# Patient Record
Sex: Female | Born: 1960 | Race: Black or African American | Hispanic: No | Smoking: Former smoker
Health system: Southern US, Community
[De-identification: ages and names within clinical notes are randomized; demographics above are authoritative.]

## PROBLEM LIST (undated history)

## (undated) ENCOUNTER — Emergency Department (HOSPITAL_COMMUNITY): Admission: EM | Payer: Self-pay | Source: Home / Self Care

## (undated) DIAGNOSIS — F329 Major depressive disorder, single episode, unspecified: Secondary | ICD-10-CM

## (undated) DIAGNOSIS — F32A Depression, unspecified: Secondary | ICD-10-CM

## (undated) DIAGNOSIS — M1990 Unspecified osteoarthritis, unspecified site: Secondary | ICD-10-CM

## (undated) DIAGNOSIS — E119 Type 2 diabetes mellitus without complications: Secondary | ICD-10-CM

## (undated) DIAGNOSIS — I10 Essential (primary) hypertension: Secondary | ICD-10-CM

## (undated) HISTORY — DX: Major depressive disorder, single episode, unspecified: F32.9

## (undated) HISTORY — DX: Unspecified osteoarthritis, unspecified site: M19.90

## (undated) HISTORY — DX: Depression, unspecified: F32.A

## (undated) HISTORY — DX: Type 2 diabetes mellitus without complications: E11.9

---

## 1999-10-24 ENCOUNTER — Encounter: Admission: RE | Admit: 1999-10-24 | Discharge: 2000-01-22 | Payer: Self-pay | Admitting: Family Medicine

## 2000-07-28 ENCOUNTER — Other Ambulatory Visit: Admission: RE | Admit: 2000-07-28 | Discharge: 2000-07-28 | Payer: Self-pay | Admitting: *Deleted

## 2001-09-09 ENCOUNTER — Other Ambulatory Visit: Admission: RE | Admit: 2001-09-09 | Discharge: 2001-09-09 | Payer: Self-pay | Admitting: *Deleted

## 2002-10-27 ENCOUNTER — Other Ambulatory Visit: Admission: RE | Admit: 2002-10-27 | Discharge: 2002-10-27 | Payer: Self-pay | Admitting: *Deleted

## 2006-10-01 ENCOUNTER — Emergency Department (HOSPITAL_COMMUNITY): Admission: EM | Admit: 2006-10-01 | Discharge: 2006-10-02 | Payer: Self-pay | Admitting: Emergency Medicine

## 2006-10-25 ENCOUNTER — Ambulatory Visit (HOSPITAL_COMMUNITY): Admission: RE | Admit: 2006-10-25 | Discharge: 2006-10-25 | Payer: Self-pay | Admitting: Family Medicine

## 2006-11-04 ENCOUNTER — Encounter: Admission: RE | Admit: 2006-11-04 | Discharge: 2007-02-02 | Payer: Self-pay | Admitting: Family Medicine

## 2007-05-16 ENCOUNTER — Emergency Department (HOSPITAL_COMMUNITY): Admission: EM | Admit: 2007-05-16 | Discharge: 2007-05-16 | Payer: Self-pay | Admitting: Family Medicine

## 2010-07-22 ENCOUNTER — Encounter: Payer: Self-pay | Admitting: Family Medicine

## 2016-02-28 ENCOUNTER — Ambulatory Visit (INDEPENDENT_AMBULATORY_CARE_PROVIDER_SITE_OTHER): Payer: 59 | Admitting: Physician Assistant

## 2016-02-28 ENCOUNTER — Ambulatory Visit (HOSPITAL_COMMUNITY)
Admission: RE | Admit: 2016-02-28 | Discharge: 2016-02-28 | Disposition: A | Discharge status: Home / Self Care | Payer: 59 | Source: Ambulatory Visit | Attending: Physician Assistant | Admitting: Physician Assistant

## 2016-02-28 VITALS — BP 144/80 | HR 100 | Temp 98.0°F | Resp 17 | Ht 64.75 in | Wt 146.0 lb

## 2016-02-28 DIAGNOSIS — R531 Weakness: Secondary | ICD-10-CM | POA: Insufficient documentation

## 2016-02-28 DIAGNOSIS — R1031 Right lower quadrant pain: Secondary | ICD-10-CM | POA: Insufficient documentation

## 2016-02-28 DIAGNOSIS — K381 Appendicular concretions: Secondary | ICD-10-CM | POA: Diagnosis not present

## 2016-02-28 DIAGNOSIS — R600 Localized edema: Secondary | ICD-10-CM | POA: Diagnosis not present

## 2016-02-28 DIAGNOSIS — R81 Glycosuria: Secondary | ICD-10-CM | POA: Diagnosis not present

## 2016-02-28 LAB — POCT CBC
GRANULOCYTE PERCENT: 81.5 % — AB (ref 37–80)
HEMATOCRIT: 40.6 % (ref 37.7–47.9)
Hemoglobin: 14.4 g/dL (ref 12.2–16.2)
Lymph, poc: 1.8 (ref 0.6–3.4)
MCH, POC: 30.5 pg (ref 27–31.2)
MCHC: 35.5 g/dL — AB (ref 31.8–35.4)
MCV: 85.8 fL (ref 80–97)
MID (CBC): 0.8 (ref 0–0.9)
MPV: 9.5 fL (ref 0–99.8)
POC GRANULOCYTE: 11.2 — AB (ref 2–6.9)
POC LYMPH %: 13 % (ref 10–50)
POC MID %: 5.5 % (ref 0–12)
Platelet Count, POC: 252 10*3/uL (ref 142–424)
RBC: 4.47 M/uL (ref 4.04–5.48)
RDW, POC: 12.4 %
WBC: 13.8 10*3/uL — AB (ref 4.6–10.2)

## 2016-02-28 LAB — POCT URINALYSIS DIP (MANUAL ENTRY)
Leukocytes, UA: NEGATIVE
Nitrite, UA: NEGATIVE
Protein Ur, POC: 100 — AB
SPEC GRAV UA: 1.015
Urobilinogen, UA: 1
pH, UA: 5.5

## 2016-02-28 LAB — BASIC METABOLIC PANEL
ANION GAP: 17 — AB (ref 5–15)
BUN: 24 mg/dL — ABNORMAL HIGH (ref 6–20)
CALCIUM: 10 mg/dL (ref 8.9–10.3)
CO2: 24 mmol/L (ref 22–32)
Chloride: 88 mmol/L — ABNORMAL LOW (ref 101–111)
Creatinine, Ser: 1.03 mg/dL — ABNORMAL HIGH (ref 0.44–1.00)
GFR, EST NON AFRICAN AMERICAN: 60 mL/min — AB (ref >60)
Glucose, Bld: 314 mg/dL — ABNORMAL HIGH (ref 65–99)
POTASSIUM: 3.4 mmol/L — AB (ref 3.5–5.1)
SODIUM: 129 mmol/L — AB (ref 135–145)

## 2016-02-28 LAB — POCT GLYCOSYLATED HEMOGLOBIN (HGB A1C)

## 2016-02-28 LAB — GLUCOSE, POCT (MANUAL RESULT ENTRY): POC Glucose: 297 mg/dl — AB (ref 70–99)

## 2016-02-28 LAB — POCT URINE PREGNANCY: PREG TEST UR: NEGATIVE

## 2016-02-28 MED ORDER — IOPAMIDOL (ISOVUE-300) INJECTION 61%
INTRAVENOUS | Status: AC
  Filled 2016-02-28: qty 30

## 2016-02-28 MED ORDER — IOPAMIDOL (ISOVUE-300) INJECTION 61%
INTRAVENOUS | Status: AC
  Administered 2016-02-28: 100 mL
  Filled 2016-02-28: qty 100

## 2016-02-28 NOTE — Patient Instructions (Addendum)
     IF you received an x-ray today, you will receive an invoice from [facility]. Please contact [facility] at [phone] with questions or concerns regarding your invoice.   IF you received labwork today, you will receive an invoice from [facility]. Please contact [facility] at [phone] with questions or concerns regarding your invoice.   Our billing staff will not be able to assist you with questions regarding bills from these companies.  You will be contacted with the lab results as soon as they are available. The fastest way to get your results is to activate your My Chart account. Instructions are located on the last page of this paperwork. If you have not heard from us regarding the results in 2 weeks, please contact this office.         IF you received an x-ray today, you will receive an invoice from [facility]. Please contact [facility] at [phone] with questions or concerns regarding your invoice.   IF you received labwork today, you will receive an invoice from [facility]. Please contact [facility] at [phone] with questions or concerns regarding your invoice.   Our billing staff will not be able to assist you with questions regarding bills from these companies.  You will be contacted with the lab results as soon as they are available. The fastest way to get your results is to activate your My Chart account. Instructions are located on the last page of this paperwork. If you have not heard from us regarding the results in 2 weeks, please contact this office.     We recommend that you schedule a mammogram for breast cancer screening. Typically, you do not need a referral to do this. Please contact a local imaging center to schedule your mammogram.  [facility] - [phone]  *ask for the Radiology Department [facility] - [phone]  or [phone]  [facility] - [phone] [facility] - [phone] [facility] - [phone]  *ask for the Radiology Department [facility] - [phone]  *ask for the Radiology Department [facility] - [phone]  *ask for the Mammography Department [facility] - [phone]  Please head directly to the [facility].  You will speak to registrar and say that you are here outpatient to have blood work and CT scan as scheduled.  Please head there immediately. I will contact you with the results of the blood work.

## 2016-02-28 NOTE — Progress Notes (Signed)
[facility] [address], [place] [address] [phone]  By signing my name below I, Rebecca Wagner, attest that this documentation has been prepared under the direction and in the presence of Rebecca PlattStephanie English PA. Electonically Signed. Rebecca Wagner, Scribe 02/28/2016 at 5:30 PM  Date:  02/28/2016   Name:  Rebecca MeuseValerie S Wagner   DOB:  1961-05-26   MRN:  [id number]  PCP:  No primary care provider on file.   Chief Complaint  Patient presents with   Diarrhea    and nausea.    Emesis    Sunday. Pt thinks she "ate a bad porkchop"   History of Present Illness:  Rebecca Wagner is a 55 y.o. female patient who [facility], diarrhea and emesis that began 3-4 days ago. Pt states she ate bad food, a pork chop from her son's school, 5 days ago; she woke up the following day with nausea and abdominal pain and developed emesis and diarrhea.  Son was not sick after eating same meal.  She states symptoms have improved, last episode of diarrhea and emesis was 2 days.  Neither are bloody.  Denies bilious emesis, or black stool.  She still has some nausea and right abdominal pain but also notes having fatigue and weakness. She states abdominal pain feels as if she is ovulating.  She has been drinking 3-4 glasses of water a day as well as Pedialyte. She's also taken Excedrin migraine which has helped.  She denies dysuria, urinary frequency and hematuria.  No fever.  No abnormal vaginal discharge.    No LMP recorded. Patient is postmenopausal.  ~2014.  No vaginal bleeding. No hx of appendectomy.   There are no active problems to display for this patient.   Past Medical History:  Diagnosis Date   Arthritis    Depression    Diabetes mellitus without complication (HCC)     Past Surgical History:  Procedure Laterality Date   CESAREAN SECTION      Social History  Substance Use Topics   Smoking status: Former Smoker   Smokeless tobacco: Not on file    Alcohol use Not on file    Family History  Problem Relation Age of Onset   Diabetes Mother    Heart disease Father     Not on File  Medication list has been reviewed and updated.  No current outpatient prescriptions on file prior to visit.   No current facility-administered medications on file prior to visit.     Review of Systems  Constitutional: Positive for malaise/fatigue. Negative for chills and fever.  Gastrointestinal: Positive for abdominal pain, diarrhea, nausea and vomiting. Negative for blood in stool, constipation and melena.  Genitourinary: Negative for dysuria, frequency, hematuria and urgency.   ROS unremarkable unless otherwise specified.  Physical Examination: BP (!) 142/80 (BP Location: Right Arm, Patient Position: Sitting, Cuff Size: Normal)    Pulse (!) 107    Temp 98 F (36.7 C) (Oral)    Resp 17    Ht 5' 4.75" (1.645 m)    Wt 146 lb (66.2 kg)    SpO2 99%    BMI 24.48 kg/m  Ideal Body Weight: @FLOWAMB ([id number])@[phone])@  Physical Exam  Constitutional: She is oriented to person, place, and time. She appears well-developed and well-nourished. No distress.  HENT:  Head: Normocephalic and atraumatic.  Right Ear: External ear normal.  Left Ear: External ear normal.  Eyes: Conjunctivae and EOM are normal. Pupils are equal,  round, and reactive to light.  Cardiovascular: Normal rate, regular rhythm and intact distal pulses.   Murmur heard. Pulmonary/Chest: Effort normal. No respiratory distress.  Abdominal: Soft. Normal appearance and bowel sounds are normal. There is tenderness in the right lower quadrant and suprapubic area. There is no guarding and no tenderness at McBurney's point.  Neurological: She is alert and oriented to person, place, and time.  Skin: She is not diaphoretic.  Psychiatric: She has a normal mood and affect. Her behavior is normal.   Results for orders placed or performed in visit on 02/28/16  POCT CBC  Result Value Ref Range   WBC  13.8 (A) 4.6 - 10.2 K/uL   Lymph, poc 1.8 0.6 - 3.4   POC LYMPH PERCENT 13.0 10 - 50 %L   MID (cbc) 0.8 0 - 0.9   POC MID % 5.5 0 - 12 %M   POC Granulocyte 11.2 (A) 2 - 6.9   Granulocyte percent 81.5 (A) 37 - 80 %G   RBC 4.47 4.04 - 5.48 M/uL   Hemoglobin 14.4 12.2 - 16.2 g/dL   HCT, POC 96.0 45.4 - 47.9 %   MCV 85.8 80 - 97 fL   MCH, POC 30.5 27 - 31.2 pg   MCHC 35.5 (A) 31.8 - 35.4 g/dL   RDW, POC 09.8 %   Platelet Count, POC 252 142 - 424 K/uL   MPV 9.5 0 - 99.8 fL  POCT urinalysis dipstick  Result Value Ref Range   Color, UA yellow yellow   Clarity, UA cloudy (A) clear   Glucose, UA >=1,000 (A) negative   Bilirubin, UA large (A) negative   Ketones, POC UA large (80) (A) negative   Spec Grav, UA 1.015    Blood, UA trace-lysed (A) negative   pH, UA 5.5    Protein Ur, POC =100 (A) negative   Urobilinogen, UA 1.0    Nitrite, UA Negative Negative   Leukocytes, UA Negative Negative  POCT glucose (manual entry)  Result Value Ref Range   POC Glucose 297 (A) 70 - 99 mg/dl  POCT glycosylated hemoglobin (Hb A1C)  Result Value Ref Range   Hemoglobin A1C =>14.0    Orthostatic VS for the past 24 hrs:  BP- Lying Pulse- Lying BP- Sitting Pulse- Sitting BP- Standing at 0 minutes Pulse- Standing at 0 minutes  02/28/16 1819 144/80 100 134/80 100 140/80 100   Assessment and Plan: Rebecca Wagner is a 55 y.o. female who is here today  --patient is here today for rlq pain, nausea, vomiting and diarrhea. --there is a possibility that this is appendicitis, though was unlikely.  I am scheduling [facility].  BMET ordered to obtain creatinine and BUN at hospital.  She will then have the CT with contrast.  Possible gastroenteritis, ovarian etiology, hernia. --advised to go straight to the ED.  She ask several times to eat or go home to change, take ibuprofen.  I have attempted to redirect her with risk of possible appendicitis. Discussed modifying blood sugar.  She has plans to  follow up with Rebecca Wagner as a pcp.  Takes novolog "as needed".  Does not take the levemir which was given to her, however she has not seen a provider in months, though is not directly answering my questions.  [facility] at this time to verify meds.  Complications discussed briefly with non-compliance.   She has left with friend waiting for her.    RLQ  abdominal pain - Plan: POCT CBC, Orthostatic vital signs, POCT urinalysis dipstick, CT Abdomen Pelvis W Contrast, POCT glucose (manual entry), Basic metabolic panel, POCT urine pregnancy, CANCELED: CT Abdomen Pelvis W Contrast  Weakness - Plan: Orthostatic vital signs, POCT urinalysis dipstick, CT Abdomen Pelvis W Contrast, POCT glycosylated hemoglobin (Hb A1C), Basic metabolic panel, POCT urine pregnancy, CANCELED: CT Abdomen Pelvis W Contrast  Glucosuria - Plan: POCT glucose (manual entry), POCT glycosylated hemoglobin (Hb A1C), Basic metabolic panel, POCT urine pregnancy   Rebecca Platt, PA-C [facility] 02/28/2016 7:30PM  Addendum, 4:30am: left brief message that I saw the report of appendicitis, etc..

## 2016-02-29 ENCOUNTER — Encounter (HOSPITAL_COMMUNITY): Payer: Self-pay | Admitting: Emergency Medicine

## 2016-02-29 ENCOUNTER — Observation Stay (HOSPITAL_COMMUNITY): Payer: 59 | Admitting: Anesthesiology

## 2016-02-29 ENCOUNTER — Observation Stay (HOSPITAL_COMMUNITY)
Admission: EM | Admit: 2016-02-29 | Discharge: 2016-03-02 | Disposition: A | Discharge status: Home / Self Care | Payer: 59 | Attending: Surgery | Admitting: Surgery

## 2016-02-29 ENCOUNTER — Encounter (HOSPITAL_COMMUNITY)
Admission: EM | Disposition: A | Discharge status: Home / Self Care | Payer: Self-pay | Source: Home / Self Care | Attending: Emergency Medicine

## 2016-02-29 ENCOUNTER — Telehealth: Payer: Self-pay | Admitting: Emergency Medicine

## 2016-02-29 DIAGNOSIS — R1031 Right lower quadrant pain: Secondary | ICD-10-CM | POA: Diagnosis not present

## 2016-02-29 DIAGNOSIS — R109 Unspecified abdominal pain: Secondary | ICD-10-CM | POA: Diagnosis present

## 2016-02-29 DIAGNOSIS — I10 Essential (primary) hypertension: Secondary | ICD-10-CM | POA: Insufficient documentation

## 2016-02-29 DIAGNOSIS — Z794 Long term (current) use of insulin: Secondary | ICD-10-CM | POA: Diagnosis not present

## 2016-02-29 DIAGNOSIS — Z87891 Personal history of nicotine dependence: Secondary | ICD-10-CM | POA: Diagnosis not present

## 2016-02-29 DIAGNOSIS — Z79899 Other long term (current) drug therapy: Secondary | ICD-10-CM | POA: Insufficient documentation

## 2016-02-29 DIAGNOSIS — E119 Type 2 diabetes mellitus without complications: Secondary | ICD-10-CM | POA: Insufficient documentation

## 2016-02-29 DIAGNOSIS — K353 Acute appendicitis with localized peritonitis: Secondary | ICD-10-CM | POA: Diagnosis not present

## 2016-02-29 DIAGNOSIS — K352 Acute appendicitis with generalized peritonitis: Secondary | ICD-10-CM | POA: Diagnosis not present

## 2016-02-29 DIAGNOSIS — K3580 Unspecified acute appendicitis: Secondary | ICD-10-CM | POA: Diagnosis present

## 2016-02-29 DIAGNOSIS — B370 Candidal stomatitis: Secondary | ICD-10-CM | POA: Diagnosis not present

## 2016-02-29 DIAGNOSIS — M1990 Unspecified osteoarthritis, unspecified site: Secondary | ICD-10-CM | POA: Diagnosis not present

## 2016-02-29 HISTORY — DX: Essential (primary) hypertension: I10

## 2016-02-29 HISTORY — PX: LAPAROSCOPIC APPENDECTOMY: SHX408

## 2016-02-29 LAB — GLUCOSE, CAPILLARY
GLUCOSE-CAPILLARY: 102 mg/dL — AB (ref 65–99)
GLUCOSE-CAPILLARY: 250 mg/dL — AB (ref 65–99)
Glucose-Capillary: 311 mg/dL — ABNORMAL HIGH (ref 65–99)
Glucose-Capillary: 171 mg/dL — ABNORMAL HIGH (ref 65–99)
Glucose-Capillary: 180 mg/dL — ABNORMAL HIGH (ref 65–99)
Glucose-Capillary: 234 mg/dL — ABNORMAL HIGH (ref 65–99)

## 2016-02-29 LAB — CBC WITH DIFFERENTIAL/PLATELET
BASOS ABS: 0 10*3/uL (ref 0.0–0.1)
Basophils Relative: 0 %
EOS ABS: 0 10*3/uL (ref 0.0–0.7)
Eosinophils Relative: 0 %
HCT: 40.3 % (ref 36.0–46.0)
HEMOGLOBIN: 13.5 g/dL (ref 12.0–15.0)
LYMPHS ABS: 1.1 10*3/uL (ref 0.7–4.0)
Lymphocytes Relative: 8 %
MCH: 29.2 pg (ref 26.0–34.0)
MCHC: 33.5 g/dL (ref 30.0–36.0)
MCV: 87 fL (ref 78.0–100.0)
Monocytes Absolute: 1.2 10*3/uL — ABNORMAL HIGH (ref 0.1–1.0)
Monocytes Relative: 8 %
NEUTROS PCT: 84 %
Neutro Abs: 12.3 10*3/uL — ABNORMAL HIGH (ref 1.7–7.7)
Platelets: 265 10*3/uL (ref 150–400)
RBC: 4.63 MIL/uL (ref 3.87–5.11)
RDW: 12.1 % (ref 11.5–15.5)
WBC: 14.7 10*3/uL — AB (ref 4.0–10.5)

## 2016-02-29 LAB — ABO/RH: ABO/RH(D): O POS

## 2016-02-29 LAB — CBC
HCT: 38.7 % (ref 36.0–46.0)
Hemoglobin: 13.1 g/dL (ref 12.0–15.0)
MCH: 29.2 pg (ref 26.0–34.0)
MCHC: 33.9 g/dL (ref 30.0–36.0)
MCV: 86.4 fL (ref 78.0–100.0)
Platelets: 257 10*3/uL (ref 150–400)
RBC: 4.48 MIL/uL (ref 3.87–5.11)
RDW: 12.4 % (ref 11.5–15.5)
WBC: 13.6 10*3/uL — ABNORMAL HIGH (ref 4.0–10.5)

## 2016-02-29 LAB — COMPREHENSIVE METABOLIC PANEL
ALBUMIN: 3.3 g/dL — AB (ref 3.5–5.0)
ALK PHOS: 71 U/L (ref 38–126)
ALT: 11 U/L — AB (ref 14–54)
AST: 13 U/L — AB (ref 15–41)
Anion gap: 17 — ABNORMAL HIGH (ref 5–15)
BUN: 20 mg/dL (ref 6–20)
CALCIUM: 9.3 mg/dL (ref 8.9–10.3)
CO2: 20 mmol/L — AB (ref 22–32)
CREATININE: 0.92 mg/dL (ref 0.44–1.00)
Chloride: 90 mmol/L — ABNORMAL LOW (ref 101–111)
GFR calc Af Amer: >60 mL/min (ref >60)
GFR calc non Af Amer: >60 mL/min (ref >60)
GLUCOSE: 331 mg/dL — AB (ref 65–99)
Potassium: 3.3 mmol/L — ABNORMAL LOW (ref 3.5–5.1)
SODIUM: 127 mmol/L — AB (ref 135–145)
Total Bilirubin: 1 mg/dL (ref 0.3–1.2)
Total Protein: 7 g/dL (ref 6.5–8.1)

## 2016-02-29 LAB — CREATININE, SERUM
Creatinine, Ser: 0.93 mg/dL (ref 0.44–1.00)
GFR calc Af Amer: >60 mL/min (ref >60)
GFR calc non Af Amer: >60 mL/min (ref >60)

## 2016-02-29 LAB — SURGICAL PCR SCREEN
MRSA, PCR: NEGATIVE
Staphylococcus aureus: NEGATIVE

## 2016-02-29 LAB — CBG MONITORING, ED: GLUCOSE-CAPILLARY: 317 mg/dL — AB (ref 65–99)

## 2016-02-29 LAB — TYPE AND SCREEN
ABO/RH(D): O POS
Antibody Screen: NEGATIVE

## 2016-02-29 SURGERY — APPENDECTOMY, LAPAROSCOPIC
Anesthesia: General | Site: Abdomen

## 2016-02-29 MED ORDER — BUPIVACAINE HCL 0.25 % IJ SOLN
INTRAMUSCULAR | Status: DC | PRN
  Administered 2016-02-29: 5 mL

## 2016-02-29 MED ORDER — LIDOCAINE HCL (CARDIAC) 20 MG/ML IV SOLN
INTRAVENOUS | Status: DC | PRN
Start: 2016-02-29 — End: 2016-02-29
  Administered 2016-02-29: 80 mg via INTRAVENOUS

## 2016-02-29 MED ORDER — MORPHINE SULFATE (PF) 2 MG/ML IV SOLN
2.0000 mg | INTRAVENOUS | Status: DC | PRN
  Administered 2016-02-29: 2 mg via INTRAVENOUS
  Filled 2016-02-29 (×2): qty 1

## 2016-02-29 MED ORDER — LIDOCAINE 2% (20 MG/ML) 5 ML SYRINGE
INTRAMUSCULAR | Status: AC
  Filled 2016-02-29: qty 5

## 2016-02-29 MED ORDER — ONDANSETRON 4 MG PO TBDP
4.0000 mg | ORAL_TABLET | Freq: Four times a day (QID) | ORAL | Status: DC | PRN
  Administered 2016-03-02: 4 mg via ORAL
  Filled 2016-02-29: qty 1

## 2016-02-29 MED ORDER — LACTATED RINGERS IV SOLN
INTRAVENOUS | Status: DC
  Administered 2016-02-29: 13:00:00 via INTRAVENOUS

## 2016-02-29 MED ORDER — SODIUM CHLORIDE 0.9 % IV SOLN
INTRAVENOUS | Status: DC
  Administered 2016-02-29 – 2016-03-02 (×3): via INTRAVENOUS

## 2016-02-29 MED ORDER — FENTANYL CITRATE (PF) 100 MCG/2ML IJ SOLN
INTRAMUSCULAR | Status: DC | PRN
  Administered 2016-02-29 (×3): 50 ug via INTRAVENOUS

## 2016-02-29 MED ORDER — METHOCARBAMOL 750 MG PO TABS: 750.0000 mg | ORAL_TABLET | Freq: Three times a day (TID) | ORAL | Status: DC | PRN

## 2016-02-29 MED ORDER — LACTATED RINGERS IV SOLN
INTRAVENOUS | Status: DC | PRN
  Administered 2016-02-29 (×3): via INTRAVENOUS

## 2016-02-29 MED ORDER — ROCURONIUM BROMIDE 100 MG/10ML IV SOLN
INTRAVENOUS | Status: DC | PRN
  Administered 2016-02-29: 20 mg via INTRAVENOUS
  Administered 2016-02-29: 30 mg via INTRAVENOUS

## 2016-02-29 MED ORDER — ONDANSETRON HCL 4 MG/2ML IJ SOLN
4.0000 mg | Freq: Four times a day (QID) | INTRAMUSCULAR | Status: DC | PRN
  Administered 2016-02-29 – 2016-03-02 (×5): 4 mg via INTRAVENOUS
  Filled 2016-02-29 (×5): qty 2

## 2016-02-29 MED ORDER — MIDAZOLAM HCL 2 MG/2ML IJ SOLN
INTRAMUSCULAR | Status: AC
  Filled 2016-02-29: qty 2

## 2016-02-29 MED ORDER — PROPOFOL 10 MG/ML IV BOLUS
INTRAVENOUS | Status: DC | PRN
  Administered 2016-02-29: 170 mg via INTRAVENOUS

## 2016-02-29 MED ORDER — METRONIDAZOLE IN NACL 5-0.79 MG/ML-% IV SOLN
500.0000 mg | Freq: Three times a day (TID) | INTRAVENOUS | Status: DC
  Administered 2016-02-29 – 2016-03-02 (×6): 500 mg via INTRAVENOUS
  Filled 2016-02-29 (×10): qty 100

## 2016-02-29 MED ORDER — DEXTROSE 5 % IV SOLN
1.0000 g | Freq: Once | INTRAVENOUS | Status: AC
  Administered 2016-02-29: 1 g via INTRAVENOUS
  Filled 2016-02-29: qty 10

## 2016-02-29 MED ORDER — ENOXAPARIN SODIUM 40 MG/0.4ML ~~LOC~~ SOLN: 40.0000 mg | SUBCUTANEOUS | Status: DC

## 2016-02-29 MED ORDER — ONDANSETRON HCL 4 MG/2ML IJ SOLN
INTRAMUSCULAR | Status: DC | PRN
  Administered 2016-02-29: 4 mg via INTRAVENOUS

## 2016-02-29 MED ORDER — SODIUM CHLORIDE 0.9 % IV SOLN
INTRAVENOUS | Status: DC
Start: 2016-02-29 — End: 2016-02-29
  Administered 2016-02-29: 05:00:00 via INTRAVENOUS

## 2016-02-29 MED ORDER — DEXTROSE 5 % IV SOLN
2.0000 g | INTRAVENOUS | Status: DC
  Administered 2016-03-01 – 2016-03-02 (×2): 2 g via INTRAVENOUS
  Filled 2016-02-29 (×3): qty 2

## 2016-02-29 MED ORDER — DIPHENHYDRAMINE HCL 50 MG/ML IJ SOLN: 25.0000 mg | Freq: Four times a day (QID) | INTRAMUSCULAR | Status: DC | PRN

## 2016-02-29 MED ORDER — OXYCODONE-ACETAMINOPHEN 5-325 MG PO TABS
1.0000 | ORAL_TABLET | ORAL | Status: DC | PRN
  Administered 2016-02-29: 2 via ORAL
  Administered 2016-03-01: 1 via ORAL
  Administered 2016-03-01 – 2016-03-02 (×4): 2 via ORAL
  Filled 2016-02-29 (×6): qty 2

## 2016-02-29 MED ORDER — METRONIDAZOLE IN NACL 5-0.79 MG/ML-% IV SOLN
500.0000 mg | Freq: Once | INTRAVENOUS | Status: AC
Start: 2016-02-29 — End: 2016-02-29
  Administered 2016-02-29: 500 mg via INTRAVENOUS
  Filled 2016-02-29: qty 100

## 2016-02-29 MED ORDER — DEXAMETHASONE SODIUM PHOSPHATE 10 MG/ML IJ SOLN
INTRAMUSCULAR | Status: DC | PRN
  Administered 2016-02-29: 10 mg via INTRAVENOUS

## 2016-02-29 MED ORDER — MIDAZOLAM HCL 5 MG/5ML IJ SOLN
INTRAMUSCULAR | Status: DC | PRN
  Administered 2016-02-29: 2 mg via INTRAVENOUS

## 2016-02-29 MED ORDER — SUCCINYLCHOLINE CHLORIDE 20 MG/ML IJ SOLN
INTRAMUSCULAR | Status: DC | PRN
  Administered 2016-02-29: 120 mg via INTRAVENOUS

## 2016-02-29 MED ORDER — POTASSIUM CHLORIDE CRYS ER 20 MEQ PO TBCR
40.0000 meq | EXTENDED_RELEASE_TABLET | Freq: Once | ORAL | Status: AC
  Administered 2016-02-29: 40 meq via ORAL
  Filled 2016-02-29: qty 2

## 2016-02-29 MED ORDER — ENOXAPARIN SODIUM 40 MG/0.4ML ~~LOC~~ SOLN
40.0000 mg | SUBCUTANEOUS | Status: DC
  Administered 2016-03-01 – 2016-03-02 (×2): 40 mg via SUBCUTANEOUS
  Filled 2016-02-29 (×2): qty 0.4

## 2016-02-29 MED ORDER — SUGAMMADEX SODIUM 200 MG/2ML IV SOLN
INTRAVENOUS | Status: DC | PRN
  Administered 2016-02-29: 200 mg via INTRAVENOUS

## 2016-02-29 MED ORDER — SODIUM CHLORIDE 0.9 % IR SOLN
Status: DC | PRN
  Administered 2016-02-29: 1000 mL

## 2016-02-29 MED ORDER — DIPHENHYDRAMINE HCL 25 MG PO CAPS: 25.0000 mg | ORAL_CAPSULE | Freq: Four times a day (QID) | ORAL | Status: DC | PRN

## 2016-02-29 MED ORDER — PROMETHAZINE HCL 25 MG/ML IJ SOLN: 6.2500 mg | INTRAMUSCULAR | Status: DC | PRN

## 2016-02-29 MED ORDER — SODIUM CHLORIDE 0.9 % IV SOLN
INTRAVENOUS | Status: DC
Start: 2016-02-29 — End: 2016-02-29
  Administered 2016-02-29: 03:00:00 via INTRAVENOUS

## 2016-02-29 MED ORDER — HYDROMORPHONE HCL 1 MG/ML IJ SOLN
0.2500 mg | INTRAMUSCULAR | Status: DC | PRN
  Administered 2016-02-29 (×4): 0.5 mg via INTRAVENOUS

## 2016-02-29 MED ORDER — HYDROMORPHONE HCL 1 MG/ML IJ SOLN
INTRAMUSCULAR | Status: AC
  Administered 2016-02-29: 0.5 mg via INTRAVENOUS
  Filled 2016-02-29: qty 1

## 2016-02-29 MED ORDER — PROPOFOL 10 MG/ML IV BOLUS
INTRAVENOUS | Status: AC
  Filled 2016-02-29: qty 20

## 2016-02-29 MED ORDER — LABETALOL HCL 5 MG/ML IV SOLN
INTRAVENOUS | Status: DC | PRN
  Administered 2016-02-29 (×4): 5 mg via INTRAVENOUS

## 2016-02-29 MED ORDER — INSULIN ASPART 100 UNIT/ML ~~LOC~~ SOLN
0.0000 [IU] | SUBCUTANEOUS | Status: DC
  Administered 2016-02-29: 11 [IU] via SUBCUTANEOUS
  Administered 2016-02-29: 3 [IU] via SUBCUTANEOUS
  Administered 2016-02-29 (×2): 5 [IU] via SUBCUTANEOUS
  Administered 2016-03-01: 3 [IU] via SUBCUTANEOUS
  Administered 2016-03-01 (×2): 5 [IU] via SUBCUTANEOUS

## 2016-02-29 MED ORDER — FENTANYL CITRATE (PF) 100 MCG/2ML IJ SOLN: 50.0000 ug | INTRAMUSCULAR | Status: DC | PRN

## 2016-02-29 MED ORDER — 0.9 % SODIUM CHLORIDE (POUR BTL) OPTIME
TOPICAL | Status: DC | PRN
  Administered 2016-02-29: 1000 mL

## 2016-02-29 MED ORDER — ONDANSETRON HCL 4 MG/2ML IJ SOLN
4.0000 mg | Freq: Four times a day (QID) | INTRAMUSCULAR | Status: DC | PRN
  Administered 2016-02-29: 4 mg via INTRAVENOUS
  Filled 2016-02-29: qty 2

## 2016-02-29 MED ORDER — BUPIVACAINE HCL (PF) 0.25 % IJ SOLN
INTRAMUSCULAR | Status: AC
  Filled 2016-02-29: qty 30

## 2016-02-29 MED ORDER — FENTANYL CITRATE (PF) 100 MCG/2ML IJ SOLN
INTRAMUSCULAR | Status: AC
  Filled 2016-02-29: qty 4

## 2016-02-29 SURGICAL SUPPLY — 42 items
APL SKNCLS STERI-STRIP NONHPOA (GAUZE/BANDAGES/DRESSINGS) ×1
BAG SPEC RTRVL 10 TROC 200 (ENDOMECHANICALS) ×1
BENZOIN TINCTURE PRP APPL 2/3 (GAUZE/BANDAGES/DRESSINGS) ×2 IMPLANT
CANISTER SUCTION 2500CC (MISCELLANEOUS) ×2 IMPLANT
CHLORAPREP W/TINT 26ML (MISCELLANEOUS) ×2 IMPLANT
COVER SURGICAL LIGHT HANDLE (MISCELLANEOUS) ×2 IMPLANT
COVER TRANSDUCER ULTRASND (DRAPES) ×2 IMPLANT
DEVICE TROCAR PUNCTURE CLOSURE (ENDOMECHANICALS) ×2 IMPLANT
ELECT REM PT RETURN 9FT ADLT (ELECTROSURGICAL) ×2
ELECTRODE REM PT RTRN 9FT ADLT (ELECTROSURGICAL) ×1 IMPLANT
ENDOLOOP SUT PDS II  0 18 (SUTURE) ×3
ENDOLOOP SUT PDS II 0 18 (SUTURE) ×3 IMPLANT
GAUZE SPONGE 2X2 8PLY STRL LF (GAUZE/BANDAGES/DRESSINGS) IMPLANT
GLOVE BIO SURGEON STRL SZ7.5 (GLOVE) ×2 IMPLANT
GOWN STRL REUS W/ TWL LRG LVL3 (GOWN DISPOSABLE) ×2 IMPLANT
GOWN STRL REUS W/ TWL XL LVL3 (GOWN DISPOSABLE) ×1 IMPLANT
GOWN STRL REUS W/TWL LRG LVL3 (GOWN DISPOSABLE) ×4
GOWN STRL REUS W/TWL XL LVL3 (GOWN DISPOSABLE) ×2
KIT BASIN OR (CUSTOM PROCEDURE TRAY) ×2 IMPLANT
KIT ROOM TURNOVER OR (KITS) ×2 IMPLANT
NDL INSUFFLATION 14GA 120MM (NEEDLE) ×1 IMPLANT
NEEDLE INSUFFLATION 14GA 120MM (NEEDLE) ×2 IMPLANT
NS IRRIG 1000ML POUR BTL (IV SOLUTION) ×2 IMPLANT
PAD ARMBOARD 7.5X6 YLW CONV (MISCELLANEOUS) ×4 IMPLANT
POUCH RETRIEVAL ECOSAC 10 (ENDOMECHANICALS) IMPLANT
POUCH RETRIEVAL ECOSAC 10MM (ENDOMECHANICALS) ×1
SCISSORS LAP 5X35 DISP (ENDOMECHANICALS) ×2 IMPLANT
SET IRRIG TUBING LAPAROSCOPIC (IRRIGATION / IRRIGATOR) ×2 IMPLANT
SLEEVE ENDOPATH XCEL 5M (ENDOMECHANICALS) ×2 IMPLANT
SPECIMEN JAR SMALL (MISCELLANEOUS) ×2 IMPLANT
SPONGE GAUZE 2X2 STER 10/PKG (GAUZE/BANDAGES/DRESSINGS) ×1
STRIP CLOSURE SKIN 1/2X4 (GAUZE/BANDAGES/DRESSINGS) ×1 IMPLANT
SUT MNCRL AB 3-0 PS2 18 (SUTURE) ×2 IMPLANT
SUT SILK 2 0 SH (SUTURE) ×1 IMPLANT
TAPE CLOTH SURG 4X10 WHT LF (GAUZE/BANDAGES/DRESSINGS) ×1 IMPLANT
TOWEL OR 17X24 6PK STRL BLUE (TOWEL DISPOSABLE) ×2 IMPLANT
TOWEL OR 17X26 10 PK STRL BLUE (TOWEL DISPOSABLE) ×2 IMPLANT
TRAY FOLEY CATH 16FR SILVER (SET/KITS/TRAYS/PACK) ×2 IMPLANT
TRAY LAPAROSCOPIC MC (CUSTOM PROCEDURE TRAY) ×2 IMPLANT
TROCAR XCEL NON-BLD 11X100MML (ENDOMECHANICALS) ×2 IMPLANT
TROCAR XCEL NON-BLD 5MMX100MML (ENDOMECHANICALS) ×2 IMPLANT
TUBING INSUFFLATION (TUBING) ×2 IMPLANT

## 2016-02-29 NOTE — Anesthesia Preprocedure Evaluation (Addendum)
Anesthesia Evaluation  Patient identified by MRN, date of birth, ID band Patient awake    Reviewed: Allergy & Precautions, NPO status , Patient's Chart, lab work & pertinent test results  History of Anesthesia Complications Negative for: history of anesthetic complications  Airway Mallampati: II  TM Distance: >3 FB Neck ROM: Full    Dental no notable dental hx. (+) Dental Advisory Given   Pulmonary former smoker,    Pulmonary exam normal        Cardiovascular hypertension, Normal cardiovascular exam     Neuro/Psych PSYCHIATRIC DISORDERS Depression negative neurological ROS     GI/Hepatic negative GI ROS, Neg liver ROS,   Endo/Other  diabetes  Renal/GU negative Renal ROS     Musculoskeletal   Abdominal   Peds  Hematology negative hematology ROS (+)   Anesthesia Other Findings   Reproductive/Obstetrics                             Anesthesia Physical Anesthesia Plan  ASA: II and emergent  Anesthesia Plan: General   Post-op Pain Management:    Induction: Intravenous, Rapid sequence and Cricoid pressure planned  Airway Management Planned: Oral ETT  Additional Equipment:   Intra-op Plan:   Post-operative Plan: Extubation in OR  Informed Consent: I have reviewed the patients History and Physical, chart, labs and discussed the procedure including the risks, benefits and alternatives for the proposed anesthesia with the patient or authorized representative who has indicated his/her understanding and acceptance.   Dental advisory given  Plan Discussed with: CRNA, Anesthesiologist and Surgeon  Anesthesia Plan Comments:        Anesthesia Quick Evaluation

## 2016-02-29 NOTE — ED Triage Notes (Signed)
Patient here from CT, positive CT for appendicitis.  Patient has had nausea and lower abdominal pain since Saturday.  Patient is CAOx4, afebrile.

## 2016-02-29 NOTE — Telephone Encounter (Signed)
thanks

## 2016-02-29 NOTE — Anesthesia Procedure Notes (Addendum)
Procedure Name: Intubation Date/Time: 02/29/2016 12:18 PM Performed by: Geraldo DockerSOLHEIM, Zaila Crew SALOMON Pre-anesthesia Checklist: Patient identified, Patient being monitored, Timeout performed, Emergency Drugs available and Suction available Patient Re-evaluated:Patient Re-evaluated prior to inductionOxygen Delivery Method: Circle System Utilized Preoxygenation: Pre-oxygenation with 100% oxygen Intubation Type: IV induction, Cricoid Pressure applied and Rapid sequence Laryngoscope Size: Mac and 4 Grade View: Grade IV Tube type: Oral Tube size: 7.0 mm Number of attempts: 2 Airway Equipment and Method: Stylet Placement Confirmation: ETT inserted through vocal cords under direct vision,  positive ETCO2 and breath sounds checked- equal and bilateral Secured at: 22 cm Tube secured with: Tape Dental Injury: Teeth and Oropharynx as per pre-operative assessment  Difficulty Due To: Difficulty was unanticipated, Difficult Airway- due to anterior larynx and Difficult Airway- due to large tongue Future Recommendations: Recommend- induction with short-acting agent, and alternative techniques readily available Comments: First DL with miller 3 --> gr 3 view with esophageal intubation. Immediately recognized and removed. Second DL with MAC 4 --> grade 4 view and subsequent AOI. Stomach decompressed post intubation. Would recommend glidescope readily available for future intubations.

## 2016-02-29 NOTE — Anesthesia Postprocedure Evaluation (Signed)
Anesthesia Post Note  Patient: Rebecca MeuseValerie S Wagner  Procedure(s) Performed: Procedure(s) (LRB): LAPAROSCOPIC APPENDECTOMY (N/A)  Patient location during evaluation: PACU Anesthesia Type: General Level of consciousness: sedated Pain management: pain level controlled Vital Signs Assessment: post-procedure vital signs reviewed and stable Respiratory status: spontaneous breathing and respiratory function stable Cardiovascular status: stable Anesthetic complications: no    Last Vitals:  Vitals:   02/29/16 1536 02/29/16 1545  BP:  (!) 166/87  Pulse: 80 82  Resp: 13 12  Temp:  36.5 C    Last Pain:  Vitals:   02/29/16 1536  TempSrc:   PainSc: Asleep                 Leviathan Macera DANIEL

## 2016-02-29 NOTE — ED Provider Notes (Signed)
By signing my name below, I, Emmanuella Mensah, attest that this documentation has been prepared under the direction and in the presence of Kamilya Wakeman N Ilir Mahrt, DO. Electronically Signed: Angelene GiovanniEmmanuella Mensah, ED Scribe. 02/29/16. 2:05 AM.   TIME SEEN: 1:57 AM   CHIEF COMPLAINT: Abdominal pain   HPI:  Rebecca Wagner is a 55 y.o. female with a hx of hypertension and DM who presents to the Emergency Department complaining of ongoing gradually improving moderate lower abdominal pain onset 5 days ago. She reports associated nausea and multiple episodes of non-bloody vomiting 3 days ago. She states that she has tried Excedrin for relief. She reports a hx of C-section. [facility] and sent here for a CT which revealed appendicitis. She reports NKDA. No anti-coagulant use. She denies any fever, chills, or diarrhea.    ROS: See HPI Constitutional: no fever  Eyes: no drainage  ENT: no runny nose   Cardiovascular:  no chest pain  Resp: no SOB  GI: vomiting GU: no dysuria Integumentary: no rash  Allergy: no hives  Musculoskeletal: no leg swelling  Neurological: no slurred speech ROS otherwise negative  PAST MEDICAL HISTORY/PAST SURGICAL HISTORY:  Past Medical History:  Diagnosis Date  . Arthritis   . Depression   . Diabetes mellitus without complication (HCC)   . Hypertension     MEDICATIONS:  Prior to Admission medications   Medication Sig Start Date End Date Taking? Authorizing Provider  insulin aspart (NOVOLOG) 100 UNIT/ML injection Inject into the skin 3 (three) times daily before meals.    Historical Provider, MD    ALLERGIES:  No Known Allergies  SOCIAL HISTORY:  Social History  Substance Use Topics  . Smoking status: Former Games developermoker  . Smokeless tobacco: Never Used  . Alcohol use Not on file    FAMILY HISTORY: Family History  Problem Relation Age of Onset  . Diabetes Mother   . Heart disease Father     EXAM: BP 160/91 (BP Location: Right  Arm)   Pulse 113   Temp 98.4 F (36.9 C) (Oral)   Resp 19   SpO2 100%  CONSTITUTIONAL: Alert and oriented and responds appropriately to questions. Well-appearing; well-nourished HEAD: Normocephalic EYES: Conjunctivae clear, PERRL ENT: normal nose; no rhinorrhea; moist mucous membranes NECK: Supple, no meningismus, no LAD  CARD: RRR; S1 and S2 appreciated; no murmurs, no clicks, no rubs, no gallops. Regular and tachycardiac  RESP: Normal chest excursion without splinting or tachypnea; breath sounds clear and equal bilaterally; no wheezes, no rhonchi, no rales, no hypoxia or respiratory distress, speaking full sentences ABD/GI: Normal bowel sounds; non-distended; soft, non-tender, no rebound, no guarding, no peritoneal signs BACK:  The back appears normal and is non-tender to palpation, there is no CVA tenderness EXT: Normal ROM in all joints; non-tender to palpation; no edema; normal capillary refill; no cyanosis, no calf tenderness or swelling    SKIN: Normal color for age and race; warm; no rash NEURO: Moves all extremities equally, sensation to light touch intact diffusely, cranial nerves II through XII intact PSYCH: The patient's mood and manner are appropriate. Grooming and personal hygiene are appropriate.  MEDICAL DECISION MAKING: Patient here with appendicitis. CT scan shows no perforation or abscess. Labs done earlier yesterday showed a leukocytosis of 13.8 and mild hyperglycemia without DKA. We'll repeat lab work, urine today. We'll give IV ceftriaxone, Flagyl. We'll discuss with surgery on call. She declines any pain medication at this time. We'll keep her NPO and give  IV fluids.  ED PROGRESS: 2:15 AM  D/w Dr. Corliss Skains who will see in ED.   I personally performed the services described in this documentation, which was scribed in my presence. The recorded information has been reviewed and is accurate.    Layla Maw Mele Sylvester, DO 02/29/16 [phone]

## 2016-02-29 NOTE — Progress Notes (Signed)
Inpatient Diabetes Program Recommendations  AACE/ADA: New Consensus Statement on Inpatient Glycemic Control (2015)  Target Ranges:  Prepandial:   less than 140 mg/dL      Peak postprandial:   less than 180 mg/dL (1-2 hours)      Critically ill patients:  140 - 180 mg/dL  Results for Rebecca Wagner, Rebecca Wagner (MRN [id number]) as of 02/29/2016 12:49  Ref. Range 02/29/2016 01:10 02/29/2016 05:12 02/29/2016 07:36 02/29/2016 11:44  Glucose-Capillary Latest Ref Range: 65 - 99 mg/dL 782317 (H) 956311 (H) 213171 (H) 102 (H)   Results for Rebecca Wagner, Rebecca Wagner (MRN [id number]) as of 02/29/2016 12:49  Ref. Range 02/28/2016 18:32 02/28/2016 20:54 02/29/2016 02:47  Hemoglobin A1C Unknown =>14.0    Glucose Latest Ref Range: 65 - 99 mg/dL  629314 (H) 528331 (H)  POC Glucose Latest Ref Range: 70 - 99 mg/dl 413297 (A)     Review of Glycemic Control  Diabetes history: DM2 Outpatient Diabetes medications: Novolog 5-10 units TID with meals when needed for high blood sugar Current orders for Inpatient glycemic control: Novolog 0-15 units Q4H  Inpatient Diabetes Program Recommendations: HgbA1C: A1C >14.0% on 02/28/16. Noted in office note by Trena PlattStephanie English, PA (from [facility]) dated 02/28/16 states "Discussed modifying blood sugar.  She has plans to follow up with Haskel SchroederBetty Reese as a pcp.  Takes novolog "as needed".  Does not take the levemir which was given to her, however she has not seen a provider in months." MD, please re-evaluate oupatient DM medications. Patient needs to follow up with PCP or Endocrinologist regarding improving glycemic control.  Thanks, Orlando PennerMarie Cerita Rabelo, RN, MSN, CDE Diabetes Coordinator Inpatient Diabetes Program [phone] (Team Pager from 8am to 5pm) [phone] ([facility] office) [phone] [facility] office) [phone] [facility] office)

## 2016-02-29 NOTE — Transfer of Care (Signed)
Immediate Anesthesia Transfer of Care Note  Patient: Rebecca Wagner  Procedure(s) Performed: Procedure(s): LAPAROSCOPIC APPENDECTOMY (N/A)  Patient Location: PACU  Anesthesia Type:General  Level of Consciousness: awake, oriented and patient cooperative  Airway & Oxygen Therapy: Patient Spontanous Breathing and Patient connected to nasal cannula oxygen  Post-op Assessment: Report given to RN and Post -op Vital signs reviewed and stable  Post vital signs: Reviewed  Last Vitals:  Vitals:   02/29/16 0937 02/29/16 1442  BP: 112/77   Pulse: (!) 102   Resp: 20   Temp: 37.1 C 36.6 C    Last Pain:  Vitals:   02/29/16 1117  TempSrc:   PainSc: 0-No pain      Patients Stated Pain Goal: 3 (02/29/16 1040)  Complications: No apparent anesthesia complications

## 2016-02-29 NOTE — Telephone Encounter (Signed)
Radiologist sent paiient to the ED for appendicitis. I spoke with patient also.

## 2016-02-29 NOTE — H&P (Signed)
Rebecca MeuseValerie S Wagner is an 55 y.o. female.   Chief Complaint: RLQ abdominal pain HPI: This is a 55 yo female who works as a Engineer, civil (consulting)nurse at [employer] who presents with three days of RLQ abdominal pain, nausea, vomiting, and diarrhea.  She states that she actually feels a little bit better today, but she went to Urgent Care because of the persistent pain.  She was sent for a CT scan which showed acute appendicitis with no sign of abscess.  She took some ibuprofen earlier this evening which has improved her pain.  Past Medical History:  Diagnosis Date  . Arthritis   . Depression   . Diabetes mellitus without complication (HCC)   . Hypertension     Past Surgical History:  Procedure Laterality Date  . CESAREAN SECTION      Family History  Problem Relation Age of Onset  . Diabetes Mother   . Heart disease Father    Social History:  reports that she has quit smoking. She has never used smokeless tobacco. Her alcohol and drug histories are not on file.  Allergies: No Known Allergies  Prior to Admission medications   Medication Sig Start Date End Date Taking? Authorizing Provider  CALCIUM-MAGNESIUM-ZINC PO Take 1 tablet by mouth daily.   Yes Historical Provider, MD  insulin aspart (NOVOLOG) 100 UNIT/ML injection Inject 5-10 Units into the skin 3 (three) times daily as needed for high blood sugar.    Yes Historical Provider, MD  Lactobacillus (ACIDOPHILUS PO) Take 1-2 tablets by mouth daily.   Yes Historical Provider, MD  Magnesium 250 MG TABS Take 1 tablet by mouth daily.   Yes Historical Provider, MD     Results for orders placed or performed during the hospital encounter of 02/29/16 (from the past 48 hour(s))  POC CBG, ED     Status: Abnormal   Collection Time: 02/29/16  1:10 AM  Result Value Ref Range   Glucose-Capillary 317 (H) 65 - 99 mg/dL  Type and screen     Status: None (Preliminary result)   Collection Time: 02/29/16  2:45 AM  Result Value Ref Range   ABO/RH(D) O POS     Antibody Screen PENDING    Sample Expiration 03/03/2016   CBC with Differential     Status: Abnormal   Collection Time: 02/29/16  2:47 AM  Result Value Ref Range   WBC 14.7 (H) 4.0 - 10.5 K/uL   RBC 4.63 3.87 - 5.11 MIL/uL   Hemoglobin 13.5 12.0 - 15.0 g/dL   HCT 91.440.3 78.236.0 - 95.646.0 %   MCV 87.0 78.0 - 100.0 fL   MCH 29.2 26.0 - 34.0 pg   MCHC 33.5 30.0 - 36.0 g/dL   RDW 21.312.1 08.611.5 - 57.815.5 %   Platelets 265 150 - 400 K/uL   Neutrophils Relative % 84 %   Neutro Abs 12.3 (H) 1.7 - 7.7 K/uL   Lymphocytes Relative 8 %   Lymphs Abs 1.1 0.7 - 4.0 K/uL   Monocytes Relative 8 %   Monocytes Absolute 1.2 (H) 0.1 - 1.0 K/uL   Eosinophils Relative 0 %   Eosinophils Absolute 0.0 0.0 - 0.7 K/uL   Basophils Relative 0 %   Basophils Absolute 0.0 0.0 - 0.1 K/uL   Ct Abdomen Pelvis W Contrast  Addendum Date: 02/29/2016   ADDENDUM REPORT: 02/29/2016 01:39 ADDENDUM: Discussed the findings on 02/29/2016 at 1:36 am with Dr. Cleta Albertsaub from Urgent Care, who called in to follow up on the patient. Electronically Signed  By: William  Stevens M.D.   On: 02/29/2016 01:39   Addendum Date: 02/29/2016   ADDENDUM REPORT: 02/29/2016 01:06 ADDENDUM: I discussed the CT findings with the patient and we are sending her over to the ED for further evaluation and management. 0106 hours on 02/29/16 Electronically Signed   By: William  Stevens M.D.   On: 02/29/2016 01:06   Result Date: 02/29/2016 CLINICAL DATA:  Right lower quadrant pain and weakness for 4 days. Nausea and vomiting. EXAM: CT ABDOMEN AND PELVIS WITH CONTRAST TECHNIQUE: Multidetector CT imaging of the abdomen and pelvis was performed using the standard protocol following bolus administration of intravenous contrast. CONTRAST:  <MEASUREMENT301 Anne Arund3114 RMarMount Sinai Hospital - Mount Sinai Hospital Of QueensRaeforMichBaptist Memorial Hospital - Union CityaGeorgina 1864 Carris Health7997 AMarMayo Clinic Hospital Methodist CampusRaeforMichMemorial HospitalaGeorgina 2717-Harrison Co4662 MarInstituto Cirugia Plastica Del Oeste IncRaeforMichSpecialty Surgery Center LLCaGeorgina [phone] S. RockMarWilloughby Surgery Center LLCRaeforMichMulticare Health SystemaGeorgina [phone])Memorial Hos77617 SchoolMarRichmond State HospitalRaeforMichSouthern Eye Surgery Center LLCaGeorgina [phone] Austin Gi Surgicenter LLC Dba176 UniMarSullivan County Memorial HospitalRaeforMichMeadows Surgery CenteraGeorgina [phone])Lakeview Re2276 MarUrology Of Central Pennsylvania IncRaeforMichVa Medical Center - Alvin C. York CampusaGeorgina 6838-Select Specialty Hosp2660 FaiMarKaiser Fnd Hosp - San JoseRaeforMichMedical Center Of The RockiesaGeorgina [phone] 49882MarDelray Beach Surgery CenterRaeforMichSlade Asc LLCaGeorgina [phone] West GMarPiedmont Medical CenterRaeforMichMilford HospitalaGeorgina 0516 Brunswick Pain T4224 BirMarWindsor Mill Surgery Center LLCRaeforMichBoone County Health CenteraGeorgina [phone])Novamed Surgery Cent4610 VMarBrazosport Eye InstituteRaeforMichSedan City HospitalaGeorgina [phone])Richland Parish Ho6328 TarMarJohnston Memorial HospitalRaeforMichBaptist Health Surgery Center At Bethesda WestaGeorgina <65 GreeMarCentura Health-St Anthony HospitalRaeforMichEast Brunswick Surgery Center LLCaGeorgina 2409-Irwin Army Community Hosp8132MarLexington Medical CenterRaeforMichSt Francis Regional Med CenteraGeorgina 067019 SW. SanMarNew York Presbyterian QueensRaeforMichNew York Community HospitalaGeorgina 5775-Va Medical Ce47123 WalnuMarKempsville Center For Behavioral HealthRaeforMichWetzel County HospitalaGeorgina 488542MarBay Area Endoscopy Center Limited PartnershipRaeforMichMission Hospital Laguna BeachaGeorgina 5951-Piedmont Newton Hos6840 MMarSpanish Hills Surgery Center LLCRaeforMichSt Vincent Salem Hospital IncaGeorgina [phone]. Alexius Hospital - Jefferson CampusRaeforMichDoctors Medical Center - San PabloaGeorgina 9623-Ande317 CherMarBonner General HospitalRaeforMichNor Lea District Hospit3710 MarThe Urology Center PcikaMichGuilford Surgery CenteraHuelGeorgina 8204-Emma377 ValMarAsheville Gastroenterology Associates PaRaeforMichAscension Sacred Heart HospitalaGeorgina 620Emmau13 GoldMarCerritos Surgery CenterRaeforMichThe University Of Vermont Health Network - Champlain Valley Physicians HospitalaGeorgina 5364-Thousand Oaks Surgical Hosp987 MarSurical Center Of Wasco LLCRaeforMichAshe Memorial Hospital, Inc.aGeorgina 4458 Pe493 NMarVa Caribbean Healthcare SystemRaeforMichCoral Springs Surgicenter LtdaGeorgina 061Christus Coush2698 Jockey HMarSouth Broward EndoscopyRaeforMichHiLLCrest Hospital SouthaGeorgina 1845-Eye Surg5MarKindred Hospital - Delaware CountyRaeforMichFranciscan Children'S Hospital & Rehab CenteraGeorgina 88641 North SMarQuail Run Behavioral HealthRaeforMichAscension-All SaintsaGeorgina [phone] 710MarShare Memorial HospitalRaeforMichLatimer County General HospitalaGeorgina 2(850)[place] 11MarChapin Orthopedic Surgery CenterRaeforMichSaint Lukes Surgery Center Shoal CreekaGeorgina 2(937)Newport Coast Surger76MarLindsay House Surgery Center LLCRaeforMichOrthopaedic Surgery Center Of Asheville LPaGeorgina 7813-Georgia Ophthalmologists LLC Dba Georgia Ophthalmologists4952 GlMarSurgcenter Northeast LLCRaeforMichSanta Barbara Cottage HospitalaGeorgina 8684-Adventis72MarHazleton Endoscopy Center IncRaeforMichScripps HealthaGeorgina [phone])St. Mary'S Healt47654 SMarFayette Regional Health SystemRaeforMichUchealth Longs Peak Surgery CenteraGeorgina 6504-79417 CanteMarMayfair Digestive Health Center LLCRaeforMichOwensboro Ambulatory Surgical Facility LtdaGeorgina 8215-Scottsdale Healt34 CaMarChristus Spohn Hospital Corpus ChristiRaeforMichTrinity Medical Center West-EraGeorgina 5401-134 North MarNorthwest Spine And Laser Surgery Center LLCRaeforMichSt Patrick HospitalaGeorgina Q64uintden Oxfordisty [phone] Rehabilitation HilBalinda Q clear.  Coronary artery calcifications. The liver, spleen, gallbladder, pancreas, adrenal glands, inferior vena cava, and retroperitoneal lymph nodes  are unremarkable. Scattered calcifications in the abdominal aorta without aneurysm. Small parenchymal cysts in the kidneys. No hydronephrosis. Stomach, small bowel, and colon are not abnormally distended. Contrast material flows through to the colon without evidence of bowel obstruction. No free air or free fluid in the abdomen. Pelvis: The appendix is diffusely distended with periappendiceal edema and infiltration. An appendicolith is present. Appearance is consistent with acute appendicitis, likely with local transmural involvement. No discrete abscess is identified. Reactive thickening of the wall of the terminal ileum. Uterus is not enlarged. No free or loculated pelvic fluid collections. Bladder wall is not thickened. Degenerative changes in the lumbar spine most prominent at L3-4. IMPRESSION: Distended and diffusely thick-walled appendix with at appendicolith and periappendiceal edema/stranding consistent with acute appendicitis and transmural involvement. No discrete abscess. At 02/29/2016 at 12:27 am, I attempted to call the referring provider, PA STEPHANIE ENGLISH , at the number provided. No one was available at that number and no voicemail was available. If unable to find the provider, we will send the patient to the ED for further evaluation. Electronically Signed: By: William  Stevens M.D. On: 02/29/2016 00:44    Review of Systems  Constitutional: Negative for weight loss.  HENT: Negative for ear discharge, ear pain, hearing loss and tinnitus.   Eyes: Negative for blurred vision, double vision, photophobia and pain.  Respiratory: Negative for cough, sputum production and shortness of breath.   Cardiovascular: Negative for chest pain.  Gastrointestinal: Positive for abdominal pain, diarrhea, nausea and vomiting.  Genitourinary: Negative for dysuria, flank pain, frequency and urgency.  Musculoskeletal: Negative for back pain, falls, joint pain, myalgias and neck pain.  Neurological: Negative  for dizziness, tingling, sensory change, focal weakness, loss of consciousness and headaches.  Endo/Heme/Allergies: Does not bruise/bleed easily.  Psychiatric/Behavioral: Negative for depression, memory loss and substance abuse. The patient is not nervous/anxious.     Blood pressure 165/85, pulse 101, temperature 98.4 F (36.9 C), temperature source Oral, resp. rate 19, SpO2 99 %. Physical Exam  WDWN in NAD HEENT:  EOMI, sclera anicteric Oral mucosa moist; normal dentition Neck:  No masses, no thyromegaly Lungs:  CTA bilaterally; normal respiratory effort CV:  Regular rate and rhythm; no murmurs Abd:  +bowel sounds, soft, tender in RLQ at McBurney's point; no Rovsings sign; no peritonitis Ext:  Well-perfused; no edema Skin:  Warm, dry; no sign of jaundice Psych - alert, oriented x 4; normal mood/ affect  Assessment/Plan 1.  Acute appendicitis with no sign of abscess 2.  Insulin dependent diabetes  Admit to floor Sliding scale insulin NPO Laparoscopic appendectomy later today by Dr. Ramirez.  The surgical procedure has been  discussed with the patient.  Potential risks, benefits, alternative treatments, and expected outcomes have been explained.  All of the patient's questions at this time have been answered.  The likelihood of reaching the patient's treatment goal is good.  The patient understand the proposed surgical procedure and wishes to proceed.   Wynona Luna., MD 02/29/2016, 3:30 AM

## 2016-02-29 NOTE — ED Notes (Signed)
MD Tseui at bedside

## 2016-02-29 NOTE — Op Note (Signed)
02/29/2016  2:17 PM  PATIENT:  Rebecca Wagner  55 y.o. female  PRE-OPERATIVE DIAGNOSIS:  ACUTE APPENDICITIS  POST-OPERATIVE DIAGNOSIS:  ACUTE PERFORATED APPENDICITIS  PROCEDURE:  Procedure(s): LAPAROSCOPIC APPENDECTOMY (N/A)  SURGEON:  Surgeon(s) and Role:    * Axel FillerArmando Demarian Epps, MD - Primary  ANESTHESIA:   local and general  EBL:  Total I/O In: 2548.3 [I.V.:2548.3] Out: 310 [Urine:300; Blood:10]  BLOOD ADMINISTERED:none  DRAINS: none   LOCAL MEDICATIONS USED:  BUPIVICAINE   SPECIMEN:  Source of Specimen:  APPENDIX  DISPOSITION OF SPECIMEN:  PATHOLOGY  COUNTS:  YES  TOURNIQUET:  * No tourniquets in log *  DICTATION: .Dragon Dictation Complications: none  Counts: reported as correct x 2  Findings:  The patient had a acutely inflammed, perforated appendix  Specimen: Appendix  Indications for procedure:  The patient is a 55 year old female with a history of periumbilical pain localized in the right lower quadrant patient had a CT scan which revealed signs consistent with acute appendicitis the patient back in for laparoscopic appendectomy.  Details of the procedure:The patient was taken back to the operating room. The patient was placed in supine position with bilateral SCDs in place.  A foley catheter was place. The patient was prepped and draped in the usual sterile fashion.  After appropriate anitbiotics were confirmed, a time-out was confirmed and all facts were verified.    A pneumoperitoneum of 14 mmHg was obtained via a Veress needle technique in the left lower quadrant quadrant.  A 5 mm trocar and 5 mm camera then placed intra-abdominally there is no injury to any intra-abdominal organs a 10 mm infraumbilical port was placed and direct visualization as was a 5 mm port in the suprapubic area.   The appendix was identified and seen to be perforated. The cecum and appendix were peeled away from the lateral abdominal wall. The appendix was cleaned down to the  appendiceal base. The mesoappendix was then incised and the appendiceal artery was cauterized.  The the appendiceal base was clean.  At this time an Endoloop was placed proximallyx2 and one distally and the appendix was transected between these 2. A retrieval bag was then placed into the abdomen and the specimen placed in the bag. A 3-0 silk was used ina  Figure of eight fashion x1 to obliterated the appendiceal orfice.The appendiceal stump was cauterized. We evacuate the fluid from the pelvis until the effluent was clear.  The appendix and retrieval  bag was then retrieved via the supraumbilical port. #1 Vicryl was used to reapproximate the fascia at the umbilical port site x1. The skin was reapproximated all port sites 3-0 Monocryl subcuticular fashion. The skin was dressed with steri-strips, guaze, and tape.  The patient had the foley removed. The patient was awakened from general anesthesia was taken to recovery room in stable condition.      PLAN OF CARE: Admit to inpatient   PATIENT DISPOSITION:  PACU - hemodynamically stable.   Delay start of Pharmacological VTE agent (>24hrs) due to surgical blood loss or risk of bleeding: not applicable

## 2016-03-01 ENCOUNTER — Encounter (HOSPITAL_COMMUNITY): Payer: Self-pay | Admitting: General Surgery

## 2016-03-01 DIAGNOSIS — E119 Type 2 diabetes mellitus without complications: Secondary | ICD-10-CM | POA: Diagnosis not present

## 2016-03-01 DIAGNOSIS — K352 Acute appendicitis with generalized peritonitis: Secondary | ICD-10-CM | POA: Diagnosis not present

## 2016-03-01 DIAGNOSIS — I10 Essential (primary) hypertension: Secondary | ICD-10-CM | POA: Diagnosis not present

## 2016-03-01 DIAGNOSIS — Z87891 Personal history of nicotine dependence: Secondary | ICD-10-CM | POA: Diagnosis not present

## 2016-03-01 DIAGNOSIS — B370 Candidal stomatitis: Secondary | ICD-10-CM | POA: Diagnosis not present

## 2016-03-01 DIAGNOSIS — Z79899 Other long term (current) drug therapy: Secondary | ICD-10-CM | POA: Diagnosis not present

## 2016-03-01 DIAGNOSIS — Z794 Long term (current) use of insulin: Secondary | ICD-10-CM | POA: Diagnosis not present

## 2016-03-01 LAB — GLUCOSE, CAPILLARY
GLUCOSE-CAPILLARY: 249 mg/dL — AB (ref 65–99)
Glucose-Capillary: 199 mg/dL — ABNORMAL HIGH (ref 65–99)
Glucose-Capillary: 236 mg/dL — ABNORMAL HIGH (ref 65–99)
Glucose-Capillary: 262 mg/dL — ABNORMAL HIGH (ref 65–99)
Glucose-Capillary: 199 mg/dL — ABNORMAL HIGH (ref 65–99)

## 2016-03-01 LAB — HEMOGLOBIN A1C
Hgb A1c MFr Bld: 14.1 % — ABNORMAL HIGH (ref 4.8–5.6)
Mean Plasma Glucose: 358 mg/dL

## 2016-03-01 MED ORDER — CIPROFLOXACIN HCL 500 MG PO TABS
500.0000 mg | ORAL_TABLET | Freq: Two times a day (BID) | ORAL | Status: DC
  Administered 2016-03-01 – 2016-03-02 (×3): 500 mg via ORAL
  Filled 2016-03-01 (×3): qty 1

## 2016-03-01 MED ORDER — METRONIDAZOLE 500 MG PO TABS
500.0000 mg | ORAL_TABLET | Freq: Three times a day (TID) | ORAL | 0 refills | Status: DC
Start: 2016-03-01 — End: 2016-03-02

## 2016-03-01 MED ORDER — INSULIN ASPART 100 UNIT/ML ~~LOC~~ SOLN
0.0000 [IU] | Freq: Three times a day (TID) | SUBCUTANEOUS | Status: DC
  Administered 2016-03-01 – 2016-03-02 (×2): 8 [IU] via SUBCUTANEOUS
  Administered 2016-03-02 (×2): 3 [IU] via SUBCUTANEOUS

## 2016-03-01 MED ORDER — NYSTATIN 100000 UNIT/ML MT SUSP
5.0000 mL | Freq: Four times a day (QID) | OROMUCOSAL | Status: DC
  Administered 2016-03-01 – 2016-03-02 (×3): 500000 [IU] via ORAL
  Filled 2016-03-01 (×3): qty 5

## 2016-03-01 MED ORDER — WHITE PETROLATUM GEL
Status: AC
  Administered 2016-03-01: 18:00:00
  Filled 2016-03-01: qty 1

## 2016-03-01 MED ORDER — OXYCODONE-ACETAMINOPHEN 5-325 MG PO TABS: 1.0000 | ORAL_TABLET | Freq: Four times a day (QID) | ORAL | 0 refills | Status: DC | PRN

## 2016-03-01 NOTE — Consult Note (Signed)
   [name]   03/01/2016  Rebecca Wagner 1960/11/19 [id number]   Referral received for Link to [facility] Care Management for  employees/dependents with [employer] [facility] [employer] insurance. Met with [name] at bedside to discuss Link to Wellness DM management program. She reports she is interested. Agreeable to be called for appointment to be scheduled to meet with Link to [facility]. Also agreeable to post hospital discharge call. Confirmed best contact number as [phone]. Link to [employer] provided along with contact information. Also provided [employer] telephone number as well. Appreciative of visit. Confirms her Primary Care MD is Dr. Loma Sousa but states she plans on changing Primary Care to [facility]. Will make inpatient RNCM aware.   Marthenia Rolling, MSN-Ed, RN,BSN [facility] Liaison [phone]

## 2016-03-01 NOTE — Progress Notes (Signed)
Attempted to talk with patient regarding elevated A1C >14. She states that she is currently resting but knows that she needs to take better care of herself.  She was ordered insulin at home but was not taking?  Asked if she would be interested in outpatient diabetes education and she states "yes".  Will place order per protocol. [facility] also in place.  Encouraged patient to let RN know if she has any educational needs related to her diabetes.   Please consider adding Levemir 15 units while patient is in the hospital.    Thanks, Beryl MeagerJenny Viviane Semidey, RN, BC-ADM Inpatient Diabetes Coordinator Pager [phone]

## 2016-03-01 NOTE — Progress Notes (Signed)
[facility] Progress Note  1 Day Post-Op  Subjective: Reports abdominal pain worse with movement and mild nausea. Tolerated a regular breakfast without vomiting. +flatus. Has ambulated to bathroom but not in hallway. Requesting Diflucan because in the past she gets yeast infections from using flagyl. Requesting to go home tomorrow - does not feel ready for discharge.   Objective: Vital signs in last 24 hours: Temp:  [97.7 F (36.5 C)-98.9 F (37.2 C)] 98.8 F (37.1 C) (09/01 0828) Pulse Rate:  [76-89] 86 (09/01 0828) Resp:  [12-20] 18 (09/01 0828) BP: (137-197)/(79-103) 151/87 (09/01 0828) SpO2:  [91 %-100 %] 100 % (09/01 0828) Last BM Date: 02/28/16  Intake/Output from previous day: 08/31 0701 - 09/01 0700 In: 4060.1 [P.O.:260; I.V.:3550.1; IV Piggyback:250] Out: 1210 [Urine:1200; Blood:10] Intake/Output this shift: Total I/O In: -  Out: 400 [Urine:400]  PE: Gen:  Alert, NAD, pleasant Card:  RRR, no M/G/R  Pulm:  CTA, no W/R/R Abd: Soft, appropriately tender, ND, +BS, incisions C/D/I Ext:  No erythema, edema, or tenderness   Lab Results:   Recent Labs  02/29/16 0247 02/29/16 0537  WBC 14.7* 13.6*  HGB 13.5 13.1  HCT 40.3 38.7  PLT 265 257   BMET  Recent Labs  02/28/16 2054 02/29/16 0247 02/29/16 0537  NA 129* 127*  --   K 3.4* 3.3*  --   CL 88* 90*  --   CO2 24 20*  --   GLUCOSE 314* 331*  --   BUN 24* 20  --   CREATININE 1.03* 0.92 0.93  CALCIUM 10.0 9.3  --    PT/INR No results for input(s): LABPROT, INR in the last 72 hours. CMP     Component Value Date/Time   NA 127 (L) 02/29/2016 0247   K 3.3 (L) 02/29/2016 0247   CL 90 (L) 02/29/2016 0247   CO2 20 (L) 02/29/2016 0247   GLUCOSE 331 (H) 02/29/2016 0247   BUN 20 02/29/2016 0247   CREATININE 0.93 02/29/2016 0537   CALCIUM 9.3 02/29/2016 0247   PROT 7.0 02/29/2016 0247   ALBUMIN 3.3 (L) 02/29/2016 0247   AST 13 (L) 02/29/2016 0247   ALT 11 (L) 02/29/2016 0247   ALKPHOS 71  02/29/2016 0247   BILITOT 1.0 02/29/2016 0247   GFRNONAA >60 02/29/2016 0537   GFRAA >60 02/29/2016 0537   Lipase  No results found for: LIPASE     Studies/Results: Ct Abdomen Pelvis W Contrast  Addendum Date: 02/29/2016   ADDENDUM REPORT: 02/29/2016 01:39 ADDENDUM: Discussed the findings on 02/29/2016 at 1:36 am with Dr. Cleta Alberts from Urgent Care, who called in to follow up on the patient. Electronically Signed   By: Burman Nieves M.D.   On: 02/29/2016 01:39   Addendum Date: 02/29/2016   ADDENDUM REPORT: 02/29/2016 01:06 ADDENDUM: I discussed the CT findings with the patient and we are sending her over to the ED for further evaluation and management. 0106 hours on 02/29/16 Electronically Signed   By: Burman Nieves M.D.   On: 02/29/2016 01:06   Result Date: 02/29/2016 CLINICAL DATA:  Right lower quadrant pain and weakness for 4 days. Nausea and vomiting. EXAM: CT ABDOMEN AND PELVIS WITH CONTRAST TECHNIQUE: Multidetector CT imaging of the abdomen and pelvis was performed using the standard protocol following bolus administration of intravenous contrast. CONTRAST:  ISOVUE-300 IOPAMIDOL (ISOVUE-300) INJECTION 61% COMPARISON:  None. FINDINGS: The lung bases are clear.  Coronary artery calcifications. The liver, spleen, gallbladder, pancreas, adrenal glands, inferior vena cava,  and retroperitoneal lymph nodes are unremarkable. Scattered calcifications in the abdominal aorta without aneurysm. Small parenchymal cysts in the kidneys. No hydronephrosis. Stomach, small bowel, and colon are not abnormally distended. Contrast material flows through to the colon without evidence of bowel obstruction. No free air or free fluid in the abdomen. Pelvis: The appendix is diffusely distended with periappendiceal edema and infiltration. An appendicolith is present. Appearance is consistent with acute appendicitis, likely with local transmural involvement. No discrete abscess is identified. Reactive thickening of  the wall of the terminal ileum. Uterus is not enlarged. No free or loculated pelvic fluid collections. Bladder wall is not thickened. Degenerative changes in the lumbar spine most prominent at L3-4. IMPRESSION: Distended and diffusely thick-walled appendix with at appendicolith and periappendiceal edema/stranding consistent with acute appendicitis and transmural involvement. No discrete abscess. At 02/29/2016 at 12:27 am, I attempted to call the referring provider, PA [facility] Surgery CenterTEPHANIE ENGLISH , at the number provided. No one was available at that number and no voicemail was available. If unable to find the provider, we will send the patient to the ED for further evaluation. Electronically Signed: By: Burman NievesWilliam  Stevens M.D. On: 02/29/2016 00:44    Anti-infectives: Anti-infectives    Start     Dose/Rate Route Frequency Ordered Stop   03/01/16 1040  ciprofloxacin (CIPRO) tablet 500 mg     500 mg Oral 2 times daily 03/01/16 1041 03/09/16 0759   03/01/16 0300  cefTRIAXone (ROCEPHIN) 2 g in dextrose 5 % 50 mL IVPB     2 g 100 mL/hr over 30 Minutes Intravenous Every 24 hours 02/29/16 0511     03/01/16 0000  metroNIDAZOLE (FLAGYL) 500 MG tablet     500 mg Oral 3 times daily 03/01/16 1041 03/09/16 2359   02/29/16 1200  metroNIDAZOLE (FLAGYL) IVPB 500 mg     500 mg 100 mL/hr over 60 Minutes Intravenous Every 8 hours 02/29/16 0511     02/29/16 0215  cefTRIAXone (ROCEPHIN) 1 g in dextrose 5 % 50 mL IVPB     1 g 100 mL/hr over 30 Minutes Intravenous  Once 02/29/16 0204 02/29/16 0433   02/29/16 0215  metroNIDAZOLE (FLAGYL) IVPB 500 mg     500 mg 100 mL/hr over 60 Minutes Intravenous  Once 02/29/16 0204 02/29/16 0409       Assessment/Plan Acute perforated appendicitis  S/p laparoscopic appendectomy 02/29/16 Dr. Shelle Ironamriez -continue IV rocephin/flagyl -ambulate  Dispo: IV abx, d/c tomorrow    LOS: 0 days    Adam PhenixElizabeth S Deyona Soza , [facility] LLCA-C [facility] 03/01/2016, 10:49 AM Pager:  [phone] Consults: [phone] Mon-Fri 7:00 am-4:30 pm Sat-Sun 7:00 am-11:30 am

## 2016-03-01 NOTE — Discharge Instructions (Signed)
Schedule a post-operative follow up appointment in 2 weeks. Please arrive at least 30 min before your appointment to complete your check in paperwork.  If you are unable to arrive 30 min prior to your appointment time we may have to cancel or reschedule you.  LAPAROSCOPIC SURGERY: POST OP INSTRUCTIONS  1. DIET: Follow a light bland diet the first 24 hours after arrival home, such as soup, liquids, crackers, etc. Be sure to include lots of fluids daily. Avoid fast food or heavy meals as your are more likely to get nauseated. Eat a low fat the next few days after surgery.  2. Take your usually prescribed home medications unless otherwise directed. 3. PAIN CONTROL:  1. Pain is best controlled by a usual combination of three different methods TOGETHER:  1. Ice/Heat 2. Over the counter pain medication 3. Prescription pain medication 2. Most patients will experience some swelling and bruising around the incisions. Ice packs or heating pads (30-60 minutes up to 6 times a day) will help. Use ice for the first few days to help decrease swelling and bruising, then switch to heat to help relax tight/sore spots and speed recovery. Some people prefer to use ice alone, heat alone, alternating between ice & heat. Experiment to what works for you. Swelling and bruising can take several weeks to resolve.  3. It is helpful to take an over-the-counter pain medication regularly for the first few weeks. Choose one of the following that works best for you:  1. Naproxen (Aleve, etc) Two 220mg  tabs twice a day 2. Ibuprofen (Advil, etc) Three 200mg  tabs four times a day (every meal & bedtime) 3. Acetaminophen (Tylenol, etc) 500-650mg  four times a day (every meal & bedtime) 4. A prescription for pain medication (such as oxycodone, hydrocodone, etc) should be given to you upon discharge. Take your pain medication as prescribed.  1. If you are having problems/concerns with the prescription medicine (does not control pain,  nausea, vomiting, rash, itching, etc), please call us [phone] to see if we need to switch you to a different pain medicine that will work better for you and/or control your side effect better. 2. If you need a refill on your pain medication, please contact your pharmacy. They will contact our office to request authorization. Prescriptions will not be filled after 5 pm or on week-ends. 4. Avoid getting constipated. Between the surgery and the pain medications, it is common to experience some constipation. Increasing fluid intake and taking a fiber supplement (such as Metamucil, Citrucel, FiberCon, MiraLax, etc) 1-2 times a day regularly will usually help prevent this problem from occurring. A mild laxative (prune juice, Milk of Magnesia, MiraLax, etc) should be taken according to package directions if there are no bowel movements after 48 hours.  5. Watch out for diarrhea. If you have many loose bowel movements, simplify your diet to bland foods & liquids for a few days. Stop any stool softeners and decrease your fiber supplement. Switching to mild anti-diarrheal medications (Kayopectate, Pepto Bismol) can help. If this worsens or does not improve, please call us. 6. Wash / shower every day. You may shower over the dressings as they are waterproof. Continue to shower over incision(s) after the dressing is off. 7. Remove your waterproof bandages 5 days after surgery. You may leave the incision open to air. You may replace a dressing/Band-Aid to cover the incision for comfort if you wish.  8. ACTIVITIES as tolerated:  1. You may resume regular (light) daily activities beginning  the next day--such as daily self-care, walking, climbing stairs--gradually increasing activities as tolerated. If you can walk 30 minutes without difficulty, it is safe to try more intense activity such as jogging, treadmill, bicycling, low-impact aerobics, swimming, etc. 2. Save the most intensive and strenuous activity for last  such as sit-ups, heavy lifting, contact sports, etc Refrain from any heavy lifting or straining until you are off narcotics for pain control.  3. DO NOT PUSH THROUGH PAIN. Let pain be your guide: If it hurts to do something, don't do it. Pain is your body warning you to avoid that activity for another week until the pain goes down. 4. You may drive when you are no longer taking prescription pain medication, you can comfortably wear a seatbelt, and you can safely maneuver your car and apply brakes. 5. You may have sexual intercourse when it is comfortable.  9. FOLLOW UP in our office  1. Please call [facility] at [phone] to set up an appointment to see your surgeon in the office for a follow-up appointment approximately 2-3 weeks after your surgery. 2. Make sure that you call for this appointment the day you arrive home to insure a convenient appointment time.      10. IF YOU HAVE DISABILITY OR FAMILY LEAVE FORMS, BRING THEM TO THE               OFFICE FOR PROCESSING.   WHEN TO CALL US [phone]:  1. Poor pain control 2. Reactions / problems with new medications (rash/itching, nausea, etc)  3. Fever over 101.5 F (38.5 C) 4. Inability to urinate 5. Nausea and/or vomiting 6. Worsening swelling or bruising 7. Continued bleeding from incision. 8. Increased pain, redness, or drainage from the incision  The clinic staff is available to answer your questions during regular business hours (8:30am-5pm). Please dont hesitate to call and ask to speak to one of our nurses for clinical concerns.  If you have a medical emergency, go to the nearest emergency room or call 911.  [facility] is always on call at the [facility]   [facility] [place]  [address], [address], [place], [place] [address] ?  MAIN: [phone] ? TOLL FREE: [phone] ?  FAX [phone]  [email or web address]

## 2016-03-02 DIAGNOSIS — E119 Type 2 diabetes mellitus without complications: Secondary | ICD-10-CM | POA: Diagnosis not present

## 2016-03-02 DIAGNOSIS — Z79899 Other long term (current) drug therapy: Secondary | ICD-10-CM | POA: Diagnosis not present

## 2016-03-02 DIAGNOSIS — I10 Essential (primary) hypertension: Secondary | ICD-10-CM | POA: Diagnosis not present

## 2016-03-02 DIAGNOSIS — Z87891 Personal history of nicotine dependence: Secondary | ICD-10-CM | POA: Diagnosis not present

## 2016-03-02 DIAGNOSIS — Z794 Long term (current) use of insulin: Secondary | ICD-10-CM | POA: Diagnosis not present

## 2016-03-02 DIAGNOSIS — B370 Candidal stomatitis: Secondary | ICD-10-CM | POA: Diagnosis not present

## 2016-03-02 DIAGNOSIS — K352 Acute appendicitis with generalized peritonitis: Secondary | ICD-10-CM | POA: Diagnosis not present

## 2016-03-02 LAB — GLUCOSE, CAPILLARY
GLUCOSE-CAPILLARY: 256 mg/dL — AB (ref 65–99)
Glucose-Capillary: 178 mg/dL — ABNORMAL HIGH (ref 65–99)

## 2016-03-02 MED ORDER — FLUCONAZOLE 100 MG PO TABS
100.0000 mg | ORAL_TABLET | Freq: Every day | ORAL | Status: DC
  Administered 2016-03-02: 100 mg via ORAL
  Filled 2016-03-02: qty 1

## 2016-03-02 MED ORDER — OXYCODONE HCL 5 MG PO TABS: 5.0000 mg | ORAL_TABLET | Freq: Four times a day (QID) | ORAL | 0 refills | Status: DC | PRN

## 2016-03-02 MED ORDER — FLUCONAZOLE 100 MG PO TABS: 100.0000 mg | ORAL_TABLET | Freq: Every day | ORAL | 0 refills | Status: DC

## 2016-03-02 MED ORDER — NYSTATIN 100000 UNIT/ML MT SUSP: 5.0000 mL | Freq: Four times a day (QID) | OROMUCOSAL | 0 refills | Status: AC

## 2016-03-02 MED ORDER — METRONIDAZOLE 500 MG PO TABS: 500.0000 mg | ORAL_TABLET | Freq: Three times a day (TID) | ORAL | 0 refills | Status: AC

## 2016-03-02 MED ORDER — AMOXICILLIN-POT CLAVULANATE 875-125 MG PO TABS
1.0000 | ORAL_TABLET | Freq: Two times a day (BID) | ORAL | Status: DC
  Administered 2016-03-02: 1 via ORAL
  Filled 2016-03-02: qty 1

## 2016-03-02 MED ORDER — AMOXICILLIN-POT CLAVULANATE 875-125 MG PO TABS: 1.0000 | ORAL_TABLET | Freq: Two times a day (BID) | ORAL | 0 refills | Status: AC

## 2016-03-02 NOTE — Discharge Summary (Signed)
Patient ID: Rebecca Wagner [id number] 55 y.o. [birth year]  Admit date: 02/29/2016  Discharge date and time: No discharge date for patient encounter.  Admitting Physician: Manus Rudd  Discharge Physician: Ernestene Mention  Admission Diagnoses: Acute appendicitis, unspecified acute appendicitis type [K35.80]  Discharge Diagnoses: Acute perforated appendicitis                                         Oral candidiasis, thrush  Operations: Procedure(s): LAPAROSCOPIC APPENDECTOMY  Admission Condition: fair  Discharged Condition: good  Indication for Admission: The patient is a 55 year old female with a history of periumbilical pain localized in the right lower quadrant patient had a CT scan which revealed signs consistent with acute appendicitis the patient back in for laparoscopic appendectomy.  Hospital Course: On the day of admission the patient was taken to the operating room and underwent a laparoscopic appendectomy.  The appendix was seen to be perforated.  Laparoscopic appendectomy was uneventful.  The abdomen was copiously irrigated.   She was admitted for 48 hours for observation and IV antibiotics.  She resumed diet and ambulation and uneventful urinary voiding without difficulty.  She developed thrush which she has had in the past.  She was started on nystatin mouthwash.    On postop day 2 she was doing well and wanted to go home.  She was tolerating a diet.  Abdomen was soft and nondistended of the wounds looked good.     She was discharged with several prescriptions.  Oxycodone for pain, Diflucan 100 mg daily for 5 days, nystatin mouthwash for 7 days, Augmentin 8% to 5 mg twice a day for 7 days.     Diet and activities were discussed.  She will return to see Dr. Derrell Lolling in 3 weeks.  Consults: None  Significant Diagnostic Studies: Lab work, CT scans.  Surgical pathology which is pending  Treatments: surgery: Laparoscopic appendectomy  Disposition: Home  Patient  Instructions:    Medication List    TAKE these medications   ACIDOPHILUS PO Take 1-2 tablets by mouth daily.   amoxicillin-clavulanate 875-125 MG tablet Commonly known as:  AUGMENTIN Take 1 tablet by mouth every 12 (twelve) hours.   CALCIUM-MAGNESIUM-ZINC PO Take 1 tablet by mouth daily.   fluconazole 100 MG tablet Commonly known as:  DIFLUCAN Take 1 tablet (100 mg total) by mouth daily.   insulin aspart 100 UNIT/ML injection Commonly known as:  novoLOG Inject 5-10 Units into the skin 3 (three) times daily as needed for high blood sugar.   Magnesium 250 MG Tabs Take 1 tablet by mouth daily.   metroNIDAZOLE 500 MG tablet Commonly known as:  FLAGYL Take 1 tablet (500 mg total) by mouth 3 (three) times daily.   nystatin 100000 UNIT/ML suspension Commonly known as:  MYCOSTATIN Take 5 mLs (500,000 Units total) by mouth 4 (four) times daily.   oxyCODONE 5 MG immediate release tablet Commonly known as:  Oxy IR/ROXICODONE Take 1-2 tablets (5-10 mg total) by mouth every 6 (six) hours as needed for severe pain.   oxyCODONE-acetaminophen 5-325 MG tablet Commonly known as:  PERCOCET/ROXICET Take 1 tablet by mouth every 6 (six) hours as needed for moderate pain or severe pain.       Activity: No sports or heavy lifting for 3 weeks.  Okay to drive car in 3-6 days. Diet: low fat, low cholesterol diet Wound Care: none needed  Follow-up:  With Dr. Derrell Lollingamirez in 3 weeks.  Signed: Angelia MouldHaywood M. Derrell LollingIngram, M.D., FACS General and minimally invasive surgery Breast and Colorectal Surgery  03/02/2016, 9:52 AM

## 2016-03-02 NOTE — Progress Notes (Signed)
Discharge home. Home discharge instruction given, no question verbalized. 

## 2016-03-06 ENCOUNTER — Other Ambulatory Visit: Payer: Self-pay | Admitting: *Deleted

## 2016-03-06 NOTE — Patient Outreach (Signed)
[facility]) Care Management  03/06/2016  Stevphen MeuseValerie S Koy 06-23-1961 [id number]   Subjective: Telephone call to patient's home/ mobile number, no answer, left HIPAA compliant voicemail message, and requested call back.   Objective: Per chart review: Patient hospitalized 02/29/16 - 03/02/16 for  Acute perforated appendicitis,  Oral candidiasis, and thrush.   Status post LAPAROSCOPIC APPENDECTOMY on 02/29/16.    Patient also has a history of depression, diabetes, and hypertension.    Assessment: UMR Transition of care referral on 03/01/16.    Telephone screen / Transition of care follow up, pending patient contact.     Plan: RNCM will call patient for 2nd telephone outreach attempt, telephone screen/ transition of care follow up, within 10 business days, if no return call.   Janitza Revuelta H. Gardiner Barefootooper RN, BSN, CCM [facility] Care Management [facility] Telephonic CM Phone: [phone] Fax: [phone]

## 2016-03-07 ENCOUNTER — Other Ambulatory Visit: Payer: Self-pay | Admitting: *Deleted

## 2016-03-07 ENCOUNTER — Ambulatory Visit: Payer: Self-pay | Admitting: *Deleted

## 2016-03-07 NOTE — Patient Outreach (Addendum)
[facility]) Care Management  03/07/2016  Rebecca MeuseValerie S Wagner May 18, 1961 [id number]   Subjective: Received voicemail message from patient, states she is returning call, is interested in Link to Wellness program, currently on way to MD's office, and will call back at a later time.   Objective: Per chart review: Patient hospitalized 02/29/16 - 03/02/16 for  Acute perforated appendicitis,  Oral candidiasis, and thrush.   Status post LAPAROSCOPIC APPENDECTOMY on 02/29/16.    Patient also has a history of depression, diabetes, and hypertension.    Assessment: UMR Transition of care referral on 03/01/16. Telephone screen / Transition of care follow up, pending patient contact.     Plan: RNCM will call patient for 2nd telephone outreach attempt, telephone screen/ transition of care follow up, within 10 business days, if no return call.   Rebecca Fargnoli H. Gardiner Barefootooper RN, BSN, CCM [facility] Care Management [facility] Telephonic CM Phone: [phone] Fax: [phone]

## 2016-03-08 ENCOUNTER — Other Ambulatory Visit: Payer: Self-pay | Admitting: *Deleted

## 2016-03-08 ENCOUNTER — Ambulatory Visit: Payer: Self-pay | Admitting: *Deleted

## 2016-03-08 DIAGNOSIS — R197 Diarrhea, unspecified: Secondary | ICD-10-CM | POA: Diagnosis not present

## 2016-03-08 NOTE — Patient Outreach (Addendum)
[facility]) Care Management  03/08/2016  Rebecca MeuseValerie S Wagner 1961-06-08 [id number]   Subjective: Telephone call to patient's home/ mobile number, no answer, left HIPAA compliant voicemail message, and requested call back.   Objective: Per chart review: Patient hospitalized 02/29/16 - 03/02/16 for  Acute perforated appendicitis,  Oral candidiasis, and thrush.   Status post LAPAROSCOPIC APPENDECTOMY on 02/29/16.    Patient also has a history of depression, diabetes, and hypertension.    Assessment: UMR Transition of care referral on 03/01/16.    Telephone screen / Transition of care follow up, pending patient contact.     Plan: RNCM will call patient for 3rd telephone outreach attempt, telephone screen/ transition of care follow up, within 10 business days, if no return call.   Rebecca Buchanon H. Gardiner Barefootooper RN, BSN, CCM [facility] Care Management [facility] Telephonic CM Phone: [phone] Fax: [phone]

## 2016-03-11 ENCOUNTER — Encounter: Payer: Self-pay | Admitting: *Deleted

## 2016-03-11 ENCOUNTER — Ambulatory Visit: Payer: Self-pay | Admitting: *Deleted

## 2016-03-11 ENCOUNTER — Other Ambulatory Visit: Payer: Self-pay | Admitting: *Deleted

## 2016-03-11 NOTE — Patient Outreach (Addendum)
[facility]) Care Management  03/11/2016  Rebecca MeuseValerie S Studstill 03/29/1961 [id number]   Subjective: Telephone call to patient's home/ mobile number, no answer, left HIPAA compliant voicemail message, and requested call back.   Objective: Per chart review: Patient hospitalized 02/29/16 - 03/02/16 for Acute perforated appendicitis, Oral candidiasis, andthrush. Status post LAPAROSCOPIC APPENDECTOMY on 02/29/16. Patient also has a history of depression, diabetes, and hypertension.    Assessment: UMR Transition of care referral on 03/01/16. Telephone screen / Transition of care follow up, pending patient contact.    Plan: RNCM will send patient unsuccessful outreach letter, [facility] pamphlet, and proceed with case closure, within 10 business days, if no return call.   Layson Bertsch H. Gardiner Barefootooper RN, BSN, CCM [facility] Care Management [facility] Telephonic CM Phone: [phone] Fax: [phone]

## 2016-03-14 ENCOUNTER — Encounter: Payer: Self-pay | Admitting: *Deleted

## 2016-03-14 ENCOUNTER — Other Ambulatory Visit: Payer: Self-pay | Admitting: *Deleted

## 2016-03-14 NOTE — Patient Outreach (Addendum)
[facility]) Care Management  03/14/2016  Rebecca Wagner 1960-08-02 [id number]   Subjective: Telephone call from patient and HIPAA verified.  States she is returning call.    Discussed [facility]MR Transition of Care and Link to Wellness services.   Patient states she is in agreement to receive transition of care follow up and Link to Wellness services.   Patient states her nausea and diarrhea have been coming and going since hospital discharge.  States she has called the surgeons office today regarding increase nausea and diarrhea, is awaiting call back.  States she provided surgeons office with a stool specimen on 03/08/16 and is waiting for the results to be called to her.    States she will follow up with MD to verify if they have received the results.  States her family medical leave act paper work is being filled out by Personal assistantthe surgeon.  Patient states she has not seen her primary MD (Dr. Leilani AbleBetti Wagner) in about 1 year and feels the MD will be upset with her for not following up.  RNCM advised patient of the importance of primary MD follow up after hospitalization.   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  Patient states she has a follow up surgeon appointment on 03/21/16.  States she has received most recent healthcare prior to hospitalization from [facility] [address] [place], [place].   Patient states she does not have any transition of care, care coordination, disease management, disease monitoring, transportation, community resource, or pharmacy needs at this time.  Patient in agreement to receive [facility] Care [facility] information, Advanced Directives packet, and call from Link to Wellness Coordination for enrollment appointment.   Telephone call to Iverson AlaminLaura Greeson at [facility], no answer left  message and email message sent,  regarding primary MD contact information update and request for Link to Wellness follow up appointment.  Objective: Per chart review: Patient hospitalized 02/29/16 - 03/02/16 for Acute perforated appendicitis, Oral candidiasis, andthrush. Status post LAPAROSCOPIC APPENDECTOMY on 02/29/16. Patient also has a history of depression, diabetes, and hypertension.    Assessment: UMR Transition of care referral on 03/01/16. Telephone screen / Transition of care follow up, completed. Patient has no Telephonic RNCM  needs at this time and has been referred to [facility] for follow up.    Plan: RNCM will send patient successful outreach letter, [facility] pamphlet, and Advanced Directive packet.     Zyeir Dymek H. Gardiner Barefootooper RN, BSN, CCM [facility] Care Management [facility] Telephonic CM Phone: [phone] Fax: [phone]

## 2016-04-10 ENCOUNTER — Emergency Department (HOSPITAL_COMMUNITY): Payer: 59

## 2016-04-10 ENCOUNTER — Inpatient Hospital Stay (HOSPITAL_COMMUNITY)
Admission: EM | Admit: 2016-04-10 | Discharge: 2016-04-15 | DRG: 064 | Disposition: A | Discharge status: Home / Self Care | Payer: 59 | Attending: Internal Medicine | Admitting: Internal Medicine

## 2016-04-10 ENCOUNTER — Encounter (HOSPITAL_COMMUNITY)
Admission: EM | Disposition: A | Discharge status: Home / Self Care | Payer: Self-pay | Source: Home / Self Care | Attending: Cardiovascular Disease

## 2016-04-10 DIAGNOSIS — I63512 Cerebral infarction due to unspecified occlusion or stenosis of left middle cerebral artery: Secondary | ICD-10-CM | POA: Diagnosis not present

## 2016-04-10 DIAGNOSIS — I110 Hypertensive heart disease with heart failure: Secondary | ICD-10-CM | POA: Diagnosis present

## 2016-04-10 DIAGNOSIS — R410 Disorientation, unspecified: Secondary | ICD-10-CM | POA: Diagnosis not present

## 2016-04-10 DIAGNOSIS — D649 Anemia, unspecified: Secondary | ICD-10-CM | POA: Diagnosis present

## 2016-04-10 DIAGNOSIS — E1165 Type 2 diabetes mellitus with hyperglycemia: Secondary | ICD-10-CM | POA: Diagnosis present

## 2016-04-10 DIAGNOSIS — Z794 Long term (current) use of insulin: Secondary | ICD-10-CM | POA: Diagnosis not present

## 2016-04-10 DIAGNOSIS — E785 Hyperlipidemia, unspecified: Secondary | ICD-10-CM | POA: Diagnosis present

## 2016-04-10 DIAGNOSIS — R404 Transient alteration of awareness: Secondary | ICD-10-CM | POA: Diagnosis not present

## 2016-04-10 DIAGNOSIS — I63112 Cerebral infarction due to embolism of left vertebral artery: Secondary | ICD-10-CM | POA: Diagnosis not present

## 2016-04-10 DIAGNOSIS — J9600 Acute respiratory failure, unspecified whether with hypoxia or hypercapnia: Secondary | ICD-10-CM

## 2016-04-10 DIAGNOSIS — M21372 Foot drop, left foot: Secondary | ICD-10-CM | POA: Diagnosis present

## 2016-04-10 DIAGNOSIS — I252 Old myocardial infarction: Secondary | ICD-10-CM

## 2016-04-10 DIAGNOSIS — M1990 Unspecified osteoarthritis, unspecified site: Secondary | ICD-10-CM | POA: Diagnosis present

## 2016-04-10 DIAGNOSIS — I255 Ischemic cardiomyopathy: Secondary | ICD-10-CM | POA: Diagnosis present

## 2016-04-10 DIAGNOSIS — Z833 Family history of diabetes mellitus: Secondary | ICD-10-CM | POA: Diagnosis not present

## 2016-04-10 DIAGNOSIS — D638 Anemia in other chronic diseases classified elsewhere: Secondary | ICD-10-CM | POA: Diagnosis present

## 2016-04-10 DIAGNOSIS — Z8249 Family history of ischemic heart disease and other diseases of the circulatory system: Secondary | ICD-10-CM | POA: Diagnosis not present

## 2016-04-10 DIAGNOSIS — I639 Cerebral infarction, unspecified: Secondary | ICD-10-CM

## 2016-04-10 DIAGNOSIS — R9431 Abnormal electrocardiogram [ECG] [EKG]: Secondary | ICD-10-CM

## 2016-04-10 DIAGNOSIS — I63111 Cerebral infarction due to embolism of right vertebral artery: Secondary | ICD-10-CM | POA: Diagnosis not present

## 2016-04-10 DIAGNOSIS — I5021 Acute systolic (congestive) heart failure: Secondary | ICD-10-CM | POA: Diagnosis present

## 2016-04-10 DIAGNOSIS — R471 Dysarthria and anarthria: Secondary | ICD-10-CM | POA: Diagnosis present

## 2016-04-10 DIAGNOSIS — Z23 Encounter for immunization: Secondary | ICD-10-CM

## 2016-04-10 DIAGNOSIS — F329 Major depressive disorder, single episode, unspecified: Secondary | ICD-10-CM | POA: Diagnosis present

## 2016-04-10 DIAGNOSIS — R4182 Altered mental status, unspecified: Secondary | ICD-10-CM | POA: Diagnosis not present

## 2016-04-10 DIAGNOSIS — I161 Hypertensive emergency: Secondary | ICD-10-CM | POA: Diagnosis not present

## 2016-04-10 DIAGNOSIS — I2510 Atherosclerotic heart disease of native coronary artery without angina pectoris: Secondary | ICD-10-CM | POA: Diagnosis present

## 2016-04-10 DIAGNOSIS — I25110 Atherosclerotic heart disease of native coronary artery with unstable angina pectoris: Secondary | ICD-10-CM | POA: Diagnosis not present

## 2016-04-10 DIAGNOSIS — E119 Type 2 diabetes mellitus without complications: Secondary | ICD-10-CM | POA: Diagnosis not present

## 2016-04-10 DIAGNOSIS — J9601 Acute respiratory failure with hypoxia: Secondary | ICD-10-CM | POA: Diagnosis not present

## 2016-04-10 DIAGNOSIS — E876 Hypokalemia: Secondary | ICD-10-CM | POA: Diagnosis not present

## 2016-04-10 DIAGNOSIS — I63232 Cerebral infarction due to unspecified occlusion or stenosis of left carotid arteries: Secondary | ICD-10-CM | POA: Diagnosis not present

## 2016-04-10 DIAGNOSIS — Z4682 Encounter for fitting and adjustment of non-vascular catheter: Secondary | ICD-10-CM | POA: Diagnosis not present

## 2016-04-10 DIAGNOSIS — Z87891 Personal history of nicotine dependence: Secondary | ICD-10-CM

## 2016-04-10 DIAGNOSIS — J810 Acute pulmonary edema: Secondary | ICD-10-CM | POA: Diagnosis present

## 2016-04-10 DIAGNOSIS — I63231 Cerebral infarction due to unspecified occlusion or stenosis of right carotid arteries: Secondary | ICD-10-CM | POA: Diagnosis not present

## 2016-04-10 DIAGNOSIS — R42 Dizziness and giddiness: Secondary | ICD-10-CM | POA: Diagnosis not present

## 2016-04-10 DIAGNOSIS — I6340 Cerebral infarction due to embolism of unspecified cerebral artery: Secondary | ICD-10-CM | POA: Diagnosis not present

## 2016-04-10 HISTORY — PX: CARDIAC CATHETERIZATION: SHX172

## 2016-04-10 LAB — URINALYSIS, ROUTINE W REFLEX MICROSCOPIC
Bilirubin Urine: NEGATIVE
GLUCOSE, UA: 250 mg/dL — AB
Hgb urine dipstick: NEGATIVE
KETONES UR: NEGATIVE mg/dL
LEUKOCYTES UA: NEGATIVE
NITRITE: NEGATIVE
PROTEIN: NEGATIVE mg/dL
Specific Gravity, Urine: 1.02 (ref 1.005–1.030)
pH: 5.5 (ref 5.0–8.0)

## 2016-04-10 LAB — I-STAT TROPONIN, ED: Troponin i, poc: 0.15 ng/mL (ref 0.00–0.08)

## 2016-04-10 LAB — POCT I-STAT 3, ART BLOOD GAS (G3+)
ACID-BASE DEFICIT: 1 mmol/L (ref 0.0–2.0)
Bicarbonate: 24.3 mmol/L (ref 20.0–28.0)
O2 SAT: 100 %
PH ART: 7.376 (ref 7.350–7.450)
TCO2: 25 mmol/L (ref 0–100)
pCO2 arterial: 41.4 mmHg (ref 32.0–48.0)
pO2, Arterial: 183 mmHg — ABNORMAL HIGH (ref 83.0–108.0)

## 2016-04-10 LAB — I-STAT CHEM 8, ED
BUN: 22 mg/dL — ABNORMAL HIGH (ref 6–20)
CHLORIDE: 103 mmol/L (ref 101–111)
Calcium, Ion: 1 mmol/L — ABNORMAL LOW (ref 1.15–1.40)
Creatinine, Ser: 0.6 mg/dL (ref 0.44–1.00)
Glucose, Bld: 222 mg/dL — ABNORMAL HIGH (ref 65–99)
HEMATOCRIT: 38 % (ref 36.0–46.0)
Hemoglobin: 12.9 g/dL (ref 12.0–15.0)
POTASSIUM: 3.5 mmol/L (ref 3.5–5.1)
SODIUM: 137 mmol/L (ref 135–145)
TCO2: 23 mmol/L (ref 0–100)

## 2016-04-10 LAB — DIFFERENTIAL
BASOS ABS: 0.1 10*3/uL (ref 0.0–0.1)
BASOS PCT: 1 %
Eosinophils Absolute: 0.2 10*3/uL (ref 0.0–0.7)
Eosinophils Relative: 2 %
Lymphocytes Relative: 30 %
Lymphs Abs: 2.4 10*3/uL (ref 0.7–4.0)
MONO ABS: 0.7 10*3/uL (ref 0.1–1.0)
Monocytes Relative: 9 %
NEUTROS ABS: 4.5 10*3/uL (ref 1.7–7.7)
Neutrophils Relative %: 58 %

## 2016-04-10 LAB — CBC
HCT: 36 % (ref 36.0–46.0)
Hemoglobin: 11.9 g/dL — ABNORMAL LOW (ref 12.0–15.0)
MCH: 28.5 pg (ref 26.0–34.0)
MCHC: 33.1 g/dL (ref 30.0–36.0)
MCV: 86.3 fL (ref 78.0–100.0)
PLATELETS: 446 10*3/uL — AB (ref 150–400)
RBC: 4.17 MIL/uL (ref 3.87–5.11)
RDW: 14.8 % (ref 11.5–15.5)
WBC: 7.7 10*3/uL (ref 4.0–10.5)

## 2016-04-10 LAB — COMPREHENSIVE METABOLIC PANEL
ALT: 13 U/L — AB (ref 14–54)
AST: 19 U/L (ref 15–41)
Albumin: 3.3 g/dL — ABNORMAL LOW (ref 3.5–5.0)
Alkaline Phosphatase: 88 U/L (ref 38–126)
Anion gap: 12 (ref 5–15)
BUN: 20 mg/dL (ref 6–20)
CHLORIDE: 103 mmol/L (ref 101–111)
CO2: 20 mmol/L — AB (ref 22–32)
CREATININE: 0.73 mg/dL (ref 0.44–1.00)
Calcium: 9.3 mg/dL (ref 8.9–10.3)
GFR calc non Af Amer: >60 mL/min (ref >60)
Glucose, Bld: 220 mg/dL — ABNORMAL HIGH (ref 65–99)
POTASSIUM: 3.5 mmol/L (ref 3.5–5.1)
SODIUM: 135 mmol/L (ref 135–145)
Total Bilirubin: 0.6 mg/dL (ref 0.3–1.2)
Total Protein: 7.4 g/dL (ref 6.5–8.1)

## 2016-04-10 LAB — RAPID URINE DRUG SCREEN, HOSP PERFORMED
Amphetamines: NOT DETECTED
BENZODIAZEPINES: NOT DETECTED
Barbiturates: NOT DETECTED
COCAINE: NOT DETECTED
OPIATES: NOT DETECTED
Tetrahydrocannabinol: POSITIVE — AB

## 2016-04-10 LAB — GLUCOSE, CAPILLARY
GLUCOSE-CAPILLARY: 360 mg/dL — AB (ref 65–99)
GLUCOSE-CAPILLARY: 363 mg/dL — AB (ref 65–99)
Glucose-Capillary: 308 mg/dL — ABNORMAL HIGH (ref 65–99)

## 2016-04-10 LAB — APTT: APTT: 29 s (ref 24–36)

## 2016-04-10 LAB — TRIGLYCERIDES: TRIGLYCERIDES: 203 mg/dL — AB (ref <150)

## 2016-04-10 LAB — CBG MONITORING, ED: GLUCOSE-CAPILLARY: 251 mg/dL — AB (ref 65–99)

## 2016-04-10 LAB — PROTIME-INR
INR: 0.99
PROTHROMBIN TIME: 13.1 s (ref 11.4–15.2)

## 2016-04-10 LAB — MRSA PCR SCREENING: MRSA BY PCR: NEGATIVE

## 2016-04-10 LAB — ETHANOL

## 2016-04-10 SURGERY — LEFT HEART CATH AND CORONARY ANGIOGRAPHY

## 2016-04-10 MED ORDER — PROPOFOL 1000 MG/100ML IV EMUL
5.0000 ug/kg/min | INTRAVENOUS | Status: DC
  Administered 2016-04-10: 65 ug/kg/min via INTRAVENOUS
  Administered 2016-04-10: 40 ug/kg/min via INTRAVENOUS
  Administered 2016-04-10: 30 ug/kg/min via INTRAVENOUS
  Administered 2016-04-10 – 2016-04-11 (×2): 65 ug/kg/min via INTRAVENOUS
  Administered 2016-04-11: 25 ug/kg/min via INTRAVENOUS
  Administered 2016-04-11: 55 ug/kg/min via INTRAVENOUS
  Filled 2016-04-10: qty 100
  Filled 2016-04-10: qty 200
  Filled 2016-04-10 (×2): qty 100

## 2016-04-10 MED ORDER — LABETALOL HCL 5 MG/ML IV SOLN
INTRAVENOUS | Status: AC
  Filled 2016-04-10: qty 4

## 2016-04-10 MED ORDER — IOPAMIDOL (ISOVUE-370) INJECTION 76%
INTRAVENOUS | Status: DC | PRN
  Administered 2016-04-10: 125 mL via INTRA_ARTERIAL

## 2016-04-10 MED ORDER — CHLORHEXIDINE GLUCONATE 0.12% ORAL RINSE (MEDLINE KIT): 15.0000 mL | Freq: Two times a day (BID) | OROMUCOSAL | Status: DC

## 2016-04-10 MED ORDER — SODIUM CHLORIDE 0.9 % IV SOLN
INTRAVENOUS | Status: DC
  Administered 2016-04-10: 3 [IU]/h via INTRAVENOUS
  Filled 2016-04-10: qty 2.5

## 2016-04-10 MED ORDER — HEPARIN (PORCINE) IN NACL 100-0.45 UNIT/ML-% IJ SOLN
850.0000 [IU]/h | INTRAMUSCULAR | Status: DC
  Administered 2016-04-10: 850 [IU]/h via INTRAVENOUS
  Filled 2016-04-10: qty 250

## 2016-04-10 MED ORDER — ORAL CARE MOUTH RINSE
15.0000 mL | OROMUCOSAL | Status: DC
  Administered 2016-04-10 – 2016-04-11 (×9): 15 mL via OROMUCOSAL

## 2016-04-10 MED ORDER — HEPARIN BOLUS VIA INFUSION
4000.0000 [IU] | Freq: Once | INTRAVENOUS | Status: AC
  Administered 2016-04-10: 4000 [IU] via INTRAVENOUS
  Filled 2016-04-10: qty 4000

## 2016-04-10 MED ORDER — FUROSEMIDE 10 MG/ML IJ SOLN
INTRAMUSCULAR | Status: AC
  Filled 2016-04-10: qty 4

## 2016-04-10 MED ORDER — SODIUM CHLORIDE 0.9 % IV SOLN
INTRAVENOUS | Status: AC
  Administered 2016-04-10: 19:00:00 via INTRAVENOUS

## 2016-04-10 MED ORDER — SODIUM CHLORIDE 0.9 % IV SOLN: 250.0000 mL | INTRAVENOUS | Status: DC | PRN

## 2016-04-10 MED ORDER — ASPIRIN 300 MG RE SUPP
300.0000 mg | Freq: Once | RECTAL | Status: AC
  Administered 2016-04-10: 300 mg via RECTAL
  Filled 2016-04-10: qty 1

## 2016-04-10 MED ORDER — ATORVASTATIN CALCIUM 80 MG PO TABS
80.0000 mg | ORAL_TABLET | Freq: Every day | ORAL | Status: DC
  Administered 2016-04-10 – 2016-04-14 (×5): 80 mg via ORAL
  Filled 2016-04-10 (×5): qty 1

## 2016-04-10 MED ORDER — LIDOCAINE HCL (PF) 1 % IJ SOLN
INTRAMUSCULAR | Status: AC
  Filled 2016-04-10: qty 30

## 2016-04-10 MED ORDER — HEPARIN (PORCINE) IN NACL 2-0.9 UNIT/ML-% IJ SOLN
INTRAMUSCULAR | Status: AC
  Filled 2016-04-10: qty 1000

## 2016-04-10 MED ORDER — CHLORHEXIDINE GLUCONATE 0.12% ORAL RINSE (MEDLINE KIT)
15.0000 mL | Freq: Two times a day (BID) | OROMUCOSAL | Status: DC
  Administered 2016-04-10 – 2016-04-11 (×2): 15 mL via OROMUCOSAL

## 2016-04-10 MED ORDER — NITROGLYCERIN IN D5W 200-5 MCG/ML-% IV SOLN
INTRAVENOUS | Status: AC
  Filled 2016-04-10: qty 250

## 2016-04-10 MED ORDER — ORAL CARE MOUTH RINSE: 15.0000 mL | Freq: Four times a day (QID) | OROMUCOSAL | Status: DC

## 2016-04-10 MED ORDER — SODIUM CHLORIDE 0.9% FLUSH: 3.0000 mL | INTRAVENOUS | Status: DC | PRN

## 2016-04-10 MED ORDER — PROPOFOL 1000 MG/100ML IV EMUL
INTRAVENOUS | Status: AC
  Filled 2016-04-10: qty 100

## 2016-04-10 MED ORDER — ASPIRIN 81 MG PO CHEW
81.0000 mg | CHEWABLE_TABLET | Freq: Every day | ORAL | Status: DC
  Administered 2016-04-11 – 2016-04-15 (×5): 81 mg via ORAL
  Filled 2016-04-10 (×5): qty 1

## 2016-04-10 MED ORDER — VERAPAMIL HCL 2.5 MG/ML IV SOLN
INTRAVENOUS | Status: AC
  Filled 2016-04-10: qty 2

## 2016-04-10 MED ORDER — NITROGLYCERIN IN D5W 200-5 MCG/ML-% IV SOLN
0.0000 ug/min | INTRAVENOUS | Status: DC
  Administered 2016-04-10: 100 ug/min via INTRAVENOUS

## 2016-04-10 MED ORDER — HEPARIN (PORCINE) IN NACL 2-0.9 UNIT/ML-% IJ SOLN
INTRAMUSCULAR | Status: DC | PRN
  Administered 2016-04-10: 1000 mL

## 2016-04-10 MED ORDER — SODIUM CHLORIDE 0.9% FLUSH
3.0000 mL | Freq: Two times a day (BID) | INTRAVENOUS | Status: DC
  Administered 2016-04-10 – 2016-04-15 (×7): 3 mL via INTRAVENOUS

## 2016-04-10 MED ORDER — FAMOTIDINE IN NACL 20-0.9 MG/50ML-% IV SOLN
20.0000 mg | Freq: Two times a day (BID) | INTRAVENOUS | Status: DC
Start: 2016-04-10 — End: 2016-04-12
  Administered 2016-04-10 – 2016-04-11 (×3): 20 mg via INTRAVENOUS
  Filled 2016-04-10 (×3): qty 50

## 2016-04-10 MED ORDER — CARVEDILOL 6.25 MG PO TABS
6.2500 mg | ORAL_TABLET | Freq: Two times a day (BID) | ORAL | Status: DC
  Administered 2016-04-11 – 2016-04-15 (×9): 6.25 mg via ORAL
  Filled 2016-04-10 (×9): qty 1

## 2016-04-10 MED ORDER — HEPARIN (PORCINE) IN NACL 2-0.9 UNIT/ML-% IJ SOLN
INTRAMUSCULAR | Status: DC | PRN
  Administered 2016-04-10: 10 mL via INTRA_ARTERIAL

## 2016-04-10 MED ORDER — INSULIN ASPART 100 UNIT/ML ~~LOC~~ SOLN: 2.0000 [IU] | SUBCUTANEOUS | Status: DC

## 2016-04-10 MED ORDER — HEPARIN SODIUM (PORCINE) 1000 UNIT/ML IJ SOLN
INTRAMUSCULAR | Status: AC
  Filled 2016-04-10: qty 1

## 2016-04-10 MED ORDER — LABETALOL HCL 5 MG/ML IV SOLN
INTRAVENOUS | Status: DC | PRN
  Administered 2016-04-10: 20 mg via INTRAVENOUS

## 2016-04-10 MED ORDER — IOPAMIDOL (ISOVUE-370) INJECTION 76%
INTRAVENOUS | Status: AC
  Filled 2016-04-10: qty 100

## 2016-04-10 MED ORDER — ETOMIDATE 2 MG/ML IV SOLN
INTRAVENOUS | Status: DC | PRN
  Administered 2016-04-10: 20 mg via INTRAVENOUS

## 2016-04-10 MED ORDER — NICARDIPINE HCL IN NACL 20-0.86 MG/200ML-% IV SOLN
3.0000 mg/h | INTRAVENOUS | Status: DC
  Administered 2016-04-10: 3 mg/h via INTRAVENOUS
  Filled 2016-04-10: qty 200

## 2016-04-10 MED ORDER — FUROSEMIDE 10 MG/ML IJ SOLN
40.0000 mg | Freq: Once | INTRAMUSCULAR | Status: AC
  Administered 2016-04-10: 40 mg via INTRAVENOUS

## 2016-04-10 MED ORDER — ENOXAPARIN SODIUM 40 MG/0.4ML ~~LOC~~ SOLN
40.0000 mg | SUBCUTANEOUS | Status: DC
  Administered 2016-04-11 – 2016-04-15 (×5): 40 mg via SUBCUTANEOUS
  Filled 2016-04-10 (×5): qty 0.4

## 2016-04-10 MED ORDER — ROCURONIUM BROMIDE 50 MG/5ML IV SOLN
INTRAVENOUS | Status: DC | PRN
  Administered 2016-04-10: 100 mg via INTRAVENOUS

## 2016-04-10 MED ORDER — ORAL CARE MOUTH RINSE: 15.0000 mL | OROMUCOSAL | Status: DC

## 2016-04-10 MED ORDER — CHLORHEXIDINE GLUCONATE 0.12% ORAL RINSE (MEDLINE KIT)
15.0000 mL | Freq: Two times a day (BID) | OROMUCOSAL | Status: DC
  Administered 2016-04-10: 15 mL via OROMUCOSAL

## 2016-04-10 MED ORDER — FENTANYL CITRATE (PF) 100 MCG/2ML IJ SOLN
25.0000 ug | INTRAMUSCULAR | Status: DC | PRN
Start: 2016-04-10 — End: 2016-04-15
  Administered 2016-04-10 – 2016-04-11 (×3): 50 ug via INTRAVENOUS
  Filled 2016-04-10 (×3): qty 2

## 2016-04-10 MED ORDER — SODIUM CHLORIDE 0.9 % IV SOLN
INTRAVENOUS | Status: DC | PRN
  Administered 2016-04-10: 5 mL/h via INTRAVENOUS

## 2016-04-10 MED ORDER — LIDOCAINE HCL (PF) 1 % IJ SOLN
INTRAMUSCULAR | Status: DC | PRN
  Administered 2016-04-10: 2 mL

## 2016-04-10 SURGICAL SUPPLY — 12 items
CATH INFINITI 5FR ANG PIGTAIL (CATHETERS) ×2 IMPLANT
CATH OPTITORQUE JACKY 4.0 5F (CATHETERS) ×2 IMPLANT
DEVICE RAD COMP TR BAND LRG (VASCULAR PRODUCTS) ×2 IMPLANT
GLIDESHEATH SLEND SS 6F .021 (SHEATH) ×2 IMPLANT
KIT ENCORE 26 ADVANTAGE (KITS) ×2 IMPLANT
KIT HEART LEFT (KITS) ×3 IMPLANT
PACK CARDIAC CATHETERIZATION (CUSTOM PROCEDURE TRAY) ×3 IMPLANT
SYR MEDRAD MARK V 150ML (SYRINGE) ×3 IMPLANT
TRANSDUCER W/STOPCOCK (MISCELLANEOUS) ×3 IMPLANT
TUBING CIL FLEX 10 FLL-RA (TUBING) ×3 IMPLANT
WIRE EMERALD 3MM-J .035X260CM (WIRE) ×2 IMPLANT
WIRE HI TORQ VERSACORE-J 145CM (WIRE) ×2 IMPLANT

## 2016-04-10 NOTE — ED Notes (Addendum)
On arrival to ED room immediately after head CT pt began having labored respirations and diaphoretic. Pt placed on monitor. EDP and respiratory called to bedside. Preparing to intubate pt. sats in 40's placed on NRB. PT still able to speak and trying to get out of bed. Pt reassured and MD at bedside to explain need to maintain pts airway.

## 2016-04-10 NOTE — Progress Notes (Signed)
Patient's daughter's flight is not arriving until 10/12.    Patient's belongings brought to security department include: Glasses, one gold-colored ring w/ clear jewels, one silver-colored ring w/ clear jewels, 2 silver-colored bracelets, 1 4-color pen, 7 1 dollar bills, 4 quarters, 5 dimes, 5 pennies, 1 nickel  Belongings still with patient include: white colored ball earrings, wig, 2 white tennis shoes, bra, underwear.

## 2016-04-10 NOTE — ED Provider Notes (Signed)
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 Code stroke was called and the patient was transported immediately to the emergency department after an IV was placed in the field. On arrival the patient has no complaints, it was noted that her blood pressure was 200/120 by paramedics. On exam her neurologic exam was unremarkable and nonfocal, she followed all commands without difficulty without any focal weakness numbness or difficulty speaking. As the patient returned from CT scan she had acute onset shortness of breath, tachycardia, severe hypertension and hypoxia, her oxygen dropped to approximately 30% despite nonrebreather, she was not mentating well, she was extremely diaphoretic, her pulmonary exam revealed rales bilaterally and diffusely. She was tachypneic and ill-appearing requiring intubation for severe hypoxia. Please see intubation note below. The patient is critically ill. I repeated her EKG after intubation and it shows that she has an ST elevation MI with the inferior and the lateral precordial leads being involved primarily. I discussed the case with the STEMI cardiologist on-call, Dr. Fredricka BonineMichael Haney who will take the patient to the catheterization lab immediately. Interventions included intubation, nitroglycerin drip, heparin drip, propofol drip, RSI intubation, fully catheter, Lasix. Friend of the patient was at the bedside who was informed of the patient's status and will contact family members.  CRITICAL CARE Performed by: Vida RollerBrian D Johnnie Goynes Total critical care time: 35 minutes Critical care time was exclusive of separately billable procedures and treating other patients. Critical care was necessary to treat or  prevent imminent or life-threatening deterioration. Critical care was time spent personally by me on the following activities: development of treatment plan with patient and/or surrogate as well as nursing, discussions with consultants, evaluation of patient's response to treatment, examination of patient, obtaining history from patient or surrogate, ordering and performing treatments and interventions, ordering and review of laboratory studies, ordering and review of radiographic studies, pulse oximetry and re-evaluation of patient's condition.  INTUBATION Performed by: Vida RollerBrian D Caelen Reierson  Required items: required blood products, implants, devices, and special equipment available Patient identity confirmed: provided demographic data and hospital-assigned identification number Time out: Immediately prior to procedure a "time out" was called to verify the correct patient, procedure, equipment, support staff and site/side marked as required.  Indications: Severe hypoxia and respiratory distress   Intubation method: Direct  Laryngoscopy   Preoxygenation: BVM  Sedatives: 20 mg Etomidate Paralytic: 100 mg rocuronium   Tube Size: 7.5 cuffed  Post-procedure assessment: chest rise and ETCO2 monitor Breath sounds: equal and absent over the epigastrium Tube secured with: ETT holder Chest x-ray interpreted by radiologist and me.  Chest x-ray findins: The patient has frothy pulmonary edema during the intubation process, her x-ray reveals air bronchograms and diffuse pulmonary edema, endotracheal tube was just above the carina, NG tube courses below the diaphragm in an appropriate trajectory.   endotracheal tube in appropriate position  Patient tolerated the procedure well with no immediate complications.   Medical screening examination/treatment/procedure(s) were conducted as a shared visit with non-physician practitioner(s) and myself.  I personally evaluated the patient during the  encounter.  Clinical Impression:   Final diagnoses:  Acute pulmonary edema [facility])  Hypertensive emergency  Eber Hong, MD 04/11/16 1550

## 2016-04-10 NOTE — Consult Note (Signed)
PULMONARY / CRITICAL CARE MEDICINE   Name: Rebecca MeuseValerie S Wagner MRN: [id number] DOB: Oct 06, 1960    ADMISSION DATE:  04/10/2016 CONSULTATION DATE:  04/10/2016  REFERRING MD:  Dr. Kirke CorinArida  CHIEF COMPLAINT:  AMS  HISTORY OF PRESENT ILLNESS:  Patient is encephalopathic and/or intubated. Therefore history has been obtained from chart review. 55 year old female with PMH as below, which is significant for HTN and DM. 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 While at work 10/11 around 1400 she was noted to be dragging her left foot with AMS. [facility] was [facility] as a code stroke and underwent CT scan demonstrating no hemorrhage or clear evidence of acute infarct. Shortly after returning from CT she developed shortness of breath and desaturation and was intubated. ST changes noted during this episode and she was taken to cath lab where diffuse CAD was discovered and no intervention was done. After the cath she was sent to ICU for recovery and further evaluation. PCCM to see.   PAST MEDICAL HISTORY :  She  has a past medical history of Arthritis; Depression; Diabetes mellitus without complication (HCC); and Hypertension.  PAST SURGICAL HISTORY: She  has a past surgical history that includes Cesarean section and laparoscopic appendectomy (N/A, 02/29/2016).  No Known Allergies  No current facility-administered medications on file prior to encounter.    Current Outpatient Prescriptions on File Prior to Encounter  Medication Sig  . CALCIUM-MAGNESIUM-ZINC PO Take 1 tablet by mouth daily.  . fluconazole (DIFLUCAN) 100 MG tablet Take 1 tablet (100 mg total) by mouth daily.  . insulin aspart (NOVOLOG) 100 UNIT/ML injection Inject 5-10 Units into the skin 3 (three) times daily as needed for high blood sugar.   . Lactobacillus (ACIDOPHILUS PO) Take 1-2 tablets by mouth daily.  . Magnesium 250 MG TABS Take 1 tablet by mouth daily.  Marland Kitchen. oxyCODONE (OXY IR/ROXICODONE) 5 MG immediate release  tablet Take 1-2 tablets (5-10 mg total) by mouth every 6 (six) hours as needed for severe pain.  Marland Kitchen. oxyCODONE-acetaminophen (PERCOCET/ROXICET) 5-325 MG tablet Take 1 tablet by mouth every 6 (six) hours as needed for moderate pain or severe pain.    FAMILY HISTORY:  Her indicated that her mother is deceased. She indicated that her father is deceased.    SOCIAL HISTORY: She  reports that she has quit smoking. She has never used smokeless tobacco. She reports that she does not drink alcohol or use drugs.  REVIEW OF SYSTEMS:   unable  SUBJECTIVE:    VITAL SIGNS: BP (!) 202/127   Pulse (!) 0   Resp 15   Ht 5\' 7"  (1.702 m)   Wt 75.5 kg (166 lb 7.2 oz)   SpO2 99%   BMI 26.07 kg/m   HEMODYNAMICS:    VENTILATOR SETTINGS: Vent Mode: PRVC FiO2 (%):  [70 %-100 %] 70 % Set Rate:  [16 bmp-22 bmp] 16 bmp Vt Set:  [500 mL-550 mL] 500 mL PEEP:  [10 cmH20-15 cmH20] 10 cmH20 Plateau Pressure:  [15 cmH20-31 cmH20] 15 cmH20  INTAKE / OUTPUT: No intake/output data recorded.  PHYSICAL EXAMINATION: General:  Female of normal body habitus in NAD Neuro:  Awake on vent, follows commands.  HEENT:  [place]/AT. PERRL, mild JVD Cardiovascular: RRR, no MRG Lungs:  Coarse Abdomen:  Soft, non-distended Musculoskeletal:  No acute deformity or ROM limitation.  Skin:  Grossly intact  LABS:  BMET  Recent Labs Lab 04/10/16 1525 04/10/16 1534  NA 135 137  K  3.5 3.5  CL 103 103  CO2 20*  --   BUN 20 22*  CREATININE 0.73 0.60  GLUCOSE 220* 222*    Electrolytes  Recent Labs Lab 04/10/16 1525  CALCIUM 9.3    CBC  Recent Labs Lab 04/10/16 1525 04/10/16 1534  WBC 7.7  --   HGB 11.9* 12.9  HCT 36.0 38.0  PLT 446*  --     Coag's  Recent Labs Lab 04/10/16 1525  APTT 29  INR 0.99    Sepsis Markers No results for input(s): LATICACIDVEN, PROCALCITON, O2SATVEN in the last 168 hours.  ABG  Recent Labs Lab 04/10/16 1740  PHART 7.376  PCO2ART 41.4  PO2ART 183.0*     Liver Enzymes  Recent Labs Lab 04/10/16 1525  AST 19  ALT 13*  ALKPHOS 88  BILITOT 0.6  ALBUMIN 3.3*    Cardiac Enzymes No results for input(s): TROPONINI, PROBNP in the last 168 hours.  Glucose  Recent Labs Lab 04/10/16 1548  GLUCAP 251*    Imaging Dg Chest Portable 1 View  Result Date: 04/10/2016 CLINICAL DATA:  Check endotracheal tube placement EXAM: PORTABLE CHEST 1 VIEW COMPARISON:  None. FINDINGS: Endotracheal tube is noted 4.4 cm above the carina. The cardiac shadow is at the upper limits of normal in size. Diffuse increased density is noted throughout both lungs likely related to diffuse pulmonary edema. No sizable effusions are seen. No bony abnormality is noted. Nasogastric catheter courses into the stomach. IMPRESSION: Endotracheal tube and nasogastric catheter in satisfactory position. Diffuse increased density throughout both lungs likely related to underlying pulmonary edema. Electronically Signed   By: Alcide Clever M.D.   On: 04/10/2016 16:19   Ct Head Code Stroke W/o Cm  Result Date: 04/10/2016 CLINICAL DATA:  Code stroke. Code stroke. Confusion. Left leg weakness. Diabetes. EXAM: CT HEAD WITHOUT CONTRAST TECHNIQUE: Contiguous axial images were obtained from the base of the skull through the vertex without intravenous contrast. COMPARISON:  None. FINDINGS: Brain: There are advanced chronic small vessel ischemic changes throughout the brain. No identifiable acute infarction, mass lesion, hemorrhage, hydrocephalus or extra-axial collection. Vascular: There is atherosclerotic calcification of the major vessels at the base of the brain. Skull: Normal Sinuses/Orbits: Clear/normal Other: None significant ASPECTS (Alberta Stroke Program Early CT Score) - Ganglionic level infarction (caudate, lentiform nuclei, internal capsule, insula, M1-M3 cortex): 7 - Supraganglionic infarction (M4-M6 cortex): 3 Total score (0-10 with 10 being normal): 10 IMPRESSION: 1. Advanced  chronic small vessel ischemic changes throughout the brain. No sign of acute cortical or large vessel infarction. There is atherosclerotic calcification of the major vessels at the base of the brain. 2. ASPECTS is 10. These results were called by telephone at the time of interpretation on 04/10/2016 at 3:47 pm to Dr. Amada Jupiter, who verbally acknowledged these results. Electronically Signed   By: Paulina Fusi M.D.   On: 04/10/2016 15:48     STUDIES:  CT head 10/11 > Advanced chronic small vessel ischemic changes throughout the brain. No sign of acute cortical or large vessel infarction. There is atherosclerotic calcification of the major vessels at the base of the brain. LHC 10/11> Severe, diffuse, heavily calcified 3 vessel disease in a typical diabetic pattern affecting branches and distal vessels. No clear culprit for unstable angina or myocardial infarction. Mild to moderately decreased LV systolic function with an ejection fraction of 40% with akinesis of the apical segments. 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 However, LAD ischemia cannot be excluded given that the  LAD wraps around the apex.  CULTURES:   ANTIBIOTICS:   SIGNIFICANT EVENTS: 10/11 > AMS at work, code stroke, ST changes to cath lab, no intervention. Out to ICU on vent.   LINES/TUBES: ETT 10/11  DISCUSSION: 55 year old female ASSESSMENT / PLAN:  PULMONARY A: Acute hypoxemic respiratory failure  P:   Full vent support Titrate FiO2 to keep sats > 92% ABG VAP bundle  CARDIOVASCULAR A:  Severe 3 vessel CAD, ? 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% Hypertensive emergency  P:  Telemetry SBP goal 150-170 Nicardipine infusion NTG infusion low threshold to DC Management per cardiology Troponin trending  RENAL A:   No acute issues Risk for AKI due to dye load with cath  P:   Gentle hydration Follow BMP  GASTROINTESTINAL A:   No acute issues  P:   NPO Pepcid for  SUP  HEMATOLOGIC A:   Anemia, mild  P:  Follow BMP  INFECTIOUS A:   No acute issues  P:   Follow WBC and fever curves  ENDOCRINE A:   DM  P:   CBG monitoring and SSI  NEUROLOGIC A:   No acute deformity or ROM limitation ?Acute CVA ?PRES  P:   RASS goal: -1 Propofol infusion prn fentanyl for sedation Neurology following   FAMILY  - Updates:   - Inter-disciplinary family meet or Palliative Care meeting due by:  10/18   Joneen Roach, AGACNP-BC [place] Pulmonology/Critical Care Pager [phone] or [phone]  04/10/2016 6:17 PM  Attending Note:  55 year old female with PMH above presenting after being noticed dragging her leg.  Brought to ED, CT of the head that I reviewed myself was negative.  C/O CP and ST segment changes were noted on EKG with subsequent SOB.  Patient was intubated and sent to the cath lab where she was noted to have extensive CAD but no interventions were indicated at that time.  PCCM was consulted for vent management and medical management.  On exam, diffuse crackles were noted, patient was sedated and intubated.  ABG noted, decrease PEEP to 10 and FiO2 to 70%.  F/U ABG ordered.  Maintain NPO for now.  BP control with cardene drip since SBP is in the 200's.  Begin PO anti-HTN medications.  PCCM will continue to follow.  Hope is to diurese enough to be able to decrease pulmonary edema but will not push that for now given contrast during cath.  Note for services rendered 10/11.  The patient is critically ill with multiple organ systems failure and requires high complexity decision making for assessment and support, frequent evaluation and titration of therapies, application of advanced monitoring technologies and extensive interpretation of multiple databases.   Critical Care Time devoted to patient care services described in this note is  35  Minutes. This time reflects time of care of this signee Dr Koren Bound. This critical care time  does not reflect procedure time, or teaching time or supervisory time of PA/NP/Med student/Med Resident etc but could involve care discussion time.  Alyson Reedy, M.D. [facility] Pulmonary/Critical Care Medicine. Pager: [phone]. After hours pager: [phone].

## 2016-04-10 NOTE — Progress Notes (Addendum)
Patient's belongings at bedside include: (and stored at nursing secretary desk)  Glasses, one gold-colored ring w/ clear jewels, one silver-colored ring w/ clear jewels, 2 silver-colored bracelets, 1 4-color pen, 7 1 dollar bills, 4 quarters, 5 dimes, 5 pennies, 1 nickel, pearl earrings, wig, 2 white tennis shoes, bra, underwear.  [place].  When she arrives, belongings given to her. [place], Mitzi HansenJessica Marie

## 2016-04-10 NOTE — H&P (Signed)
History & Physical    Patient ID: Rebecca Wagner MRN: [id number], DOB/AGE: 08-13-1960   Admit date: 04/10/2016   Primary Physician: [name], MD Primary Cardiologist: New  Patient Profile    55 yo female with PMH of DM, HTN, depression and arthritis who presented to the [facility] ED as a code stroke and found to be a STEMI.   Past Medical History    Past Medical History:  Diagnosis Date  . Arthritis   . Depression   . Diabetes mellitus without complication (HCC)   . Hypertension     Past Surgical History:  Procedure Laterality Date  . CESAREAN SECTION    . LAPAROSCOPIC APPENDECTOMY N/A 02/29/2016   Procedure: LAPAROSCOPIC APPENDECTOMY;  Surgeon: Axel Filler, MD;  Location: [facility] OR;  Service: General;  Laterality: N/A;     Allergies  No Known Allergies  History of Present Illness    Mr. Kovacik is a 55 yo female with no known cardiac hx who works as a Engineer, civil (consulting) at [employer]. While working today she had a sudden onset of dizziness, and began dragging her left feet around 2pm. She was immediately transported to the ED as a code stroke. Blood pressure was noted 200/120 via EMS. She initially denied any anginal symptoms or dyspnea. She was dysarthric on arrival. On arrival she was taken to CT scan which showed no acute abnormalities. After having her CT she began to feel dyspneic and diaphoretic. O2 sats were noted to drop into the 30%. Exam via EDP showed rales bilaterally, and required intubation for severe hypoxia. Repeat EKG was obtained showing ST elevation in the inferior and lateral leads. She was placed on IV nitro, heparin and propofol while in the ED. Labs in the ED showed stable electrolytes, trop 0.15, hgb 12.9.   Home Medications    Prior to Admission medications   Medication Sig Start Date End Date Taking? Authorizing Provider  CALCIUM-MAGNESIUM-ZINC PO Take 1 tablet by mouth daily.    Historical Provider, MD  fluconazole (DIFLUCAN) 100 MG tablet  Take 1 tablet (100 mg total) by mouth daily. 03/02/16   Claud Kelp, MD  insulin aspart (NOVOLOG) 100 UNIT/ML injection Inject 5-10 Units into the skin 3 (three) times daily as needed for high blood sugar.     Historical Provider, MD  Lactobacillus (ACIDOPHILUS PO) Take 1-2 tablets by mouth daily.    Historical Provider, MD  Magnesium 250 MG TABS Take 1 tablet by mouth daily.    Historical Provider, MD  oxyCODONE (OXY IR/ROXICODONE) 5 MG immediate release tablet Take 1-2 tablets (5-10 mg total) by mouth every 6 (six) hours as needed for severe pain. 03/02/16   Claud Kelp, MD  oxyCODONE-acetaminophen (PERCOCET/ROXICET) 5-325 MG tablet Take 1 tablet by mouth every 6 (six) hours as needed for moderate pain or severe pain. 03/01/16   Adam Phenix, PA-C    Family History    Family History  Problem Relation Age of Onset  . Diabetes Mother   . Heart disease Father     Social History    Social History   Social History  . Marital status: Divorced    Spouse name: N/A  . Number of children: N/A  . Years of education: N/A   Occupational History  . Not on file.   Social History Main Topics  . Smoking status: Former Games developer  . Smokeless tobacco: Never Used     Comment: quit smoking in 2010  . Alcohol use No  .  Drug use: No  . Sexual activity: Not on file   Other Topics Concern  . Not on file   Social History Narrative  . No narrative on file     Review of Systems    General:  No chills, fever, night sweats or weight changes.  Cardiovascular:  See HPI Dermatological: No rash, lesions/masses Respiratory: No cough, dyspnea Urologic: No hematuria, dysuria Abdominal:   No nausea, vomiting, diarrhea, bright red blood per rectum, melena, or hematemesis Neurologic: See HPI All other systems reviewed and are otherwise negative except as noted above.  Physical Exam    Blood pressure (!) 186/115, pulse (!) 121, resp. rate 22, height 5\' 7"  (1.702 m), weight 166 lb 7.2 oz (75.5  kg), SpO2 95 %.  Physical Exam per MD:    Labs    Troponin (Point of Care Test)  Recent Labs  04/10/16 1533  TROPIPOC 0.15*   No results for input(s): CKTOTAL, CKMB, TROPONINI in the last 72 hours. Lab Results  Component Value Date   WBC 7.7 04/10/2016   HGB 12.9 04/10/2016   HCT 38.0 04/10/2016   MCV 86.3 04/10/2016   PLT 446 (H) 04/10/2016    Recent Labs Lab 04/10/16 1525 04/10/16 1534  NA 135 137  K 3.5 3.5  CL 103 103  CO2 20*  --   BUN 20 22*  CREATININE 0.73 0.60  CALCIUM 9.3  --   PROT 7.4  --   BILITOT 0.6  --   ALKPHOS 88  --   ALT 13*  --   AST 19  --   GLUCOSE 220* 222*   No results found for: CHOL, HDL, LDLCALC, TRIG No results found for: [facility]   Radiology Studies    Dg Chest Portable 1 View  Result Date: 04/10/2016 CLINICAL DATA:  Check endotracheal tube placement EXAM: PORTABLE CHEST 1 VIEW COMPARISON:  None. FINDINGS: Endotracheal tube is noted 4.4 cm above the carina. The cardiac shadow is at the upper limits of normal in size. Diffuse increased density is noted throughout both lungs likely related to diffuse pulmonary edema. No sizable effusions are seen. No bony abnormality is noted. Nasogastric catheter courses into the stomach. IMPRESSION: Endotracheal tube and nasogastric catheter in satisfactory position. Diffuse increased density throughout both lungs likely related to underlying pulmonary edema. Electronically Signed   By: Alcide CleverMark  Lukens M.D.   On: 04/10/2016 16:19   Ct Head Code Stroke W/o Cm  Result Date: 04/10/2016 CLINICAL DATA:  Code stroke. Code stroke. Confusion. Left leg weakness. Diabetes. EXAM: CT HEAD WITHOUT CONTRAST TECHNIQUE: Contiguous axial images were obtained from the base of the skull through the vertex without intravenous contrast. COMPARISON:  None. FINDINGS: Brain: There are advanced chronic small vessel ischemic changes throughout the brain. No identifiable acute infarction, mass lesion, hemorrhage, hydrocephalus or  extra-axial collection. Vascular: There is atherosclerotic calcification of the major vessels at the base of the brain. Skull: Normal Sinuses/Orbits: Clear/normal Other: None significant ASPECTS (Alberta Stroke Program Early CT Score) - Ganglionic level infarction (caudate, lentiform nuclei, internal capsule, insula, M1-M3 cortex): 7 - Supraganglionic infarction (M4-M6 cortex): 3 Total score (0-10 with 10 being normal): 10 IMPRESSION: 1. Advanced chronic small vessel ischemic changes throughout the brain. No sign of acute cortical or large vessel infarction. There is atherosclerotic calcification of the major vessels at the base of the brain. 2. ASPECTS is 10. These results were called by telephone at the time of interpretation on 04/10/2016 at 3:47 pm to Dr. Amada JupiterKirkpatrick, who  verbally acknowledged these results. Electronically Signed   By: Paulina Fusi M.D.   On: 04/10/2016 15:48    ECG & Cardiac Imaging    EKG: SR with ST elevation in the inferior and lateral leads.  Assessment & Plan    55 yo female with PMH of DM, HTN, depression and arthritis who presented to the [facility] ED as a code stroke and found to be a STEMI.   1. STEMI: She was intubated in the ED given her severe hypoxia, and brought to the cath lab emergently for cardiac cath with Dr. Kirke Corin. Further recommends to follow procedure.   Janice Coffin, NP-C Pager [phone] 04/10/2016, 4:51 PM

## 2016-04-10 NOTE — ED Provider Notes (Signed)
[facility]-EM[facility]Y DEPT Provider Note   CSN: [id number] Arrival date & time: 04/10/16  1526     History   Chief Complaint No chief complaint on file.   HPI Rebecca Wagner is a 55 y.o. female.  Patient is 55 yo F with PMH of diabetes and hypertension, presenting to ED via EMS for possible Code Stroke. Patient was working as Engineer, civil (consulting)nurse at [employer], reported being dizzy earlier in the day, and was noted to be dragging her left foot around 2:30 PM. Immediately transported to ED, and blood pressure noted to be 200/120 by paramedics. Upon meeting patient after CT, patient denied any complaints including chest pain or back pain; however, unable to obtain complete history from patient, and level 5 caveat applies secondary to patient's acute respiratory distress.      Past Medical History:  Diagnosis Date  . Arthritis   . Depression   . Diabetes mellitus without complication (HCC)   . Hypertension     Patient Active Problem List   Diagnosis Date Noted  . Acute appendicitis 02/29/2016    Past Surgical History:  Procedure Laterality Date  . CESAREAN SECTION    . LAPAROSCOPIC APPENDECTOMY N/A 02/29/2016   Procedure: LAPAROSCOPIC APPENDECTOMY;  Surgeon: Axel FillerArmando Ramirez, MD;  Location: [facility] OR;  Service: General;  Laterality: N/A;    OB History    No data available       Home Medications    Prior to Admission medications   Medication Sig Start Date End Date Taking? Authorizing Provider  CALCIUM-MAGNESIUM-ZINC PO Take 1 tablet by mouth daily.    Historical Provider, MD  fluconazole (DIFLUCAN) 100 MG tablet Take 1 tablet (100 mg total) by mouth daily. 03/02/16   Claud KelpHaywood Ingram, MD  insulin aspart (NOVOLOG) 100 UNIT/ML injection Inject 5-10 Units into the skin 3 (three) times daily as needed for high blood sugar.     Historical Provider, MD  Lactobacillus (ACIDOPHILUS PO) Take 1-2 tablets by mouth daily.    Historical Provider, MD  Magnesium 250 MG TABS Take 1 tablet by mouth  daily.    Historical Provider, MD  oxyCODONE (OXY IR/ROXICODONE) 5 MG immediate release tablet Take 1-2 tablets (5-10 mg total) by mouth every 6 (six) hours as needed for severe pain. 03/02/16   Claud KelpHaywood Ingram, MD  oxyCODONE-acetaminophen (PERCOCET/ROXICET) 5-325 MG tablet Take 1 tablet by mouth every 6 (six) hours as needed for moderate pain or severe pain. 03/01/16   Adam PhenixElizabeth S Simaan, PA-C    Family History Family History  Problem Relation Age of Onset  . Diabetes Mother   . Heart disease Father     Social History Social History  Substance Use Topics  . Smoking status: Former Games developermoker  . Smokeless tobacco: Never Used     Comment: quit smoking in 2010  . Alcohol use No     Allergies   Review of patient's allergies indicates no known allergies.   Review of Systems Review of Systems  Unable to perform ROS: Acuity of condition     Physical Exam Updated Vital Signs BP (!) 186/115   Pulse (!) 121   Resp 22   Wt 75.5 kg   SpO2 (!) 78%   BMI 28.57 kg/m   Physical Exam  Constitutional:  Middle aged female, diaphoretic, tachycardic, tachypneic, acute respiratory distress with O2 sat 30% on NRB.  HENT:  Head: Normocephalic and atraumatic.  Unable to assess oropharynx, patient on NRB.  Eyes: Conjunctivae are normal.  Neck: Normal range of motion.  Cardiovascular: Intact distal pulses.  Tachycardia present.   Pulmonary/Chest: Tachypnea noted. She is in respiratory distress.  Diffuse rales bilaterally.  Abdominal: Soft. There is no tenderness.  Musculoskeletal: Normal range of motion.  Patient moved all extremities without difficulty. Edema noted to bilateral lower extremities.  Neurological:  Patient not mentating well. Per Neurology, unremarkable neuro exam upon meeting patient in trauma bay.    Nursing note and vitals reviewed.  ED Treatments / Results  Labs (all labs ordered are listed, but only abnormal results are displayed) Labs Reviewed  CBC - Abnormal;  Notable for the following:       Result Value   Hemoglobin 11.9 (*)    Platelets 446 (*)    All other components within normal limits  COMPREHENSIVE METABOLIC PANEL - Abnormal; Notable for the following:    CO2 20 (*)    Glucose, Bld 220 (*)    Albumin 3.3 (*)    ALT 13 (*)    All other components within normal limits  I-STAT CHEM 8, ED - Abnormal; Notable for the following:    BUN 22 (*)    Glucose, Bld 222 (*)    Calcium, Ion 1.00 (*)    All other components within normal limits  I-STAT TROPOININ, ED - Abnormal; Notable for the following:    Troponin i, poc 0.15 (*)    All other components within normal limits  CBG MONITORING, ED - Abnormal; Notable for the following:    Glucose-Capillary 251 (*)    All other components within normal limits  ETHANOL  PROTIME-INR  APTT  DIFFERENTIAL  RAPID URINE DRUG SCREEN, HOSP PERFORMED  URINALYSIS, ROUTINE W REFLEX MICROSCOPIC (NOT AT [facility])  TROPONIN I  TRIGLYCERIDES    EKG  EKG Interpretation  Date/Time:  Wednesday April 10 2016 15:48:37 EDT Ventricular Rate:  119 PR Interval:    QRS Duration: 99 QT Interval:  338 QTC Calculation: 476 R Axis:   -27 Text Interpretation:  Sinus tachycardia Left ventricular hypertrophy Anterior infarct, acute (LAD) ST elevation, consider inferior injury Poor R wave progression Abnormal ekg Confirmed by Hyacinth Meeker  MD, BRIAN ([id number]) on 04/10/2016 4:19:27 PM       Radiology Ct Head Code Stroke W/o Cm  Result Date: 04/10/2016 CLINICAL DATA:  Code stroke. Code stroke. Confusion. Left leg weakness. Diabetes. EXAM: CT HEAD WITHOUT CONTRAST TECHNIQUE: Contiguous axial images were obtained from the base of the skull through the vertex without intravenous contrast. COMPARISON:  None. FINDINGS: Brain: There are advanced chronic small vessel ischemic changes throughout the brain. No identifiable acute infarction, mass lesion, hemorrhage, hydrocephalus or extra-axial collection. Vascular: There is  atherosclerotic calcification of the major vessels at the base of the brain. Skull: Normal Sinuses/Orbits: Clear/normal Other: None significant ASPECTS (Alberta Stroke Program Early CT Score) - Ganglionic level infarction (caudate, lentiform nuclei, internal capsule, insula, M1-M3 cortex): 7 - Supraganglionic infarction (M4-M6 cortex): 3 Total score (0-10 with 10 being normal): 10 IMPRESSION: 1. Advanced chronic small vessel ischemic changes throughout the brain. No sign of acute cortical or large vessel infarction. There is atherosclerotic calcification of the major vessels at the base of the brain. 2. ASPECTS is 10. These results were called by telephone at the time of interpretation on 04/10/2016 at 3:47 pm to Dr. Amada Jupiter, who verbally acknowledged these results. Electronically Signed   By: Paulina Fusi M.D.   On: 04/10/2016 15:48    Procedures .Critical Care Performed by: DE Marshia Ly F Authorized by: Lajean Silvius  F     (including critical care time)  Medications Ordered in ED Medications  nitroGLYCERIN 0.2 mg/mL in dextrose 5 % infusion (not administered)  nitroGLYCERIN 50 mg in dextrose 5 % 250 mL (0.2 mg/mL) infusion (100 mcg/min Intravenous New Bag/Given 04/10/16 1604)  etomidate (AMIDATE) injection (20 mg Intravenous Given 04/10/16 1555)  rocuronium (ZEMURON) injection (100 mg Intravenous Given 04/10/16 1555)  propofol (DIPRIVAN) 1000 MG/100ML infusion (30 mcg/kg/min  75.5 kg Intravenous New Bag/Given 04/10/16 1600)  propofol (DIPRIVAN) 1000 MG/100ML infusion (not administered)  heparin ADULT infusion 100 units/mL (25000 units/256mL sodium chloride 0.45%) (850 Units/hr Intravenous New Bag/Given 04/10/16 1613)  aspirin suppository 300 mg (not administered)  furosemide (LASIX) 10 MG/ML injection (not administered)  heparin bolus via infusion 4,000 Units (4,000 Units Intravenous Bolus from Bag 04/10/16 1613)  furosemide (LASIX) injection 40 mg (40 mg Intravenous  Given 04/10/16 1612)     Initial Impression / Assessment and Plan / ED Course  I have reviewed the triage vital signs and the nursing notes.  Pertinent labs & imaging results that were available during my care of the patient were reviewed by me and considered in my medical decision making (see chart for details).  Clinical Course   Patient is 55 yo F with PMH of diabetes and hypertension, who presented to ED as Code Stroke. I-stat troponin 0.15 but no ST elevations noted on initial EKG. Per Neurology, unremarkable neuro exam. Upon meeting patient after CT scan, patient noted to be in acute respiratory distress with O2 sat 30% NRB. Evaluated with attending physician, Dr. Eber Hong, who immediately intubated patient. Repeat EKG after intubation showed STEMI with inferior and lateral lead elevations. Dr. Hyacinth Meeker discussed case with cardiologist, Dr. Fredricka Bonine, who will take the patient to the cath lab immediately. Interventions included RSI intubation with etomidate and rocuronium, nitro drip, heparin drip, propofol drip, Foley catheter, and Lasix. Discussed plan of care with patient's coworker, who will notify patient's family.  CRITICAL CARE Performed by: Jari Pigg II  Total critical care time: > 35 minutes  Critical care time was exclusive of separately billable procedures and treating other patients.  Critical care was necessary to treat or prevent imminent or life-threatening deterioration.  Critical care was time spent personally by me on the following activities: development of treatment plan with patient and/or surrogate as well as nursing, discussions with consultants, evaluation of patient's response to treatment, examination of patient, obtaining history from patient or surrogate, ordering and performing treatments and interventions, ordering and review of laboratory studies, ordering and review of radiographic studies, pulse oximetry and re-evaluation of patient's  condition.   Final Clinical Impressions(s) / ED Diagnoses   Final diagnoses:  Acute pulmonary edema [facility])  Hypertensive emergency    New Prescriptions New Prescriptions   No medications on file     Jari Pigg II, [place] 04/10/16 1719    Eber Hong, MD 04/11/16 1549

## 2016-04-10 NOTE — Progress Notes (Signed)
Patient intubated due to respiratory distress by Dr. Hyacinth MeekerMiller, with a 7.5 ETT taped at 24 cm at the lip, good color change on the ETCO2 detector, good BBS ausculted over lung fields, SATS improving after intubation placed on above vent settings, MD aware.

## 2016-04-10 NOTE — Consult Note (Signed)
Requesting Physician: Dr. Hyacinth Meeker    Chief Complaint: Code stroke  History obtained from:  Patient     HPI:                                                                                                                                         Rebecca Wagner is an 55 y.o. 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 Patient was noted at 2:00 to suddenly have altered mental status and then dragging her left leg. EMS was called and patient was brought to [facility] as a code stroke. On arrival patient was awake and oriented, she did state that she did not take her hypertensive medications for the past 3 days. EMS noted that her blood pressure was 220/109. At that time patient had no shortness of breath, chest pain, numbness, tingling, weakness. Patient was slightly dysarthric at that time.  Patient was taken to CT scan which showed no acute abnormalities. After CT scan patient was noted to have shortness of breath and desired to be sitting up. Within 10 minutes patient became very diaphoretic, was having significant difficulty breathing.  Once pulse ox was placed on her she was noted to be satting in the 20s. Emergency MDs were immediately notified and at that time took over care of patient.   Date last known well: Date: 04/10/2016 Time last known well: Time: 14:00 tPA Given: No: minimal symptoms    Past Medical History:  Diagnosis Date  . Arthritis   . Depression   . Diabetes mellitus without complication (HCC)   . Hypertension     Past Surgical History:  Procedure Laterality Date  . CESAREAN SECTION    . LAPAROSCOPIC APPENDECTOMY N/A 02/29/2016   Procedure: LAPAROSCOPIC APPENDECTOMY;  Surgeon: Axel Filler, MD;  Location: [facility] OR;  Service: General;  Laterality: N/A;    Family History  Problem Relation Age of Onset  . Diabetes Mother   . Heart disease Father    Social History:  reports that she has quit smoking. She has never used smokeless tobacco. She reports that  she does not drink alcohol or use drugs.  Allergies: No Known Allergies  Medications:  Current Facility-Administered Medications  Medication Dose Route Frequency Provider Last Rate Last Dose  . propofol (DIPRIVAN) 1000 MG/100ML infusion           . etomidate (AMIDATE) injection   Intravenous PRN Eber Hong, MD   20 mg at 04/10/16 1555  . nitroGLYCERIN 0.2 mg/mL in dextrose 5 % infusion           . nitroGLYCERIN 50 mg in dextrose 5 % 250 mL (0.2 mg/mL) infusion  0-200 mcg/min Intravenous Titrated Daryl F de Villier II, PA      . propofol (DIPRIVAN) 1000 MG/100ML infusion  5-80 mcg/kg/min Intravenous Titrated Eber Hong, MD      . rocuronium Jettie Booze) injection   Intravenous PRN Eber Hong, MD   100 mg at 04/10/16 1555   Current Outpatient Prescriptions  Medication Sig Dispense Refill  . CALCIUM-MAGNESIUM-ZINC PO Take 1 tablet by mouth daily.    . fluconazole (DIFLUCAN) 100 MG tablet Take 1 tablet (100 mg total) by mouth daily. 5 tablet 0  . insulin aspart (NOVOLOG) 100 UNIT/ML injection Inject 5-10 Units into the skin 3 (three) times daily as needed for high blood sugar.     . Lactobacillus (ACIDOPHILUS PO) Take 1-2 tablets by mouth daily.    . Magnesium 250 MG TABS Take 1 tablet by mouth daily.    Marland Kitchen oxyCODONE (OXY IR/ROXICODONE) 5 MG immediate release tablet Take 1-2 tablets (5-10 mg total) by mouth every 6 (six) hours as needed for severe pain. 30 tablet 0  . oxyCODONE-acetaminophen (PERCOCET/ROXICET) 5-325 MG tablet Take 1 tablet by mouth every 6 (six) hours as needed for moderate pain or severe pain. 30 tablet 0     ROS:                                                                                                                                       History obtained from the patient  General ROS: negative for - chills, fatigue, fever, night sweats, weight  gain or weight loss Psychological ROS: negative for - behavioral disorder, hallucinations, memory difficulties, mood swings or suicidal ideation Ophthalmic ROS: negative for - blurry vision, double vision, eye pain or loss of vision ENT ROS: negative for - epistaxis, nasal discharge, oral lesions, sore throat, tinnitus or vertigo Allergy and Immunology ROS: negative for - hives or itchy/watery eyes Hematological and Lymphatic ROS: negative for - bleeding problems, bruising or swollen lymph nodes Endocrine ROS: negative for - galactorrhea, hair pattern changes, polydipsia/polyuria or temperature intolerance Respiratory ROS: negative for - cough, hemoptysis, shortness of breath or wheezing Cardiovascular ROS: negative for - chest pain, dyspnea on exertion, edema or irregular heartbeat Gastrointestinal ROS: negative for - abdominal pain, diarrhea, hematemesis, nausea/vomiting or stool incontinence Genito-Urinary ROS: negative for - dysuria, hematuria, incontinence or urinary frequency/urgency Musculoskeletal ROS: negative for - joint swelling or muscular weakness Neurological ROS: as noted in HPI Dermatological ROS: negative for  rash and skin lesion changes  Neurologic Examination:                                                                                                      Weight 75.5 kg (166 lb 7.2 oz).  HEENT-  Normocephalic, no lesions, without obvious abnormality.  Normal external eye and conjunctiva.  Normal TM's bilaterally.  Normal auditory canals and external ears. Normal external nose, mucus membranes and septum.  Normal pharynx. Cardiovascular- S1, S2 normal, pulses palpable throughout   Lungs- chest clear, no wheezing, rales, normal symmetric air entry Abdomen- normal findings: bowel sounds normal Extremities- no edema Lymph-no adenopathy palpable Musculoskeletal-no joint tenderness, deformity or swelling Skin-warm and dry, no hyperpigmentation, vitiligo, or suspicious  lesions  Neurological Examination Mental Status: Alert, oriented, thought content appropriate.  Speech fluent without evidence of aphasia.  Able to follow 3 step commands without difficulty. Cranial Nerves: II: Visual fields grossly normal, pupils equal, round, reactive to light and accommodation III,IV, VI: ptosis not present, extra-ocular motions intact bilaterally V,VII: smile symmetric, facial light touch sensation normal bilaterally VIII: hearing normal bilaterally IX,X: uvula rises symmetrically XI: bilateral shoulder shrug XII: midline tongue extension Motor: Right : Upper extremity   5/5    Left:     Upper extremity   5/5  Lower extremity   4/5     Lower extremity   4/5 Tone and bulk:normal tone throughout; no atrophy noted Sensory: Pinprick and light touch intact throughout, bilaterally Deep Tendon Reflexes: 1+ and symmetric throughout Plantars: Right: downgoing   Left: downgoing Cerebellar: normal finger-to-nose, heel to shin was not obtained secondary to emergent shortness of breath. Gait: Not tested       Lab Results: Basic Metabolic Panel:  Recent Labs Lab 04/10/16 1534  NA 137  K 3.5  CL 103  GLUCOSE 222*  BUN 22*  CREATININE 0.60    Liver Function Tests: No results for input(s): AST, ALT, ALKPHOS, BILITOT, PROT, ALBUMIN in the last 168 hours. No results for input(s): LIPASE, AMYLASE in the last 168 hours. No results for input(s): AMMONIA in the last 168 hours.  CBC:  Recent Labs Lab 04/10/16 1525 04/10/16 1534  WBC 7.7  --   NEUTROABS 4.5  --   HGB 11.9* 12.9  HCT 36.0 38.0  MCV 86.3  --   PLT 446*  --     Cardiac Enzymes: No results for input(s): CKTOTAL, CKMB, CKMBINDEX, TROPONINI in the last 168 hours.  Lipid Panel: No results for input(s): CHOL, TRIG, HDL, CHOLHDL, VLDL, LDLCALC in the last 168 hours.  CBG:  Recent Labs Lab 04/10/16 1548  GLUCAP 251*    Microbiology: Results for orders placed or performed during the  hospital encounter of 02/29/16  Surgical pcr screen     Status: None   Collection Time: 02/29/16  5:37 AM  Result Value Ref Range Status   MRSA, PCR NEGATIVE NEGATIVE Final   Staphylococcus aureus NEGATIVE NEGATIVE Final    Comment:        The Xpert SA Assay (FDA approved for NASAL specimens in  patients over 70 years of age), is one component of a comprehensive surveillance program.  Test performance has been validated by [facility] for patients greater than or equal to 5 year old. It is not intended to diagnose infection nor to guide or monitor treatment.     Coagulation Studies: No results for input(s): LABPROT, INR in the last 72 hours.  Imaging: Ct Head Code Stroke W/o Cm  Result Date: 04/10/2016 CLINICAL DATA:  Code stroke. Code stroke. Confusion. Left leg weakness. Diabetes. EXAM: CT HEAD WITHOUT CONTRAST TECHNIQUE: Contiguous axial images were obtained from the base of the skull through the vertex without intravenous contrast. COMPARISON:  None. FINDINGS: Brain: There are advanced chronic small vessel ischemic changes throughout the brain. No identifiable acute infarction, mass lesion, hemorrhage, hydrocephalus or extra-axial collection. Vascular: There is atherosclerotic calcification of the major vessels at the base of the brain. Skull: Normal Sinuses/Orbits: Clear/normal Other: None significant ASPECTS (Alberta Stroke Program Early CT Score) - Ganglionic level infarction (caudate, lentiform nuclei, internal capsule, insula, M1-M3 cortex): 7 - Supraganglionic infarction (M4-M6 cortex): 3 Total score (0-10 with 10 being normal): 10 IMPRESSION: 1. Advanced chronic small vessel ischemic changes throughout the brain. No sign of acute cortical or large vessel infarction. There is atherosclerotic calcification of the major vessels at the base of the brain. 2. ASPECTS is 10. These results were called by telephone at the time of interpretation on 04/10/2016 at 3:47 pm to Dr.  Amada Jupiter, who verbally acknowledged these results. Electronically Signed   By: Paulina Fusi M.D.   On: 04/10/2016 15:48       Assessment and plan discussed with with attending physician and they are in agreement.    Felicie Morn PA-C [facility] [phone]  04/10/2016, 3:55 PM   Assessment: 55 y.o. female who complained of dizziness and stumbled at work. There was question of dragging the leg, but the patient denies this. After being brought to the ER, she was found to be severely hypoxic. She was completely nonfocal on examination after arrival.  It is not at all clear to me that she has had any neurological event to precipitate this. She did forget to take her blood pressure medicines, and it is possible that this precipitated flash pulmonary edema causing her symptoms.  One other possibility would be that she had a small ischemic stroke which caused her hypertension and then led to the other symptoms, but I have nothing on history or exam to make me strongly suspect that at this time.  Stroke Risk Factors - diabetes mellitus and hypertension  1) address her flash pulmonary edema, possible STEMI 2) okay to lower blood pressure given that it is likely causative. 3) MRI brain once patient is stable.   Ritta Slot, MD [facility] [phone]  If 7pm- 7am, please page neurology on call as listed in AMION.

## 2016-04-10 NOTE — ED Notes (Signed)
Pt taken to cath lab for emergent cath. PT belongings were left with a RN friend Pam. Pt gave verbal consent prior to sedation meds for her friend to have belonging. Purse given to friend, wig, and also bank envelope that was found in shirt.

## 2016-04-10 NOTE — Progress Notes (Signed)
Personal belongings: Two silver colored bracelets, two rings, one gold color with clear jewels, one silver color with clear jewels, one pair blue glasses, $8.60 in cash.

## 2016-04-10 NOTE — ED Notes (Signed)
Continuing to Bag pt with sats in 70-80's. Repeat EKG done and [facility] noted.

## 2016-04-10 NOTE — ED Notes (Signed)
[facility] contacted to call Code Stemi 

## 2016-04-10 NOTE — ED Notes (Signed)
Pt arrivaled at 1526 via [facility] from work after being found having slurred speech and AMS as a code stroke. LSN 1415. PT cleared by EPD before cT, Labs drawn and taken directly to CT. PT alert and ox4 on arrival. MD kirkpatrick met in CT 2.

## 2016-04-11 ENCOUNTER — Inpatient Hospital Stay (HOSPITAL_COMMUNITY): Payer: 59

## 2016-04-11 ENCOUNTER — Encounter (HOSPITAL_COMMUNITY): Payer: Self-pay | Admitting: Cardiovascular Disease

## 2016-04-11 DIAGNOSIS — J9601 Acute respiratory failure with hypoxia: Secondary | ICD-10-CM | POA: Clinically undetermined

## 2016-04-11 DIAGNOSIS — J810 Acute pulmonary edema: Secondary | ICD-10-CM

## 2016-04-11 DIAGNOSIS — R9431 Abnormal electrocardiogram [ECG] [EKG]: Secondary | ICD-10-CM | POA: Diagnosis present

## 2016-04-11 DIAGNOSIS — I6340 Cerebral infarction due to embolism of unspecified cerebral artery: Secondary | ICD-10-CM

## 2016-04-11 DIAGNOSIS — I2510 Atherosclerotic heart disease of native coronary artery without angina pectoris: Secondary | ICD-10-CM

## 2016-04-11 DIAGNOSIS — Z794 Long term (current) use of insulin: Secondary | ICD-10-CM

## 2016-04-11 DIAGNOSIS — I161 Hypertensive emergency: Secondary | ICD-10-CM

## 2016-04-11 DIAGNOSIS — E785 Hyperlipidemia, unspecified: Secondary | ICD-10-CM | POA: Insufficient documentation

## 2016-04-11 DIAGNOSIS — I63512 Cerebral infarction due to unspecified occlusion or stenosis of left middle cerebral artery: Principal | ICD-10-CM

## 2016-04-11 DIAGNOSIS — I2119 ST elevation (STEMI) myocardial infarction involving other coronary artery of inferior wall: Secondary | ICD-10-CM

## 2016-04-11 DIAGNOSIS — E119 Type 2 diabetes mellitus without complications: Secondary | ICD-10-CM

## 2016-04-11 LAB — CBC WITH DIFFERENTIAL/PLATELET
BASOS PCT: 0 %
Basophils Absolute: 0 10*3/uL (ref 0.0–0.1)
EOS ABS: 0.1 10*3/uL (ref 0.0–0.7)
EOS PCT: 0 %
HCT: 36.1 % (ref 36.0–46.0)
Hemoglobin: 11.8 g/dL — ABNORMAL LOW (ref 12.0–15.0)
LYMPHS ABS: 1.5 10*3/uL (ref 0.7–4.0)
Lymphocytes Relative: 11 %
MCH: 28.5 pg (ref 26.0–34.0)
MCHC: 32.7 g/dL (ref 30.0–36.0)
MCV: 87.2 fL (ref 78.0–100.0)
Monocytes Absolute: 1.3 10*3/uL — ABNORMAL HIGH (ref 0.1–1.0)
Monocytes Relative: 9 %
NEUTROS PCT: 80 %
Neutro Abs: 11 10*3/uL — ABNORMAL HIGH (ref 1.7–7.7)
PLATELETS: 425 10*3/uL — AB (ref 150–400)
RBC: 4.14 MIL/uL (ref 3.87–5.11)
RDW: 15.3 % (ref 11.5–15.5)
WBC: 13.9 10*3/uL — AB (ref 4.0–10.5)

## 2016-04-11 LAB — LIPID PANEL
CHOL/HDL RATIO: 4 ratio
CHOLESTEROL: 257 mg/dL — AB (ref 0–200)
HDL: 65 mg/dL (ref >40)
LDL Cholesterol: 165 mg/dL — ABNORMAL HIGH (ref 0–99)
TRIGLYCERIDES: 134 mg/dL (ref <150)
VLDL: 27 mg/dL (ref 0–40)

## 2016-04-11 LAB — GLUCOSE, CAPILLARY
GLUCOSE-CAPILLARY: 188 mg/dL — AB (ref 65–99)
GLUCOSE-CAPILLARY: 136 mg/dL — AB (ref 65–99)
GLUCOSE-CAPILLARY: 126 mg/dL — AB (ref 65–99)
GLUCOSE-CAPILLARY: 117 mg/dL — AB (ref 65–99)
GLUCOSE-CAPILLARY: 165 mg/dL — AB (ref 65–99)
GLUCOSE-CAPILLARY: 209 mg/dL — AB (ref 65–99)
Glucose-Capillary: 104 mg/dL — ABNORMAL HIGH (ref 65–99)
Glucose-Capillary: 107 mg/dL — ABNORMAL HIGH (ref 65–99)
Glucose-Capillary: 128 mg/dL — ABNORMAL HIGH (ref 65–99)
Glucose-Capillary: 130 mg/dL — ABNORMAL HIGH (ref 65–99)
Glucose-Capillary: 154 mg/dL — ABNORMAL HIGH (ref 65–99)
Glucose-Capillary: 184 mg/dL — ABNORMAL HIGH (ref 65–99)
Glucose-Capillary: 243 mg/dL — ABNORMAL HIGH (ref 65–99)
Glucose-Capillary: 309 mg/dL — ABNORMAL HIGH (ref 65–99)

## 2016-04-11 LAB — BASIC METABOLIC PANEL
Anion gap: 8 (ref 5–15)
BUN: 18 mg/dL (ref 6–20)
CO2: 26 mmol/L (ref 22–32)
CREATININE: 0.74 mg/dL (ref 0.44–1.00)
Calcium: 9.1 mg/dL (ref 8.9–10.3)
Chloride: 105 mmol/L (ref 101–111)
Glucose, Bld: 91 mg/dL (ref 65–99)
POTASSIUM: 3.4 mmol/L — AB (ref 3.5–5.1)
SODIUM: 139 mmol/L (ref 135–145)

## 2016-04-11 LAB — TROPONIN I
TROPONIN I: 0.16 ng/mL — AB (ref <0.03)
Troponin I: 0.17 ng/mL (ref <0.03)

## 2016-04-11 LAB — TSH: TSH: 0.63 u[IU]/mL (ref 0.350–4.500)

## 2016-04-11 LAB — VITAMIN B12: VITAMIN B 12: 502 pg/mL (ref 180–914)

## 2016-04-11 MED ORDER — DEXTROSE 10 % IV SOLN: INTRAVENOUS | Status: DC | PRN

## 2016-04-11 MED ORDER — INSULIN ASPART 100 UNIT/ML ~~LOC~~ SOLN
0.0000 [IU] | Freq: Every day | SUBCUTANEOUS | Status: DC
  Administered 2016-04-11: 2 [IU] via SUBCUTANEOUS

## 2016-04-11 MED ORDER — CLOPIDOGREL BISULFATE 75 MG PO TABS
75.0000 mg | ORAL_TABLET | Freq: Every day | ORAL | Status: DC
  Administered 2016-04-11 – 2016-04-15 (×5): 75 mg via ORAL
  Filled 2016-04-11 (×5): qty 1

## 2016-04-11 MED ORDER — POTASSIUM CHLORIDE 20 MEQ/15ML (10%) PO SOLN
40.0000 meq | Freq: Once | ORAL | Status: AC
  Administered 2016-04-11: 40 meq
  Filled 2016-04-11: qty 30

## 2016-04-11 MED ORDER — INSULIN ASPART 100 UNIT/ML ~~LOC~~ SOLN
1.0000 [IU] | SUBCUTANEOUS | Status: DC
  Administered 2016-04-11: 1 [IU] via SUBCUTANEOUS

## 2016-04-11 MED ORDER — ACETAMINOPHEN 325 MG PO TABS: 650.0000 mg | ORAL_TABLET | Freq: Four times a day (QID) | ORAL | Status: DC | PRN

## 2016-04-11 MED ORDER — INSULIN GLARGINE 100 UNIT/ML ~~LOC~~ SOLN
10.0000 [IU] | SUBCUTANEOUS | Status: DC
  Administered 2016-04-11: 10 [IU] via SUBCUTANEOUS
  Filled 2016-04-11: qty 0.1

## 2016-04-11 MED ORDER — IOPAMIDOL (ISOVUE-370) INJECTION 76%
INTRAVENOUS | Status: AC
  Administered 2016-04-11: 50 mL
  Filled 2016-04-11: qty 50

## 2016-04-11 MED ORDER — ACETAMINOPHEN 650 MG RE SUPP
650.0000 mg | RECTAL | Status: DC | PRN
  Administered 2016-04-11: 650 mg via RECTAL
  Filled 2016-04-11: qty 1

## 2016-04-11 MED ORDER — INSULIN ASPART 100 UNIT/ML ~~LOC~~ SOLN
0.0000 [IU] | Freq: Three times a day (TID) | SUBCUTANEOUS | Status: DC
  Administered 2016-04-12 – 2016-04-13 (×6): 3 [IU] via SUBCUTANEOUS
  Administered 2016-04-14: 5 [IU] via SUBCUTANEOUS
  Administered 2016-04-14 – 2016-04-15 (×4): 3 [IU] via SUBCUTANEOUS

## 2016-04-11 MED ORDER — INSULIN ASPART 100 UNIT/ML ~~LOC~~ SOLN
2.0000 [IU] | SUBCUTANEOUS | Status: DC
  Administered 2016-04-11: 4 [IU] via SUBCUTANEOUS
  Administered 2016-04-11: 2 [IU] via SUBCUTANEOUS

## 2016-04-11 MED FILL — Heparin Sodium (Porcine) Inj 1000 Unit/ML: INTRAMUSCULAR | Qty: 10 | Status: AC

## 2016-04-11 NOTE — Progress Notes (Signed)
RT note: Late entry. Patient instructed with use of incentive spirometer. With good effort she had 3  Pulled between 750 and 1000.

## 2016-04-11 NOTE — Progress Notes (Addendum)
STROKE TEAM PROGRESS NOTE   HISTORY OF PRESENT ILLNESS (per record) Rebecca Wagner is an 55 y.o. 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 Patient was noted at 14:00 04/10/2016 ([facility]KW) to suddenly have altered mental status and then dragging her left leg. EMS was called and patient was brought to [facility] as a code stroke. On arrival patient was awake and oriented, she did state that she did not take her hypertensive medications for the past 3 days. EMS noted that her blood pressure was 220/109. At that time patient had no shortness of breath, chest pain, numbness, tingling, weakness. Patient was slightly dysarthric at that time.  Patient was taken to CT scan which showed no acute abnormalities. After CT scan patient was noted to have shortness of breath and desired to be sitting up. Within 10 minutes patient became very diaphoretic, was having significant difficulty breathing.  Once pulse ox was placed on her she was noted to be satting in the 20s. Emergency MDs were immediately notified and at that time took over care of patient. Patient was not administered IV t-PA secondary to minimal symptoms . She was admitted for further evaluation and treatment.   SUBJECTIVE (INTERVAL HISTORY) Pt family members are at bedside. I also talked with pt daughter Rebecca Wagner. Pt has been extubated and tolerating well. Rebecca Wagner any CP, HA, or neuro deficit. She Rebecca Wagner any left leg dragging during the event and she stated that her shoe on the left foot is too big so she has to drag due to that reason instead of weakness.   OBJECTIVE Temp:  [97.5 F (36.4 C)-99.9 F (37.7 C)] 99.7 F (37.6 C) (10/12 0800) Pulse Rate:  [0-140] 91 (10/12 0930) Cardiac Rhythm: Normal sinus rhythm (10/12 0500) Resp:  [13-137] 19 (10/12 0930) BP: (102-202)/(68-135) 146/82 (10/12 0930) SpO2:  [0 %-100 %] 100 % (10/12 0930) FiO2 (%):  [40 %-100 %] 40 % (10/12 0830) Weight:  [71.7 kg (158 lb 1.1 oz)-75.5 kg  (166 lb 7.2 oz)] 71.7 kg (158 lb 1.1 oz) (10/12 0451)  CBC:  Recent Labs Lab 04/10/16 1525 04/10/16 1534 04/11/16 0610  WBC 7.7  --  13.9*  NEUTROABS 4.5  --  11.0*  HGB 11.9* 12.9 11.8*  HCT 36.0 38.0 36.1  MCV 86.3  --  87.2  PLT 446*  --  425*    Basic Metabolic Panel:  Recent Labs Lab 04/10/16 1525 04/10/16 1534 04/11/16 0610  NA 135 137 139  K 3.5 3.5 3.4*  CL 103 103 105  CO2 20*  --  26  GLUCOSE 220* 222* 91  BUN 20 22* 18  CREATININE 0.73 0.60 0.74  CALCIUM 9.3  --  9.1    Lipid Panel:    Component Value Date/Time   TRIG 203 (H) 04/10/2016 1557   HgbA1c:  Lab Results  Component Value Date   HGBA1C 14.1 (H) 02/29/2016   Urine Drug Screen:    Component Value Date/Time   LABOPIA NONE DETECTED 04/10/2016 1824   COCAINSCRNUR NONE DETECTED 04/10/2016 1824   LABBENZ NONE DETECTED 04/10/2016 1824   AMPHETMU NONE DETECTED 04/10/2016 1824   THCU POSITIVE (A) 04/10/2016 1824   LABBARB NONE DETECTED 04/10/2016 1824      IMAGING I have personally reviewed the radiological images below and agree with the radiology interpretations.  Mr Brain Wo Contrast 04/11/2016 1. Focus of diffusion restriction in subcortical white matter at the left frontal parietal junction consistent with acute/early subacute  infarct. 2. Focus of diffusion restriction within the left splenium of corpus callosum with different ADC value from the left frontal parietal lesion. This is probably an additional infarct but somewhat atypical. Follow-up MRI of the brain is recommended to ensure resolution of diffusion abnormality and exclude the possibility of underlying neoplasm. 3. Background of advanced chronic microvascular ischemic changes and mild parenchymal volume loss.  Dg Chest Port 1 View 04/11/2016 Stable support apparatus.  Improved bilateral pulmonary edema.  04/10/2016 Endotracheal tube and nasogastric catheter in satisfactory position. Diffuse increased density throughout both  lungs likely related to underlying pulmonary edema.   Ct Head Code Stroke W/o Cm  04/10/2016 1. Advanced chronic small vessel ischemic changes throughout the brain. No sign of acute cortical or large vessel infarction. There is atherosclerotic calcification of the major vessels at the base of the brain. 2. ASPECTS is 10.   Ct Angio Head and neck W Or Wo Contrast 04/11/2016 IMPRESSION: Mild atherosclerotic disease of the carotid bifurcation bilaterally without significant carotid stenosis. 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 Moderate to severe stenosis distal left vertebral artery and moderate stenosis distal right vertebral artery. Severe stenosis proximal left posterior cerebral artery with diffuse disease in the posterior cerebral artery bilaterally. Chronic microvascular ischemic change in the white matter, advanced for age Small bilateral effusions with bibasilar atelectasis.   Cardiac cath  1. Severe, diffuse, heavily calcified 3 vessel disease in a typical diabetic pattern affecting branches and distal vessels. No clear culprit for unstable angina or myocardial infarction. 2. Mild to moderately decreased LV systolic function with an ejection fraction of 40% with akinesis of the apical segments. 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 However, LAD ischemia cannot be excluded given that the LAD wraps around the apex. 3. Mildly elevated left ventricular end-diastolic pressure.   PHYSICAL EXAM  Temp:  [97.7 F (36.5 C)-100.4 F (38 C)] 99.9 F (37.7 C) (10/12 1745) Pulse Rate:  [77-102] 97 (10/12 1800) Resp:  [17-43] 17 (10/12 1800) BP: (102-184)/(68-97) 153/83 (10/12 1800) SpO2:  [96 %-100 %] 100 % (10/12 1800) FiO2 (%):  [40 %-70 %] 40 % (10/12 1120) Weight:  [158 lb 1.1 oz (71.7 kg)] 158 lb 1.1 oz (71.7 kg) (10/12 0451)  General - Well nourished, well developed, in no apparent distress.  Ophthalmologic - Fundi not visualized due  to eye movement.  Cardiovascular - Regular rate and rhythm, however tachycardia.  Mental Status -  Level of arousal and orientation to time, place, and person were intact. Language including expression, naming, repetition, comprehension was assessed and found intact.  Cranial Nerves II - XII - II - Visual field intact OU. III, IV, VI - Extraocular movements intact. V - Facial sensation intact bilaterally. VII - Facial movement intact bilaterally. VIII - Hearing & vestibular intact bilaterally. X - Palate elevates symmetrically. XI - Chin turning & shoulder shrug intact bilaterally. XII - Tongue protrusion intact.  Motor Strength - The patient's strength was normal in all extremities and pronator drift was absent.  Bulk was normal and fasciculations were absent.   Motor Tone - Muscle tone was assessed at the neck and appendages and was normal.  Reflexes - The patient's reflexes were 1+ in all extremities and she had no pathological reflexes.  Sensory - Light touch, temperature/pinprick were assessed and were symmetrical.    Coordination - The patient had normal movements in the hands with no ataxia or dysmetria.  Tremor was absent.  Gait and Station - not  tested due to safety concerns.   ASSESSMENT/PLAN Rebecca Wagner is a 55 y.o. female with history of DM, HTN, depression and arthritis presenting with Altered mental status and dragging her left leg in setting of hypertensive emergency. Developed shortness of breath and diaphoresis in the emergency room. She did not receive IV t-PA due to minimal symptoms.   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 It is most likely due to ? STEMI vs. hypoperfusion and hypoxia in the setting of severe intracranial stenosis vs. cardiac cath procedure related.  MRI  left frontal parietal infarct and separate left splenium of corpus callosum infarct  CTA H&N  severe  multifocal intracranial stenosis including bilateral PCAs, VAs, cavernous ICAs.   Cardiac cath EF 40%, and severe multivessel stenosis  LDL165   HgbA1c  pending, 14.1 in Aug  Lovenox 40 mg sq daily for VTE prophylaxis  Diet NPO time specified  No antithrombotic prior to admission, now on aspirin 81 mg daily. Due to severe intracranial stenosis and multivessel CAD, recommend DAPT with aspirin and Plavix for stroke and cardiac prevention.  Patient counseled to be compliant with her antithrombotic medications  Ongoing aggressive stroke risk factor management  Therapy recommendations:  pending   Disposition:  pending   Possible Acute STEMI   Shows multivessel coronary artery disease but no culprit lesion.  Unclear if true M versus diffuse demand ischemia in setting of hypertensive emergency  EKG negative  Medical management As per cardiology  Acute pulmonary edema  Acute Respiratory failure   secondary to hypertensive emergency  Intubated in the ER, now extubated    CCM on board  Hypertensive emergency   Blood pressure  220/109 in setting of neurologic symptoms   Placed on nitroglycerin drip, also during the night, now off   Blood pressure is now 150-170 From stroke standpoint, permissive hypertension (OK if 180/105 ) but gradually normalize in 5-7 days Long-term BP goal normotensive  Hyperlipidemia  Home meds:  No statin  LDL 165 , goal < 70  Now on Lipitor 80 mg daily   Continue statin at discharge  Diabetes type II  HgbA1c  pending, 14.1 in Aug, goal < 7.0  Uncontrolled   on insulin drip, now off  Need DM education  Close follow-up with PCP for better DM control  Other Stroke Risk Factors  THC positive on UDS this admission  Coronary artery disease, ?acute stemi. Has multi vessel cardiac disease  Other Active Problems  Depression  Hospital day # 1  Neurology will sign off. Please call with questions. Pt will follow up with Dr. Roda Shutters at  [facility] in about 6 weeks. Thanks for the consult.  Marvel Plan, MD PhD Stroke Neurology 04/11/2016 7:42 PM    To contact Stroke Continuity provider, please refer to [email or web address]. After hours, contact General Neurology

## 2016-04-11 NOTE — Progress Notes (Signed)
Received call from radiology that MRI brain is ready for MD to view, paged MD Eldridge DaceVaranasi to inform.

## 2016-04-11 NOTE — Evaluation (Signed)
Clinical/Bedside Swallow Evaluation Patient Details  Name: Rebecca Wagner [place] MRN: [id number] Date of Birth: 1961-05-03  Today'Wagner Date: 04/11/2016 Time: SLP Start Time (ACUTE ONLY): 1500 SLP Stop Time (ACUTE ONLY): 1525 SLP Time Calculation (min) (ACUTE ONLY): 25 min  Past Medical History:  Past Medical History:  Diagnosis Date  . Arthritis   . Depression   . Diabetes mellitus without complication (HCC)   . Hypertension    Past Surgical History:  Past Surgical History:  Procedure Laterality Date  . CARDIAC CATHETERIZATION N/A 04/10/2016   Procedure: Left Heart Cath and Coronary Angiography;  Surgeon: Iran OuchMuhammad A Arida, MD;  Location: [facility] INVASIVE CV LAB;  Service: Cardiovascular;  Laterality: N/A;  . CESAREAN SECTION    . LAPAROSCOPIC APPENDECTOMY N/A 02/29/2016   Procedure: LAPAROSCOPIC APPENDECTOMY;  Surgeon: Axel FillerArmando Ramirez, MD;  Location: [facility] OR;  Service: General;  Laterality: N/A;   HPI:  55 year old female with hypoxemic respiratory failure due to flash pulmonary edema secondary to ? CVA (admitted with left sided weakness and AMS) and STEMI with poor CAD. Intubated 10/11-10/12. MRI: Focus of diffusion restriction in subcortical white matter at the   Assessment / Plan / Recommendation Clinical Impression  Patient presents with a functional oropharyngeal swallow. Intermittent baseline throat clearing and hoarse vocal quality noted due to recent extubation however oral transit of bolus and swallow initiation appear timely and laryngeal movement appearing strong based on palpation. Recommend initiation of diet with caution given recent intubation. No neuro deficits noted however will f/u briefly for tolerance given recent extubation.     Aspiration Risk  Mild aspiration risk    Diet Recommendation Regular;Thin liquid   Liquid Administration via: Cup;No straw Medication Administration: Whole meds with liquid Supervision: Patient able to self feed;Full supervision/cueing for  compensatory strategies Compensations: Slow rate;Small sips/bites Postural Changes: Seated upright at 90 degrees    Other  Recommendations Oral Care Recommendations: Oral care BID   Follow up Recommendations None      Frequency and Duration min 1 x/week  1 week       Prognosis Prognosis for Safe Diet Advancement: Good      Swallow Study   General HPI: 55 year old female with hypoxemic respiratory failure due to flash pulmonary edema secondary to ? CVA (admitted with left sided weakness and AMS) and STEMI with poor CAD. Intubated 10/11-10/12. MRI: Focus of diffusion restriction in subcortical white matter at the Type of Study: Bedside Swallow Evaluation Previous Swallow Assessment: none Diet Prior to this Study: NPO Temperature Spikes Noted: No Respiratory Status: Nasal cannula History of Recent Intubation: Yes Length of Intubations (days): 1 days Date extubated: 04/11/16 Behavior/Cognition: Alert;Cooperative;Pleasant mood Oral Cavity Assessment: Within Functional Limits Oral Care Completed by SLP: Recent completion by staff Oral Cavity - Dentition: Adequate natural dentition Vision: Functional for self-feeding Self-Feeding Abilities: Able to feed self Patient Positioning: Upright in bed Baseline Vocal Quality: Hoarse Volitional Cough: Strong Volitional Swallow: Able to elicit    Oral/Motor/Sensory Function Overall Oral Motor/Sensory Function: Within functional limits   Ice Chips Ice chips: Not tested   Thin Liquid Thin Liquid: Within functional limits Presentation: Cup;Self Fed    Nectar Thick Nectar Thick Liquid: Not tested   Honey Thick Honey Thick Liquid: Not tested   Puree Puree: Within functional limits Presentation: Self Fed;Spoon   Solid   GO  [name] MA, CCC-SLP [phone]  Solid: Within functional limits Presentation: Self Fed        [name] 04/11/2016,3:50 PM

## 2016-04-11 NOTE — Progress Notes (Signed)
PULMONARY / CRITICAL CARE MEDICINE   Name: Rebecca Wagner MRN: [id number] DOB: 24-Apr-1961    ADMISSION DATE:  04/10/2016 CONSULTATION DATE:  04/10/2016  REFERRING MD:  Dr. Kirke Corin  CHIEF COMPLAINT:  AMS  HISTORY OF PRESENT ILLNESS:  Patient is encephalopathic and/or intubated. Therefore history has been obtained from chart review. 55 year old female with PMH as below, which is significant for HTN and DM. 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 While at work 10/11 around 1400 she was noted to be dragging her left foot with AMS. She was transported to [facility] ED as a code stroke and underwent CT scan demonstrating no hemorrhage or clear evidence of acute infarct. Shortly after returning from CT she developed shortness of breath and desaturation and was intubated. ST changes noted during this episode and she was taken to cath lab where diffuse CAD was discovered and no intervention was done. After the cath she was sent to ICU for recovery and further evaluation. PCCM to see.   PAST MEDICAL HISTORY :  She  has a past medical history of Arthritis; Depression; Diabetes mellitus without complication (HCC); and Hypertension.  PAST SURGICAL HISTORY: She  has a past surgical history that includes Cesarean section; laparoscopic appendectomy (N/A, 02/29/2016); and Cardiac catheterization (N/A, 04/10/2016).  No Known Allergies  No current facility-administered medications on file prior to encounter.    Current Outpatient Prescriptions on File Prior to Encounter  Medication Sig  . CALCIUM-MAGNESIUM-ZINC PO Take 1 tablet by mouth daily.  . fluconazole (DIFLUCAN) 100 MG tablet Take 1 tablet (100 mg total) by mouth daily. (Patient not taking: Reported on 04/11/2016)  . insulin aspart (NOVOLOG) 100 UNIT/ML injection Inject 5-10 Units into the skin 3 (three) times daily as needed for high blood sugar.   . Lactobacillus (ACIDOPHILUS PO) Take 1-2 tablets by mouth daily.  . Magnesium 250 MG TABS  Take 250 mg by mouth daily.   Marland Kitchen oxyCODONE (OXY IR/ROXICODONE) 5 MG immediate release tablet Take 1-2 tablets (5-10 mg total) by mouth every 6 (six) hours as needed for severe pain. (Patient not taking: Reported on 04/11/2016)  . oxyCODONE-acetaminophen (PERCOCET/ROXICET) 5-325 MG tablet Take 1 tablet by mouth every 6 (six) hours as needed for moderate pain or severe pain. (Patient not taking: Reported on 04/11/2016)    FAMILY HISTORY:  Her indicated that her mother is deceased. She indicated that her father is deceased.    SOCIAL HISTORY: She  reports that she has quit smoking. She has never used smokeless tobacco. She reports that she does not drink alcohol or use drugs.  REVIEW OF SYSTEMS:   unable  SUBJECTIVE:    VITAL SIGNS: BP (!) 141/78   Pulse 85   Temp 99.7 F (37.6 C) (Core (Comment))   Resp 19   Ht 5\' 7"  (1.702 m)   Wt 71.7 kg (158 lb 1.1 oz)   SpO2 100%   BMI 24.76 kg/m   HEMODYNAMICS:    VENTILATOR SETTINGS: Vent Mode: PRVC FiO2 (%):  [40 %-100 %] 40 % Set Rate:  [16 bmp-22 bmp] 16 bmp Vt Set:  [500 mL-550 mL] 500 mL PEEP:  [10 cmH20-15 cmH20] 10 cmH20 Plateau Pressure:  [15 cmH20-31 cmH20] 18 cmH20  INTAKE / OUTPUT: I/O last 3 completed shifts: In: 1089.1 [I.V.:979.1; NG/GT:60; IV Piggyback:50] Out: 1810 [Urine:1810]  PHYSICAL EXAMINATION: General:  Female of normal body habitus in NAD Neuro:  Awake on vent, follows commands.  HEENT:  [employer]/AT. PERRL, mild JVD Cardiovascular:  RRR, no MRG Lungs:  Coarse Abdomen:  Soft, non-distended Musculoskeletal:  No acute deformity or ROM limitation.  Skin:  Grossly intact  LABS:  BMET  Recent Labs Lab 04/10/16 1525 04/10/16 1534 04/11/16 0610  NA 135 137 139  K 3.5 3.5 3.4*  CL 103 103 105  CO2 20*  --  26  BUN 20 22* 18  CREATININE 0.73 0.60 0.74  GLUCOSE 220* 222* 91    Electrolytes  Recent Labs Lab 04/10/16 1525 04/11/16 0610  CALCIUM 9.3 9.1    CBC  Recent Labs Lab 04/10/16 1525  04/10/16 1534 04/11/16 0610  WBC 7.7  --  13.9*  HGB 11.9* 12.9 11.8*  HCT 36.0 38.0 36.1  PLT 446*  --  425*    Coag's  Recent Labs Lab 04/10/16 1525  APTT 29  INR 0.99    Sepsis Markers No results for input(s): LATICACIDVEN, PROCALCITON, O2SATVEN in the last 168 hours.  ABG  Recent Labs Lab 04/10/16 1740  PHART 7.376  PCO2ART 41.4  PO2ART 183.0*    Liver Enzymes  Recent Labs Lab 04/10/16 1525  AST 19  ALT 13*  ALKPHOS 88  BILITOT 0.6  ALBUMIN 3.3*    Cardiac Enzymes  Recent Labs Lab 04/11/16 0820  TROPONINI 0.17*    Glucose  Recent Labs Lab 04/11/16 0454 04/11/16 0556 04/11/16 0652 04/11/16 0740 04/11/16 0847 04/11/16 0952  GLUCAP 126* 107* 104* 128* 117* 184*    Imaging Mr Brain Wo Contrast  Result Date: 04/11/2016 CLINICAL DATA:  55 y/o F; left leg weakness and altered mental status follow by shortness of breath requiring intubation. EXAM: MRI HEAD WITHOUT CONTRAST TECHNIQUE: Multiplanar, multiecho pulse sequences of the brain and surrounding structures were obtained without intravenous contrast. COMPARISON:  04/10/2016 CT head FINDINGS: Brain: Focus of diffusion restriction within subcortical white matter in the left frontoparietal junction and small punctate focus more posteriorly and parietal white matter. Additional punctate focus of diffusion restriction with mildly decreased ADC in the left forceps of the splenium of corpus callosum, slight mass effect, and intermediate T2 signal centrally or ADC is lowest. There is a background of extensive T2 FLAIR hyperintense signal abnormality throughout subcortical and periventricular white matter of the brain and within the pons compatible with advanced chronic microvascular ischemic changes. No abnormal susceptibility hypointensity to indicate intracranial hemorrhage. No focal mass effect. No extra-axial collection. No hydrocephalus. Mild parenchymal volume loss. Vascular: Normal flow voids. Skull  and upper cervical spine: Normal marrow signal. Sinuses/Orbits: Mucosal thickening and fluid levels within the nasal passages and nasopharynx is likely due to intubation. No abnormal signal of mastoid air cells. T2 hyperintense lesion anterior to the left ear within the subcutaneous fat probably represents a dermal appendage cyst measuring up to 14 mm. Other: None. IMPRESSION: 1. Focus of diffusion restriction in subcortical white matter at the left frontal parietal junction consistent with acute/early subacute infarct. 2. Focus of diffusion restriction within the left splenium of corpus callosum with different ADC value from the left frontal parietal lesion. This is probably an additional infarct but somewhat atypical. Follow-up MRI of the brain is recommended to ensure resolution of diffusion abnormality and exclude the possibility of underlying neoplasm. 3. Background of advanced chronic microvascular ischemic changes and mild parenchymal volume loss. These results will be called to the ordering clinician or representative by the Radiologist Assistant, and communication documented in the PACS or zVision Dashboard. Electronically Signed   By: Mitzi HansenLance  Furusawa-Stratton M.D.   On: 04/11/2016 05:04  Dg Chest Port 1 View  Result Date: 04/11/2016 CLINICAL DATA:  Acute respiratory failure. EXAM: PORTABLE CHEST 1 VIEW COMPARISON:  Radiograph of April 10, 2016. FINDINGS: Stable cardiomediastinal silhouette. Endotracheal and nasogastric tubes are unchanged in position. No pneumothorax or significant pleural effusion is noted. Mild bilateral diffuse interstitial densities are noted which are improved compared to prior exam, suggesting improved pulmonary edema. Bony thorax is unremarkable. IMPRESSION: Stable support apparatus.  Improved bilateral pulmonary edema. Electronically Signed   By: Lupita Raider, M.D.   On: 04/11/2016 09:12   Dg Chest Portable 1 View  Result Date: 04/10/2016 CLINICAL DATA:  Check  endotracheal tube placement EXAM: PORTABLE CHEST 1 VIEW COMPARISON:  None. FINDINGS: Endotracheal tube is noted 4.4 cm above the carina. The cardiac shadow is at the upper limits of normal in size. Diffuse increased density is noted throughout both lungs likely related to diffuse pulmonary edema. No sizable effusions are seen. No bony abnormality is noted. Nasogastric catheter courses into the stomach. IMPRESSION: Endotracheal tube and nasogastric catheter in satisfactory position. Diffuse increased density throughout both lungs likely related to underlying pulmonary edema. Electronically Signed   By: Alcide Clever M.D.   On: 04/10/2016 16:19   Ct Head Code Stroke W/o Cm  Result Date: 04/10/2016 CLINICAL DATA:  Code stroke. Code stroke. Confusion. Left leg weakness. Diabetes. EXAM: CT HEAD WITHOUT CONTRAST TECHNIQUE: Contiguous axial images were obtained from the base of the skull through the vertex without intravenous contrast. COMPARISON:  None. FINDINGS: Brain: There are advanced chronic small vessel ischemic changes throughout the brain. No identifiable acute infarction, mass lesion, hemorrhage, hydrocephalus or extra-axial collection. Vascular: There is atherosclerotic calcification of the major vessels at the base of the brain. Skull: Normal Sinuses/Orbits: Clear/normal Other: None significant ASPECTS (Alberta Stroke Program Early CT Score) - Ganglionic level infarction (caudate, lentiform nuclei, internal capsule, insula, M1-M3 cortex): 7 - Supraganglionic infarction (M4-M6 cortex): 3 Total score (0-10 with 10 being normal): 10 IMPRESSION: 1. Advanced chronic small vessel ischemic changes throughout the brain. No sign of acute cortical or large vessel infarction. There is atherosclerotic calcification of the major vessels at the base of the brain. 2. ASPECTS is 10. These results were called by telephone at the time of interpretation on 04/10/2016 at 3:47 pm to Dr. Amada Jupiter, who verbally acknowledged  these results. Electronically Signed   By: Paulina Fusi M.D.   On: 04/10/2016 15:48     STUDIES:  CT head 10/11 > Advanced chronic small vessel ischemic changes throughout the brain. No sign of acute cortical or large vessel infarction. There is atherosclerotic calcification of the major vessels at the base of the brain. LHC 10/11> Severe, diffuse, heavily calcified 3 vessel disease in a typical diabetic pattern affecting branches and distal vessels. No clear culprit for unstable angina or myocardial infarction. Mild to moderately decreased LV systolic function with an ejection fraction of 40% with akinesis of the apical segments. 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 However, LAD ischemia cannot be excluded given that the LAD wraps around the apex.  CULTURES:   ANTIBIOTICS:   SIGNIFICANT EVENTS: 10/11 > AMS at work, code stroke, ST changes to cath lab, no intervention. Out to ICU on vent.   LINES/TUBES: ETT 10/11  DISCUSSION: 55 year old female with hypoxemic respiratory failure due to flash pulmonary edema secondary to CVA and STEMI with poor CAD.  ASSESSMENT / PLAN:  PULMONARY A: Acute hypoxemic respiratory failure  P:   Extubate Titrate FiO2 to keep  sats 88-92% VAP bundle SLP  CARDIOVASCULAR A:  Severe 3 vessel CAD, ? 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% Hypertensive emergency  P:  Telemetry SBP goal 150-170 Management per cardiology Troponin trending  RENAL A:   No acute issues Risk for AKI due to dye load with cath  P:   KVO IVF Hold further lasix Follow BMP Replace electrolytes as indicated  GASTROINTESTINAL A:   No acute issues  P:   TF Pepcid for SUP  HEMATOLOGIC A:   Anemia, mild  P:  Follow CBC  INFECTIOUS A:   No acute issues  P:   Follow WBC and fever curves  ENDOCRINE A:   DM  P:   CBG monitoring and SSI  NEUROLOGIC A:   No acute deformity or ROM limitation ?Acute CVA ?PRES  P:   RASS  goal: -1 D/C abx for extubation Neurology following CT today  FAMILY  - Updates: No family bedside  - Inter-disciplinary family meet or Palliative Care meeting due by:  10/18  The patient is critically ill with multiple organ systems failure and requires high complexity decision making for assessment and support, frequent evaluation and titration of therapies, application of advanced monitoring technologies and extensive interpretation of multiple databases.   Critical Care Time devoted to patient care services described in this note is  35  Minutes. This time reflects time of care of this signee Dr Koren Bound. This critical care time does not reflect procedure time, or teaching time or supervisory time of PA/NP/Med student/Med Resident etc but could involve care discussion time.  Alyson Reedy, M.D. [facility] Pulmonary/Critical Care Medicine. Pager: [phone]. After hours pager: [phone].

## 2016-04-11 NOTE — Progress Notes (Signed)
Patient's temperature has slowly been increasing throughout shift and is currently 37.7c.  Patient's urine output has decreased to 20 cc/hr.  Paged MD Eldridge DaceVaranasi, new orders received.

## 2016-04-11 NOTE — Progress Notes (Signed)
Pt transported to MRI and back with no complications. 

## 2016-04-11 NOTE — Progress Notes (Signed)
The following patient's belongings retrieved from security per patient's request and sent home with patient's daughter Rebecca Wagner: one gold-colored ring w/ clear jewels, one silver-colored ring w/ clear jewels, 2 silver-colored bracelets, 1 4-color pen, 7 1 dollar bills, 4 quarters, 5 dimes, 5 pennies, 1 nickel  Belongings still with patient include: white colored ball earrings, wig, 2 white tennis shoes, bra, underwear and glasses

## 2016-04-11 NOTE — Procedures (Signed)
Extubation Procedure Note  Patient Details:   Name: Rebecca Wagner DOB: [birth year] MRN: [id number]   Airway Documentation:  Airway 7.5 mm (Active)  Secured at (cm) 24 cm 04/11/2016 11:20 AM  Measured From Lips 04/11/2016 11:20 AM  Secured Location Right 04/11/2016 11:20 AM  Secured By [employer] Tube Holder 04/11/2016 11:20 AM  Tube Holder Repositioned Yes 04/11/2016 11:20 AM  Cuff Pressure (cm H2O) 26 cm H2O 04/11/2016  7:58 AM  Site Condition Dry;Cool 04/11/2016  7:58 AM    Evaluation  O2 sats: stable throughout Complications: No apparent complications Patient did tolerate procedure well. Bilateral Breath Sounds: Rhonchi, Diminished   Yes  Extubated patient to 2L nasal canula. Patient oriented to time and place  Morley KosJohnson, Erianna Jolly Leroy 04/11/2016, 11:43 AM

## 2016-04-11 NOTE — Progress Notes (Addendum)
Patient Name: Rebecca Wagner Date of Encounter: 04/11/2016  Primary Cardiologist: New to [facility] Problem List     Principal Problem:   Acute ischemic left MCA stroke [facility]) Active Problems:   Acute pulmonary edema (HCC)   Hypertensive emergency   ST elevation myocardial infarction (STEMI) of inferolateral wall (HCC)   Multiple vessel coronary artery disease   Diabetes mellitus type 2, insulin dependent ([place])   Acute respiratory failure with hypoxia (HCC)    Subjective   Actually awake, tries to follow commands. Trying to write on white board, but having difficulty. Does not appear to be in any acute distress.  Inpatient Medications    . aspirin  81 mg Oral Daily  . atorvastatin  80 mg Oral q1800  . carvedilol  6.25 mg Oral BID WC  . chlorhexidine gluconate (MEDLINE KIT)  15 mL Mouth Rinse BID  . chlorhexidine gluconate (MEDLINE KIT)  15 mL Mouth Rinse BID  . enoxaparin (LOVENOX) injection  40 mg Subcutaneous Q24H  . famotidine (PEPCID) IV  20 mg Intravenous Q12H  . mouth rinse  15 mL Mouth Rinse 10 times per day  . sodium chloride flush  3 mL Intravenous Q12H    Vital Signs    Vitals:   04/11/16 0630 04/11/16 0700 04/11/16 0730 04/11/16 0758  BP: (!) 145/79 (!) 160/83 (!) 153/80   Pulse: 93 93 91 97  Resp: 20 19 18  (!) 21  Temp:   99.7 F (37.6 C)   TempSrc:   Core (Comment)   SpO2: 100% 100% 100% 100%  Weight:      Height:        Intake/Output Summary (Last 24 hours) at 04/11/16 0813 Last data filed at 04/11/16 0754  Gross per 24 hour  Intake          1149.06 ml  Output             1810 ml  Net          -660.94 ml   Filed Weights   04/10/16 1500 04/10/16 1530 04/11/16 0451  Weight: 75.5 kg (166 lb 7.2 oz) 75.5 kg (166 lb 7.2 oz) 71.7 kg (158 lb 1.1 oz)    Physical Exam   GEN: Well nourished, well developed, in no acute distress.  HEENT: Grossly normal. ET tube in place Neck: Supple, no JVD, no obvious carotid bruits, or  masses. Cardiac: Distant heart sounds. RRR, no murmurs, rubs, or gallops. No clubbing, cyanosis. 1+ bilateral lower extremity edema.  Radials/DP/PT 2+ and equal bilaterally. Right radial cath site clean and intact Respiratory:  Respirations regular and unlabored, clear to auscultation bilaterally anterior lung fields with mild interstitial sounds from ventilator. GI: Soft, nontender, nondistended, BS + x 4. MS: no deformity or atrophy. Skin: warm and dry, no rash. Neuro:  Does appear to be moving all 4 extremities full neuro exam is still difficult because she is somewhat groggy. Psych: AAOx3.  - Somewhat groggy  Labs    CBC  Recent Labs  04/10/16 1525 04/10/16 1534 04/11/16 0610  WBC 7.7  --  13.9*  NEUTROABS 4.5  --  11.0*  HGB 11.9* 12.9 11.8*  HCT 36.0 38.0 36.1  MCV 86.3  --  87.2  PLT 446*  --  564*   Basic Metabolic Panel  Recent Labs  04/10/16 1525 04/10/16 1534 04/11/16 0610  NA 135 137 139  K 3.5 3.5 3.4*  CL 103 103 105  CO2 20*  --  26  GLUCOSE 220* 222* 91  BUN 20 22* 18  CREATININE 0.73 0.60 0.74  CALCIUM 9.3  --  9.1   Liver Function Tests  Recent Labs  04/10/16 1525  AST 19  ALT 13*  ALKPHOS 88  BILITOT 0.6  PROT 7.4  ALBUMIN 3.3*   No results for input(s): LIPASE, AMYLASE in the last 72 hours. Cardiac Enzymes No results for input(s): CKTOTAL, CKMB, CKMBINDEX, TROPONINI in the last 72 hours. BNP Invalid input(s): POCBNP D-Dimer No results for input(s): DDIMER in the last 72 hours. Hemoglobin A1C No results for input(s): HGBA1C in the last 72 hours. Fasting Lipid Panel  Recent Labs  04/10/16 1557  TRIG 203*   Thyroid Function Tests No results for input(s): TSH, T4TOTAL, T3FREE, THYROIDAB in the last 72 hours.  Invalid input(s): FREET3  Telemetry    Sinus rhythm 90s  ECG    Sinus rhythm, 96 BPM. Left atrial enlargement, LVH with repolarization abnormality and left axis deviation (-31); cannot exclude lateral MI, age  undetermined (but not very convincing)  Cardiology     Cardiac Catheterization 04/10/2016  Prox RCA to Dist RCA lesion, 95 %stenosed. -- Diffusely diseased very small caliber probably codominant vessel   Mid LAD to Dist LAD lesion, 99 %stenosed.  Dist LAD lesion, 99 %stenosed.  1st Diag lesion, 90 %stenosed.  Ost 2nd Diag to 2nd Diag lesion, 90 %stenosed.   Prox Cx lesion, 70 %stenosed.  Dist Cx lesion, 90 %stenosed.    Ost 3rd Mrg lesion, 99 %stenosed.  Ost 4th Mrg lesion, 70 %stenosed.  There is mild to moderate left ventricular systolic dysfunction.  LV end diastolic pressure is mildly elevated.  The left ventricular ejection fraction is 35-45% by visual estimate.   Diagnostic Diagram    1. Severe, diffuse, heavily calcified 3 vessel disease in a typical diabetic pattern affecting branches and distal vessels. No clear culprit for unstable angina or myocardial infarction. 2. Mild to moderately decreased LV systolic function with an ejection fraction of 40% with akinesis of the apical segments. 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 However, LAD ischemia cannot be excluded given that the LAD wraps around the apex. Wall Motion         3. Mildly elevated left ventricular end-diastolic pressure.  ~11-13 mmHg  Recommendations: No clear culprit for unstable angina is identified. It's possible that the patient developed flash pulmonary edema in the setting of uncontrolled hypertension with underlying three-vessel coronary artery disease. Recommend blood pressure control with nitroglycerin drip. The LVEDP was actually close to normal when her blood pressure was controlled after she was given one dose of labetalol. I'm still concerned about a possible neurologic event given her labile blood pressure. The patient has no good options for revascularization of her coronary artery disease given poor targets and diffuse disease.  Radiology    Mr Brain Wo  Contrast  Result Date: 04/11/2016 CLINICAL DATA:  55 y/o F; left leg weakness and altered mental status follow by shortness of breath requiring intubation. EXAM: MRI HEAD WITHOUT CONTRAST TECHNIQUE: Multiplanar, multiecho pulse sequences of the brain and surrounding structures were obtained without intravenous contrast. COMPARISON:  04/10/2016 CT head FINDINGS: Brain: Focus of diffusion restriction within subcortical white matter in the left frontoparietal junction and small punctate focus more posteriorly and parietal white matter. Additional punctate focus of diffusion restriction with mildly decreased ADC in the left forceps of the splenium of corpus callosum, slight mass effect, and intermediate T2 signal centrally or ADC is  lowest. There is a background of extensive T2 FLAIR hyperintense signal abnormality throughout subcortical and periventricular white matter of the brain and within the pons compatible with advanced chronic microvascular ischemic changes. No abnormal susceptibility hypointensity to indicate intracranial hemorrhage. No focal mass effect. No extra-axial collection. No hydrocephalus. Mild parenchymal volume loss. Vascular: Normal flow voids. Skull and upper cervical spine: Normal marrow signal. Sinuses/Orbits: Mucosal thickening and fluid levels within the nasal passages and nasopharynx is likely due to intubation. No abnormal signal of mastoid air cells. T2 hyperintense lesion anterior to the left ear within the subcutaneous fat probably represents a dermal appendage cyst measuring up to 14 mm. Other: None. IMPRESSION: 1. Focus of diffusion restriction in subcortical white matter at the left frontal parietal junction consistent with acute/early subacute infarct. 2. Focus of diffusion restriction within the left splenium of corpus callosum with different ADC value from the left frontal parietal lesion. This is probably an additional infarct but somewhat atypical. Follow-up MRI of the brain is  recommended to ensure resolution of diffusion abnormality and exclude the possibility of underlying neoplasm. 3. Background of advanced chronic microvascular ischemic changes and mild parenchymal volume loss. These results will be called to the ordering clinician or representative by the Radiologist Assistant, and communication documented in the PACS or zVision Dashboard. Electronically Signed   By: Kristine Garbe M.D.   On: 04/11/2016 05:04   Dg Chest Portable 1 View  Result Date: 04/10/2016 CLINICAL DATA:  Check endotracheal tube placement EXAM: PORTABLE CHEST 1 VIEW COMPARISON:  None. FINDINGS: Endotracheal tube is noted 4.4 cm above the carina. The cardiac shadow is at the upper limits of normal in size. Diffuse increased density is noted throughout both lungs likely related to diffuse pulmonary edema. No sizable effusions are seen. No bony abnormality is noted. Nasogastric catheter courses into the stomach. IMPRESSION: Endotracheal tube and nasogastric catheter in satisfactory position. Diffuse increased density throughout both lungs likely related to underlying pulmonary edema. Electronically Signed   By: Inez Catalina M.D.   On: 04/10/2016 16:19   Ct Head Code Stroke W/o Cm  Result Date: 04/10/2016 CLINICAL DATA:  Code stroke. Code stroke. Confusion. Left leg weakness. Diabetes. EXAM: CT HEAD WITHOUT CONTRAST TECHNIQUE: Contiguous axial images were obtained from the base of the skull through the vertex without intravenous contrast. COMPARISON:  None. FINDINGS: Brain: There are advanced chronic small vessel ischemic changes throughout the brain. No identifiable acute infarction, mass lesion, hemorrhage, hydrocephalus or extra-axial collection. Vascular: There is atherosclerotic calcification of the major vessels at the base of the brain. Skull: Normal Sinuses/Orbits: Clear/normal Other: None significant ASPECTS ([place] Stroke Program Early CT Score) - Ganglionic level infarction (caudate,  lentiform nuclei, internal capsule, insula, M1-M3 cortex): 7 - Supraganglionic infarction (M4-M6 cortex): 3 Total score (0-10 with 10 being normal): 10 IMPRESSION: 1. Advanced chronic small vessel ischemic changes throughout the brain. No sign of acute cortical or large vessel infarction. There is atherosclerotic calcification of the major vessels at the base of the brain. 2. ASPECTS is 10. These results were called by telephone at the time of interpretation on 04/10/2016 at 3:47 pm to Dr. Leonel Ramsay, who verbally acknowledged these results. Electronically Signed   By: Nelson Chimes M.D.   On: 04/10/2016 15:48    Patient Profile     55 year old female with PMH as below, which is significant for HTN and DM. 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 While at work 10/11 around 1400 she was noted  to be dragging her left foot with AMS. She was transported to [facility] ED as a code stroke and underwent CT scan demonstrating no hemorrhage or clear evidence of acute infarct. Shortly after returning from CT she developed shortness of breath and desaturation and was intubated. ST changes noted during this episode and she was taken to cath lab where diffuse CAD was discovered an no intervention was done. After the cath she was sent to ICU for recovery and further evaluation. PCCM managing Vent & other medical issues. Intubated pre-cath for likely Flash Pulmonary Edema  Neurology has been consulted given MRI Brain findings.  Assessment & Plan    Principal Problem:   Acute ischemic stroke [facility])  Neurology has been consulted. I suspected this was originally presenting problem. Unfortunately, with her chest pain and dyspnea, a cardiac etiology took precedence in the absence of a true bleed.  Now allowing for permissive hypertension and trying to avoid aggressively reducing her blood pressure.  CT angiogram of head and neck is been ordered.  Currently waking up from propofol sedation - appears that she is  having relatively significant amount of recovery.    ~ Possible ST elevation myocardial infarction (STEMI) of inferolateral wall (HCC) - Cath with  Multiple vessel coronary artery disease but no culprit lesion; I personally reviewed the films. Extensively calcified diffusely diseased vessels with no clear PCI or CABG targets. -- Not currently having chest pain despite blood pressure 179/96.  It is unclear as to whether not this is true ACS/MI versus diffuse demand ischemia in setting of hypertensive emergency. EKG does not show signs of evolving inferolateral MI   My suspicion is that with hypertensive emergency, she did get ischemic from microvascular standpoint leading to acute pulmonary edema and acute respiratory failure. -- Interestingly, her LVEDP was relatively normal once her blood pressure stabilized in the Cath Lab.  2-D echocardiogram ordered to better assess EF  We'll follow cardiac enzymes to determine if myocardial injury will do occur.  Medical Management is our only option: On aspirin, high-dose statin and carvedilol. Limited as far as titration of medications secondary to the need of permissive hypertension for stroke.      Acute respiratory failure with hypoxia (HCC) 2/2  -- Hypertensive emergency & Acute pulmonary edema ([place])  Initially started on nitroglycerin drip and the cardiac finger, both were stopped last night and clear reasons-  blood pressure ranges 150-1 70 mmHg.  She has carvedilol 6.25 mg ordered - would like to see how she does with couple dose of carvedilol  Unfortunately we do not yet have her home medication list  Chest x-ray not yet done this morning - yesterday's chest x-ray did not show obvious significant pulmonary edema, maybe some pulling vascular congestion   Unfortunately, with desire for permissive hypertension in the setting of recent stroke, we are now having to allow her pressures to be high. - Depending on this mornings chest x-ray, may need to  consider adding diuretic with edema     Diabetes mellitus type 2, insulin dependent ([place])  Currently on insulin drip - managed by PCCM   The patient is critically ill with multiple organ systems failure and requires high complexity decision making for assessment and support, frequent evaluation and titration of therapies, application of advanced monitoring technologies and extensive interpretation of multiple databases.   Critical Care Time devoted to patient care services described in this note is  65 Minutes. This time reflects time of care of this signee Dr  Jennet Maduro. This critical care time does not reflect procedure time, or teaching time or supervisory time of PA/NP/Med student/Med Resident etc but could involve care discussion time.     Signed, Glenetta Hew, MD Glenetta Hew, M.D., M.S. Interventional Cardiologist   Pager # [phone] Phone # [phone] [address] [place], [place] [address]  04/11/2016, 8:13 AM

## 2016-04-11 NOTE — Progress Notes (Signed)
eLink Physician-Brief Progress Note Patient Name: Stevphen MeuseValerie S Belger DOB: 1961-01-30 MRN: [id number]   Date of Service  04/11/2016  HPI/Events of Note  Called about MRI results:Focus of diffusion restriction in subcortical white matter at the left frontal parietal junction consistent with acute/early subacute infarct.  eICU Interventions  Will place order for Neuro consult     Intervention Category Evaluation Type: Other  Asad Keeven 04/11/2016, 6:45 AM

## 2016-04-12 DIAGNOSIS — R9431 Abnormal electrocardiogram [ECG] [EKG]: Secondary | ICD-10-CM

## 2016-04-12 LAB — BASIC METABOLIC PANEL
Anion gap: 8 (ref 5–15)
BUN: 11 mg/dL (ref 6–20)
CO2: 24 mmol/L (ref 22–32)
CREATININE: 0.58 mg/dL (ref 0.44–1.00)
Calcium: 8.5 mg/dL — ABNORMAL LOW (ref 8.9–10.3)
Chloride: 105 mmol/L (ref 101–111)
Glucose, Bld: 156 mg/dL — ABNORMAL HIGH (ref 65–99)
POTASSIUM: 3.1 mmol/L — AB (ref 3.5–5.1)
SODIUM: 137 mmol/L (ref 135–145)

## 2016-04-12 LAB — CBC
HCT: 31.1 % — ABNORMAL LOW (ref 36.0–46.0)
HEMOGLOBIN: 10 g/dL — AB (ref 12.0–15.0)
MCH: 27.9 pg (ref 26.0–34.0)
MCHC: 32.2 g/dL (ref 30.0–36.0)
MCV: 86.9 fL (ref 78.0–100.0)
PLATELETS: 382 10*3/uL (ref 150–400)
RBC: 3.58 MIL/uL — AB (ref 3.87–5.11)
RDW: 15.4 % (ref 11.5–15.5)
WBC: 9 10*3/uL (ref 4.0–10.5)

## 2016-04-12 LAB — GLUCOSE, CAPILLARY
GLUCOSE-CAPILLARY: 164 mg/dL — AB (ref 65–99)
Glucose-Capillary: 177 mg/dL — ABNORMAL HIGH (ref 65–99)
Glucose-Capillary: 165 mg/dL — ABNORMAL HIGH (ref 65–99)
Glucose-Capillary: 148 mg/dL — ABNORMAL HIGH (ref 65–99)

## 2016-04-12 LAB — HEMOGLOBIN A1C
HEMOGLOBIN A1C: 12.7 % — AB (ref 4.8–5.6)
Mean Plasma Glucose: 318 mg/dL

## 2016-04-12 LAB — MAGNESIUM: MAGNESIUM: 1.6 mg/dL — AB (ref 1.7–2.4)

## 2016-04-12 LAB — PHOSPHORUS: Phosphorus: 2.8 mg/dL (ref 2.5–4.6)

## 2016-04-12 MED ORDER — LIVING WELL WITH DIABETES BOOK
Freq: Once | Status: AC
  Administered 2016-04-12: 1
  Filled 2016-04-12: qty 1

## 2016-04-12 MED ORDER — INSULIN GLARGINE 100 UNIT/ML ~~LOC~~ SOLN
10.0000 [IU] | Freq: Every day | SUBCUTANEOUS | Status: DC
  Administered 2016-04-12 – 2016-04-15 (×4): 10 [IU] via SUBCUTANEOUS
  Filled 2016-04-12 (×4): qty 0.1

## 2016-04-12 MED ORDER — POTASSIUM CHLORIDE CRYS ER 20 MEQ PO TBCR
40.0000 meq | EXTENDED_RELEASE_TABLET | Freq: Once | ORAL | Status: AC
  Administered 2016-04-12: 40 meq via ORAL
  Filled 2016-04-12: qty 2

## 2016-04-12 MED ORDER — MAGNESIUM SULFATE IN D5W 1-5 GM/100ML-% IV SOLN
1.0000 g | Freq: Once | INTRAVENOUS | Status: AC
Start: 2016-04-12 — End: 2016-04-12
  Administered 2016-04-12: 1 g via INTRAVENOUS
  Filled 2016-04-12: qty 100

## 2016-04-12 MED ORDER — POTASSIUM CHLORIDE CRYS ER 20 MEQ PO TBCR
40.0000 meq | EXTENDED_RELEASE_TABLET | Freq: Every day | ORAL | Status: DC
  Administered 2016-04-13 – 2016-04-15 (×3): 40 meq via ORAL
  Filled 2016-04-12 (×3): qty 2

## 2016-04-12 MED ORDER — INSULIN STARTER KIT- PEN NEEDLES (ENGLISH)
1.0000 | Freq: Once | Status: AC
  Administered 2016-04-12: 1
  Filled 2016-04-12: qty 1

## 2016-04-12 MED ORDER — MAGNESIUM SULFATE IN D5W 1-5 GM/100ML-% IV SOLN
1.0000 g | Freq: Once | INTRAVENOUS | Status: AC
  Administered 2016-04-12: 1 g via INTRAVENOUS
  Filled 2016-04-12: qty 100

## 2016-04-12 MED ORDER — FAMOTIDINE 20 MG PO TABS
20.0000 mg | ORAL_TABLET | Freq: Two times a day (BID) | ORAL | Status: DC
  Administered 2016-04-12 – 2016-04-15 (×7): 20 mg via ORAL
  Filled 2016-04-12 (×7): qty 1

## 2016-04-12 MED ORDER — INFLUENZA VAC SPLIT QUAD 0.5 ML IM SUSY
0.5000 mL | PREFILLED_SYRINGE | Freq: Once | INTRAMUSCULAR | Status: AC
  Administered 2016-04-12: 0.5 mL via INTRAMUSCULAR
  Filled 2016-04-12: qty 0.5

## 2016-04-12 MED ORDER — INFLUENZA VAC SPLIT QUAD 0.5 ML IM SUSY: 0.5000 mL | PREFILLED_SYRINGE | INTRAMUSCULAR | Status: DC

## 2016-04-12 MED ORDER — LOSARTAN POTASSIUM 50 MG PO TABS
25.0000 mg | ORAL_TABLET | Freq: Every day | ORAL | Status: DC
  Administered 2016-04-13 – 2016-04-15 (×3): 25 mg via ORAL
  Filled 2016-04-12 (×3): qty 1

## 2016-04-12 NOTE — Progress Notes (Signed)
Speech Language Pathology Treatment: Dysphagia  Patient Details Name: Rebecca Wagner MRN: [id number] DOB: December 10, 1960 Today's Date: 04/12/2016 Time: 9437-0052 SLP Time Calculation (min) (ACUTE ONLY): 10 min  Assessment / Plan / Recommendation Clinical Impression  F/u after yesterday's swallow assessment.  Pt with excellent toleration of POs with no s/s of aspiration, even when swallow mechanism is taxed.  Continue current regular diet, thin liquids.  No further SLP f//u warrantee - our services will sign off.    HPI HPI: 55 year old female with hypoxemic respiratory failure due to flash pulmonary edema secondary to ? CVA (admitted with left sided weakness and AMS) and STEMI with poor CAD. Intubated 10/11-10/12. MRI: Focus of diffusion restriction in subcortical white matter at the      SLP Plan  All goals met     Recommendations  Diet recommendations: Regular;Thin liquid Liquids provided via: Cup;Straw Medication Administration: Whole meds with liquid Supervision: Patient able to self feed                Oral Care Recommendations: Oral care BID Follow up Recommendations: None Plan: All goals met       GO                Rebecca Wagner Laurice 04/12/2016, 8:53 AM

## 2016-04-12 NOTE — Evaluation (Signed)
Physical Therapy Evaluation Patient Details Name: Rebecca Wagner MRN: [id number] DOB: January 13, 1961 Today's Date: 04/12/2016   History of Present Illness  55 year old female with PMH as below, which is significant for HTN and DM. 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 While at work 10/11 around 1400 she was noted to be dragging her left foot with AMS. [facility] as a [facility] stroke and underwent CT scan demonstrating no hemorrhage or clear evidence of acute infarct. Shortly after returning from CT she developed shortness of breath and desaturation and was intubated. ST changes noted during this episode and she was taken to cath lab where diffuse CAD was discovered an no intervention was done. After the cath she was sent to ICU for recovery and further evaluation. PCCM managing Vent & other medical issues.  Left fronto-parietal infarct.  Clinical Impression  Pt admitted with above diagnosis. Pt currently with functional limitations due to the deficits listed below (see PT Problem List). Pt was able to ambulate in halls.  Good balance overall.  Instructed pt to just be cautious initially upon d/c home.  Feel she is close to baseline.   Pt will benefit from skilled PT to increase their independence and safety with mobility to allow discharge to the venue listed below.      Follow Up Recommendations No PT follow up    Equipment Recommendations  None recommended by PT    Recommendations for Other Services       Precautions / Restrictions Precautions Precautions: Fall Restrictions Weight Bearing Restrictions: No      Mobility  Bed Mobility Overal bed mobility: Independent                Transfers Overall transfer level: Independent                  Ambulation/Gait Ambulation/Gait assistance: Supervision Ambulation Distance (Feet): 350 Feet Assistive device: None Gait Pattern/deviations: WFL(Within Functional Limits)   Gait velocity  interpretation: <1.8 ft/sec, indicative of risk for recurrent falls General Gait Details: Pt did very well overall.  Slight dizziness reported once but could not determine reason.  No significant LOB. Pt does have slow and guarded gait.  Stairs            Wheelchair Mobility    Modified Rankin (Stroke Patients Only) Modified Rankin (Stroke Patients Only) Pre-Morbid Rankin Score: No symptoms Modified Rankin: Slight disability     Balance Overall balance assessment: Needs assistance         Standing balance support: No upper extremity supported;During functional activity Standing balance-Leahy Scale: Fair Standing balance comment: can stand statically without assist.                 Standardized Balance Assessment Standardized Balance Assessment : Dynamic Gait Index   Dynamic Gait Index Level Surface: Normal Change in Gait Speed: Normal Gait with Horizontal Head Turns: Mild Impairment Gait with Vertical Head Turns: Normal Gait and Pivot Turn: Mild Impairment Step Over Obstacle: Normal Step Around Obstacles: Normal Steps: Mild Impairment Total Score: 21       Pertinent Vitals/Pain Pain Assessment: No/denies pain  VSS with BP 178/99 with nurse aware.      Home Living Family/patient expects to be discharged to:: Private residence Living Arrangements: Alone Available Help at Discharge: Family;Available PRN/intermittently (son and daughter from out of town.) Type of Home: House Home Access: Stairs to enter Entrance Stairs-Rails: Right;Left;Can reach both Enter[employer] CorporationEntrance Stairs-Number of Steps:  4 Home Layout: Two level Home Equipment: None Additional Comments: works FT at [employer].    Prior Function Level of Independence: Independent               Hand Dominance        Extremity/Trunk Assessment   Upper Extremity Assessment: Defer to OT evaluation           Lower Extremity Assessment: Overall WFL for tasks assessed      Cervical /  Trunk Assessment: Normal  Communication   Communication: No difficulties  Cognition Arousal/Alertness: Awake/alert Behavior During Therapy: WFL for tasks assessed/performed Overall Cognitive Status: Within Functional Limits for tasks assessed                      General Comments General comments (skin integrity, edema, etc.): Scored 21/24 on DGI suggesting low risk of falls.  Did screen for vestibular issues as pt was dizzy x1 during walk but negative testing.  Pt did not want to practice up and down steps today.     Exercises General Exercises - Lower Extremity Ankle Circles/Pumps: AROM;Both;10 reps;Seated Long Arc Quad: AROM;Both;10 reps;Seated   Assessment/Plan    PT Assessment Patient needs continued PT services  PT Problem List Decreased activity tolerance;Decreased balance;Decreased mobility          PT Treatment Interventions DME instruction;Stair training;Gait training;Functional mobility training;Therapeutic activities;Therapeutic exercise;Balance training;Patient/family education    PT Goals (Current goals can be found in the Care Plan section)  Acute Rehab PT Goals Patient Stated Goal: to go home PT Goal Formulation: With patient Time For Goal Achievement: 04/26/16 Potential to Achieve Goals: Good    Frequency Min 4X/week   Barriers to discharge Decreased caregiver support      Co-evaluation               End of Session Equipment Utilized During Treatment: Gait belt Activity Tolerance: Patient limited by fatigue Patient left: in chair;with call bell/phone within reach Nurse Communication: Mobility status         Time: 4098-1191 PT Time Calculation (min) (ACUTE ONLY): 21 min   Charges:   PT Evaluation $PT Eval Moderate Complexity: 1 Procedure     PT G CodesBerline Lopes 04/27/2016, 4:28 PM Akaiya Touchette [facility] Acute Rehabilitation [phone] (pager)

## 2016-04-12 NOTE — Consult Note (Signed)
   [facility]   04/12/2016  [name] [birth year] [id number]    [facility]/Link to Wellness follow up.  Spoke with Ms. Rebecca BirkCaldwell at bedside on behalf of Link to [facility] Care Management program for [facility] employees/dependents with [facility] [employer] insurance. Discussed importance of Primary Care MD follow up. States she will call to make appointment with Dr Cleda DaubBetty Wagner post discharge. Discussed Link to Wellness program for DM management. Provided another Link to [employer] packet. Provided necessary benefit numbers and the like. Appreciative of assistance. Agreeable to post discharge call and agreeable to be called for Link to Wellness appointment as well. Confirmed best contact number as [phone].    Raiford NobleAtika Hall, MSN-Ed, RN,BSN [facility] Care Management Hospital Liaison [phone]

## 2016-04-12 NOTE — Consult Note (Signed)
   [facility]   04/12/2016  Stevphen MeuseValerie S Feasel 1961-03-10 [id number]   Came to bedside to visit on behalf of [facility] Care Management program for [facility] employees/dependents with [facility] insurance. However, she had medical personnel at bedside. Will follow up at later time.   Raiford NobleAtika Hall, MSN-Ed, RN,BSN [facility] Care Management Hospital Liaison [phone]

## 2016-04-12 NOTE — Progress Notes (Signed)
PULMONARY / CRITICAL CARE MEDICINE   Name: KIRK SAMPLEY MRN: [id number] DOB: 07/09/1960    ADMISSION DATE:  04/10/2016 CONSULTATION DATE:  04/10/2016  REFERRING MD:  Dr. Kirke Corin  CHIEF COMPLAINT:  AMS  HISTORY OF PRESENT ILLNESS:  Patient is encephalopathic and/or intubated. Therefore history has been obtained from chart review. 55 year old female with PMH as below, which is significant for HTN and DM. 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 While at work 10/11 around 1400 she was noted to be dragging her left foot with AMS. She was transported to [facility] ED as a code stroke and underwent CT scan demonstrating no hemorrhage or clear evidence of acute infarct. Shortly after returning from CT she developed shortness of breath and desaturation and was intubated. ST changes noted during this episode and she was taken to cath lab where diffuse CAD was discovered and no intervention was done. After the cath she was sent to ICU for recovery and further evaluation. PCCM to see.   SUBJECTIVE:  Extubated with no events overnight.  VITAL SIGNS: BP (!) 180/92 (BP Location: Left Arm)   Pulse 92   Temp 98.3 F (36.8 C)   Resp 18   Ht 5\' 7"  (1.702 m)   Wt 71.7 kg (158 lb 1.1 oz)   SpO2 95%   BMI 24.76 kg/m   HEMODYNAMICS:    VENTILATOR SETTINGS: Vent Mode: PRVC FiO2 (%):  [40 %] 40 % Set Rate:  [16 bmp] 16 bmp Vt Set:  [500 mL] 500 mL PEEP:  [5 cmH20] 5 cmH20  INTAKE / OUTPUT: I/O last 3 completed shifts: In: 1823.4 [P.O.:480; I.V.:933.4; Other:60; NG/GT:200; IV Piggyback:150] Out: 2550 [Urine:2500; Emesis/NG output:50]  PHYSICAL EXAMINATION: General:  Female of normal body habitus in NAD Neuro:  Awake, follows commands in all ext, interactive, extubated. HEENT:  [place]/AT. PERRL, mild JVD. Cardiovascular: RRR, no MRG, nl S1/S2. Lungs:  CTA bilaterally Abdomen:  Soft, non-distended Musculoskeletal:  No acute deformity or ROM limitation.  Skin:  Grossly  intact  LABS:  BMET  Recent Labs Lab 04/10/16 1525 04/10/16 1534 04/11/16 0610 04/12/16 0244  NA 135 137 139 137  K 3.5 3.5 3.4* 3.1*  CL 103 103 105 105  CO2 20*  --  26 24  BUN 20 22* 18 11  CREATININE 0.73 0.60 0.74 0.58  GLUCOSE 220* 222* 91 156*   Electrolytes  Recent Labs Lab 04/10/16 1525 04/11/16 0610 04/12/16 0244  CALCIUM 9.3 9.1 8.5*  MG  --   --  1.6*  PHOS  --   --  2.8   CBC  Recent Labs Lab 04/10/16 1525 04/10/16 1534 04/11/16 0610 04/12/16 0244  WBC 7.7  --  13.9* 9.0  HGB 11.9* 12.9 11.8* 10.0*  HCT 36.0 38.0 36.1 31.1*  PLT 446*  --  425* 382   Coag's  Recent Labs Lab 04/10/16 1525  APTT 29  INR 0.99   Sepsis Markers No results for input(s): LATICACIDVEN, PROCALCITON, O2SATVEN in the last 168 hours.  ABG  Recent Labs Lab 04/10/16 1740  PHART 7.376  PCO2ART 41.4  PO2ART 183.0*   Liver Enzymes  Recent Labs Lab 04/10/16 1525  AST 19  ALT 13*  ALKPHOS 88  BILITOT 0.6  ALBUMIN 3.3*   Cardiac Enzymes  Recent Labs Lab 04/11/16 0820 04/11/16 1430  TROPONINI 0.17* 0.16*   Glucose  Recent Labs Lab 04/11/16 0952 04/11/16 1039 04/11/16 1142 04/11/16 1554 04/11/16 2214 04/12/16 0745  GLUCAP 184*  165* 154* 130* 209* 164*   Imaging Ct Angio Head W Or Wo Contrast  Result Date: 04/11/2016 CLINICAL DATA:  Stroke EXAM: CT ANGIOGRAPHY HEAD AND NECK TECHNIQUE: Multidetector CT imaging of the head and neck was performed using the standard protocol during bolus administration of intravenous contrast. Multiplanar CT image reconstructions and MIPs were obtained to evaluate the vascular anatomy. Carotid stenosis measurements (when applicable) are obtained utilizing NASCET criteria, using the distal internal carotid diameter as the denominator. CONTRAST:  50 mL Isovue 370 IV COMPARISON:  MRI head 04/11/2016 FINDINGS: CTA NECK FINDINGS Aortic arch: Mild atherosclerotic calcification aortic arch without aneurysm or dissection.  Proximal great vessels widely patent with mild atherosclerotic disease of the origin of the left subclavian artery. Right carotid system: Right common carotid artery widely patent without stenosis. Mild atherosclerotic disease at the right carotid bifurcation without significant stenosis or dissection Left carotid system: Left common carotid artery widely patent with scattered areas of mild atherosclerotic calcification. Mild atherosclerotic disease in the carotid bulb on the left without significant carotid stenosis. No dissection. Vertebral arteries: Both vertebral arteries are equal in size and patent to the basilar. Proximal vertebral arteries widely patent. Atherosclerotic calcification and stenosis of the distal vertebral artery at the level of the dura. Moderate to severe stenosis on the left and moderate stenosis on the right. Skeleton: Negative Other neck: Negative for mass or adenopathy. Upper chest: Endotracheal tube just above the carina. No mass or adenopathy in the neck. NG tube in the esophagus. Small pleural effusions bibasilar bilaterally with bibasilar atelectasis. Review of the MIP images confirms the above findings CTA HEAD FINDINGS Anterior circulation: Extensive atherosclerotic calcification in the cavernous carotid bilaterally causing moderate to severe stenosis bilaterally. Carotid in remains patent bilaterally. Anterior and middle cerebral arteries patent bilaterally without significant stenosis. Negative for aneurysm. Posterior circulation: Moderate to severe stenosis distal left vertebral artery and moderate stenosis distal right vertebral artery. PICA patent bilaterally. Basilar widely patent. Superior cerebellar and posterior cerebral arteries patent bilaterally. Severe stenosis proximal left posterior cerebral artery and mild stenosis distal left posterior cerebral artery. Mild stenosis proximal right posterior cerebral artery. No aneurysm in the posterior circulation Venous sinuses:  Patent Anatomic variants: None Delayed phase: Normal enhancement on delayed imaging. Chronic microvascular ischemic change throughout the white matter, advanced for age. No intracranial hemorrhage or mass. Review of the MIP images confirms the above findings IMPRESSION: Mild atherosclerotic disease of the carotid bifurcation bilaterally without significant carotid stenosis. 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 Moderate to severe stenosis distal left vertebral artery and moderate stenosis distal right vertebral artery. Severe stenosis proximal left posterior cerebral artery with diffuse disease in the posterior cerebral artery bilaterally. Chronic microvascular ischemic change in the white matter, advanced for age Small bilateral effusions with bibasilar atelectasis. Electronically Signed   By: Marlan Palauharles  Clark M.D.   On: 04/11/2016 11:32   Ct Angio Neck W Or Wo Contrast  Result Date: 04/11/2016 CLINICAL DATA:  Stroke EXAM: CT ANGIOGRAPHY HEAD AND NECK TECHNIQUE: Multidetector CT imaging of the head and neck was performed using the standard protocol during bolus administration of intravenous contrast. Multiplanar CT image reconstructions and MIPs were obtained to evaluate the vascular anatomy. Carotid stenosis measurements (when applicable) are obtained utilizing NASCET criteria, using the distal internal carotid diameter as the denominator. CONTRAST:  50 mL Isovue 370 IV COMPARISON:  MRI head 04/11/2016 FINDINGS: CTA NECK FINDINGS Aortic arch: Mild atherosclerotic calcification aortic arch without aneurysm or dissection. Proximal great  vessels widely patent with mild atherosclerotic disease of the origin of the left subclavian artery. Right carotid system: Right common carotid artery widely patent without stenosis. Mild atherosclerotic disease at the right carotid bifurcation without significant stenosis or dissection Left carotid system: Left common carotid artery widely patent with  scattered areas of mild atherosclerotic calcification. Mild atherosclerotic disease in the carotid bulb on the left without significant carotid stenosis. No dissection. Vertebral arteries: Both vertebral arteries are equal in size and patent to the basilar. Proximal vertebral arteries widely patent. Atherosclerotic calcification and stenosis of the distal vertebral artery at the level of the dura. Moderate to severe stenosis on the left and moderate stenosis on the right. Skeleton: Negative Other neck: Negative for mass or adenopathy. Upper chest: Endotracheal tube just above the carina. No mass or adenopathy in the neck. NG tube in the esophagus. Small pleural effusions bibasilar bilaterally with bibasilar atelectasis. Review of the MIP images confirms the above findings CTA HEAD FINDINGS Anterior circulation: Extensive atherosclerotic calcification in the cavernous carotid bilaterally causing moderate to severe stenosis bilaterally. Carotid in remains patent bilaterally. Anterior and middle cerebral arteries patent bilaterally without significant stenosis. Negative for aneurysm. Posterior circulation: Moderate to severe stenosis distal left vertebral artery and moderate stenosis distal right vertebral artery. PICA patent bilaterally. Basilar widely patent. Superior cerebellar and posterior cerebral arteries patent bilaterally. Severe stenosis proximal left posterior cerebral artery and mild stenosis distal left posterior cerebral artery. Mild stenosis proximal right posterior cerebral artery. No aneurysm in the posterior circulation Venous sinuses: Patent Anatomic variants: None Delayed phase: Normal enhancement on delayed imaging. Chronic microvascular ischemic change throughout the white matter, advanced for age. No intracranial hemorrhage or mass. Review of the MIP images confirms the above findings IMPRESSION: Mild atherosclerotic disease of the carotid bifurcation bilaterally without significant carotid  stenosis. 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 Moderate to severe stenosis distal left vertebral artery and moderate stenosis distal right vertebral artery. Severe stenosis proximal left posterior cerebral artery with diffuse disease in the posterior cerebral artery bilaterally. Chronic microvascular ischemic change in the white matter, advanced for age Small bilateral effusions with bibasilar atelectasis. Electronically Signed   By: Marlan Palau M.D.   On: 04/11/2016 11:32   STUDIES:  CT head 10/11 > Advanced chronic small vessel ischemic changes throughout the brain. No sign of acute cortical or large vessel infarction. There is atherosclerotic calcification of the major vessels at the base of the brain. LHC 10/11> Severe, diffuse, heavily calcified 3 vessel disease in a typical diabetic pattern affecting branches and distal vessels. No clear culprit for unstable angina or myocardial infarction. Mild to moderately decreased LV systolic function with an ejection fraction of 40% with akinesis of the apical segments. 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 However, LAD ischemia cannot be excluded given that the LAD wraps around the apex.  CULTURES: None  ANTIBIOTICS: None  SIGNIFICANT EVENTS: 10/11 > AMS at work, code stroke, ST changes to cath lab, no intervention. Out to ICU on vent.   LINES/TUBES: ETT 10/11>>>10/12  DISCUSSION: 55 year old female with hypoxemic respiratory failure due to flash pulmonary edema secondary to CVA and STEMI with poor CAD.  ASSESSMENT / PLAN:  PULMONARY A: Acute hypoxemic respiratory failure  P:   Extubated and doing well Titrate FiO2 to keep sats 88-92% IS per RT protocol. Ambulate  CARDIOVASCULAR A:  Severe 3 vessel CAD, ? 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% Hypertensive emergency  P:  Telemetry  SBP goal 150-170 Management per cardiology  RENAL A:   No acute issues Risk for AKI due to  dye load with cath  P:   KVO IVF Hold further lasix Follow BMP Replace electrolytes as indicated  GASTROINTESTINAL A:   No acute issues  P:   Diet per SLP recommendations Pepcid for SUP  HEMATOLOGIC A:   Anemia, mild  P:  Follow CBC  INFECTIOUS A:   No acute issues  P:   Follow WBC and fever curves  ENDOCRINE A:   DM  P:   CBG monitoring and SSI  NEUROLOGIC A:   No acute deformity or ROM limitation Acute CVA noted ?PRES  P:   Minimize sedation Neurology following  FAMILY  - Updates: Patient updated bedside.  - Inter-disciplinary family meet or Palliative Care meeting due by:  10/18  Discussed with Dr. Herbie Baltimore, ok to transfer to the SDU and PCCM will sign off.  Alyson Reedy, M.D. [facility] Pulmonary/Critical Care Medicine. Pager: [phone]. After hours pager: [phone].

## 2016-04-12 NOTE — Progress Notes (Signed)
Patient Name: Rebecca Wagner Date of Encounter: 04/12/2016  Primary Cardiologist: New to [facility] Problem List     Principal Problem:   Left Frontal-Parietal CVA Active Problems:   Acute pulmonary edema (HCC)   Hypertensive emergency   ST elevation - in setting of CVA & HTN Emergency - NOT MI   Multiple vessel coronary artery disease   Acute respiratory failure with hypoxia (HCC)   Diabetes mellitus type 2, insulin dependent (HCC)    Subjective   Extubated yesterday = now awake & alert.  No CP, sore throat from ETT, but no dyspnea; no PND/orthopnea Sx. Moves all 4 Ext  Neurology ? Signed off  Inpatient Medications    . aspirin  81 mg Oral Daily  . atorvastatin  80 mg Oral q1800  . carvedilol  6.25 mg Oral BID WC  . clopidogrel  75 mg Oral Daily  . enoxaparin (LOVENOX) injection  40 mg Subcutaneous Q24H  . famotidine  20 mg Oral BID  . [START ON 04/13/2016] Influenza vac split quadrivalent PF  0.5 mL Intramuscular Tomorrow-1000  . insulin aspart  0-15 Units Subcutaneous TID WC  . insulin aspart  0-5 Units Subcutaneous QHS  . insulin glargine  10 Units Subcutaneous Daily  . [START ON 04/13/2016] losartan  25 mg Oral Daily  . magnesium sulfate 1 - 4 g bolus IVPB  1 g Intravenous Once  . [START ON 04/13/2016] potassium chloride  40 mEq Oral Daily  . sodium chloride flush  3 mL Intravenous Q12H    Vital Signs    Vitals:   04/12/16 0500 04/12/16 0600 04/12/16 0700 04/12/16 0800  BP: (!) 140/93 (!) 161/87 (!) 163/97 (!) 180/92  Pulse: 81 89 93 92  Resp: 15 20 16 18   Temp:    98.3 F (36.8 C)  TempSrc:      SpO2: 96% 93% 96% 95%  Weight:      Height:        Intake/Output Summary (Last 24 hours) at 04/12/16 1017 Last data filed at 04/12/16 0800  Gross per 24 hour  Intake           925.01 ml  Output             1315 ml  Net          -389.99 ml   Filed Weights   04/10/16 1500 04/10/16 1530 04/11/16 0451  Weight: 75.5 kg (166 lb 7.2 oz) 75.5 kg  (166 lb 7.2 oz) 71.7 kg (158 lb 1.1 oz)    Physical Exam   GEN: Well nourished, well developed, in no acute distress.  Wide awake HEENT: Grossly normal.  Neck: Supple, no JVD, no obvious carotid bruits, or masses. Cardiac: Distant heart sounds. RRR, no murmurs, rubs, or gallops. No clubbing, cyanosis. Minimal bilateral lower extremity edema.  Radials/DP/PT 2+ and equal bilaterally. Right radial cath site clean and intact Respiratory:  Respirations regular and unlabored, clear to auscultation bilaterally anterior lung fields with mild interstitial sounds from ventilator. GI: Soft, nontender, nondistended, BS + x 4. MS: no deformity or atrophy. Skin: warm and dry, no rash. Neuro:  Does appear to be moving all 4 extremities full neuro exam is still difficult because she is somewhat groggy. Psych: AAOx3.  - Normal Mood & affect  Labs    CBC  Recent Labs  04/10/16 1525  04/11/16 0610 04/12/16 0244  WBC 7.7  --  13.9* 9.0  NEUTROABS 4.5  --  11.0*  --  HGB 11.9*  < > 11.8* 10.0*  HCT 36.0  < > 36.1 31.1*  MCV 86.3  --  87.2 86.9  PLT 446*  --  425* 382  < > = values in this interval not displayed. Basic Metabolic Panel  Recent Labs  04/11/16 0610 04/12/16 0244  NA 139 137  K 3.4* 3.1*  CL 105 105  CO2 26 24  GLUCOSE 91 156*  BUN 18 11  CREATININE 0.74 0.58  CALCIUM 9.1 8.5*  MG  --  1.6*  PHOS  --  2.8   Liver Function Tests  Recent Labs  04/10/16 1525  AST 19  ALT 13*  ALKPHOS 88  BILITOT 0.6  PROT 7.4  ALBUMIN 3.3*   No results for input(s): LIPASE, AMYLASE in the last 72 hours. Cardiac Enzymes  Recent Labs  04/11/16 0820 04/11/16 1430  TROPONINI 0.17* 0.16*   BNP Invalid input(s): POCBNP D-Dimer No results for input(s): DDIMER in the last 72 hours. Hemoglobin A1C  Recent Labs  04/10/16 0610  HGBA1C 12.7*   Fasting Lipid Panel  Recent Labs  04/11/16 0610  CHOL 257*  HDL 65  LDLCALC 165*  TRIG 134  CHOLHDL 4.0   Thyroid Function  Tests  Recent Labs  04/11/16 0918  TSH 0.630    Telemetry    Sinus rhythm 90s  ECG    Sinus rhythm, 96 BPM. Left atrial enlargement, LVH with repolarization abnormality and left axis deviation (-31); cannot exclude lateral MI, age undetermined (but not very convincing)  Cardiology     Cardiac Catheterization 04/10/2016  Prox RCA to Dist RCA lesion, 95 %stenosed. -- Diffusely diseased very small caliber probably codominant vessel   Mid LAD to Dist LAD lesion, 99 %stenosed.  Dist LAD lesion, 99 %stenosed.  1st Diag lesion, 90 %stenosed.  Ost 2nd Diag to 2nd Diag lesion, 90 %stenosed.   Prox Cx lesion, 70 %stenosed.  Dist Cx lesion, 90 %stenosed.    Ost 3rd Mrg lesion, 99 %stenosed.  Ost 4th Mrg lesion, 70 %stenosed.  There is mild to moderate left ventricular systolic dysfunction.  LV end diastolic pressure is mildly elevated.  The left ventricular ejection fraction is 35-45% by visual estimate.   Diagnostic Diagram    1. Severe, diffuse, heavily calcified 3 vessel disease in a typical diabetic pattern affecting branches and distal vessels. No clear culprit for unstable angina or myocardial infarction. 2. Mild to moderately decreased LV systolic function with an ejection fraction of 40% with akinesis of the apical segments. 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 However, LAD ischemia cannot be excluded given that the LAD wraps around the apex. Wall Motion         3. Mildly elevated left ventricular end-diastolic pressure.  ~11-13 mmHg  Recommendations: No clear culprit for unstable angina is identified. It's possible that the patient developed flash pulmonary edema in the setting of uncontrolled hypertension with underlying three-vessel coronary artery disease. Recommend blood pressure control with nitroglycerin drip. The LVEDP was actually close to normal when her blood pressure was controlled after she was given one dose of labetalol. I'm  still concerned about a possible neurologic event given her labile blood pressure. The patient has no good options for revascularization of her coronary artery disease given poor targets and diffuse disease.  Radiology    Ct Angio Head W Or Wo Contrast  Result Date: 04/11/2016 CLINICAL DATA:  Stroke EXAM: CT ANGIOGRAPHY HEAD AND NECK TECHNIQUE: Multidetector CT imaging of the head and  neck was performed using the standard protocol during bolus administration of intravenous contrast. Multiplanar CT image reconstructions and MIPs were obtained to evaluate the vascular anatomy. Carotid stenosis measurements (when applicable) are obtained utilizing NASCET criteria, using the distal internal carotid diameter as the denominator. CONTRAST:  50 mL Isovue 370 IV COMPARISON:  MRI head 04/11/2016 FINDINGS: CTA NECK FINDINGS Aortic arch: Mild atherosclerotic calcification aortic arch without aneurysm or dissection. Proximal great vessels widely patent with mild atherosclerotic disease of the origin of the left subclavian artery. Right carotid system: Right common carotid artery widely patent without stenosis. Mild atherosclerotic disease at the right carotid bifurcation without significant stenosis or dissection Left carotid system: Left common carotid artery widely patent with scattered areas of mild atherosclerotic calcification. Mild atherosclerotic disease in the carotid bulb on the left without significant carotid stenosis. No dissection. Vertebral arteries: Both vertebral arteries are equal in size and patent to the basilar. Proximal vertebral arteries widely patent. Atherosclerotic calcification and stenosis of the distal vertebral artery at the level of the dura. Moderate to severe stenosis on the left and moderate stenosis on the right. Skeleton: Negative Other neck: Negative for mass or adenopathy. Upper chest: Endotracheal tube just above the carina. No mass or adenopathy in the neck. NG tube in the  esophagus. Small pleural effusions bibasilar bilaterally with bibasilar atelectasis. Review of the MIP images confirms the above findings CTA HEAD FINDINGS Anterior circulation: Extensive atherosclerotic calcification in the cavernous carotid bilaterally causing moderate to severe stenosis bilaterally. Carotid in remains patent bilaterally. Anterior and middle cerebral arteries patent bilaterally without significant stenosis. Negative for aneurysm. Posterior circulation: Moderate to severe stenosis distal left vertebral artery and moderate stenosis distal right vertebral artery. PICA patent bilaterally. Basilar widely patent. Superior cerebellar and posterior cerebral arteries patent bilaterally. Severe stenosis proximal left posterior cerebral artery and mild stenosis distal left posterior cerebral artery. Mild stenosis proximal right posterior cerebral artery. No aneurysm in the posterior circulation Venous sinuses: Patent Anatomic variants: None Delayed phase: Normal enhancement on delayed imaging. Chronic microvascular ischemic change throughout the white matter, advanced for age. No intracranial hemorrhage or mass. Review of the MIP images confirms the above findings IMPRESSION: Mild atherosclerotic disease of the carotid bifurcation bilaterally without significant carotid stenosis. 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 Moderate to severe stenosis distal left vertebral artery and moderate stenosis distal right vertebral artery. Severe stenosis proximal left posterior cerebral artery with diffuse disease in the posterior cerebral artery bilaterally. Chronic microvascular ischemic change in the white matter, advanced for age Small bilateral effusions with bibasilar atelectasis. Electronically Signed   By: Marlan Palau M.D.   On: 04/11/2016 11:32   Ct Angio Neck W Or Wo Contrast  Result Date: 04/11/2016 CLINICAL DATA:  Stroke EXAM: CT ANGIOGRAPHY HEAD AND NECK TECHNIQUE:  Multidetector CT imaging of the head and neck was performed using the standard protocol during bolus administration of intravenous contrast. Multiplanar CT image reconstructions and MIPs were obtained to evaluate the vascular anatomy. Carotid stenosis measurements (when applicable) are obtained utilizing NASCET criteria, using the distal internal carotid diameter as the denominator. CONTRAST:  50 mL Isovue 370 IV COMPARISON:  MRI head 04/11/2016 FINDINGS: CTA NECK FINDINGS Aortic arch: Mild atherosclerotic calcification aortic arch without aneurysm or dissection. Proximal great vessels widely patent with mild atherosclerotic disease of the origin of the left subclavian artery. Right carotid system: Right common carotid artery widely patent without stenosis. Mild atherosclerotic disease at the right carotid bifurcation without significant stenosis  or dissection Left carotid system: Left common carotid artery widely patent with scattered areas of mild atherosclerotic calcification. Mild atherosclerotic disease in the carotid bulb on the left without significant carotid stenosis. No dissection. Vertebral arteries: Both vertebral arteries are equal in size and patent to the basilar. Proximal vertebral arteries widely patent. Atherosclerotic calcification and stenosis of the distal vertebral artery at the level of the dura. Moderate to severe stenosis on the left and moderate stenosis on the right. Skeleton: Negative Other neck: Negative for mass or adenopathy. Upper chest: Endotracheal tube just above the carina. No mass or adenopathy in the neck. NG tube in the esophagus. Small pleural effusions bibasilar bilaterally with bibasilar atelectasis. Review of the MIP images confirms the above findings CTA HEAD FINDINGS Anterior circulation: Extensive atherosclerotic calcification in the cavernous carotid bilaterally causing moderate to severe stenosis bilaterally. Carotid in remains patent bilaterally. Anterior and middle  cerebral arteries patent bilaterally without significant stenosis. Negative for aneurysm. Posterior circulation: Moderate to severe stenosis distal left vertebral artery and moderate stenosis distal right vertebral artery. PICA patent bilaterally. Basilar widely patent. Superior cerebellar and posterior cerebral arteries patent bilaterally. Severe stenosis proximal left posterior cerebral artery and mild stenosis distal left posterior cerebral artery. Mild stenosis proximal right posterior cerebral artery. No aneurysm in the posterior circulation Venous sinuses: Patent Anatomic variants: None Delayed phase: Normal enhancement on delayed imaging. Chronic microvascular ischemic change throughout the white matter, advanced for age. No intracranial hemorrhage or mass. Review of the MIP images confirms the above findings IMPRESSION: Mild atherosclerotic disease of the carotid bifurcation bilaterally without significant carotid stenosis. 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 Moderate to severe stenosis distal left vertebral artery and moderate stenosis distal right vertebral artery. Severe stenosis proximal left posterior cerebral artery with diffuse disease in the posterior cerebral artery bilaterally. Chronic microvascular ischemic change in the white matter, advanced for age Small bilateral effusions with bibasilar atelectasis. Electronically Signed   By: Marlan Palau M.D.   On: 04/11/2016 11:32   Mr Brain Wo Contrast  Result Date: 04/11/2016 CLINICAL DATA:  55 y/o F; left leg weakness and altered mental status follow by shortness of breath requiring intubation. EXAM: MRI HEAD WITHOUT CONTRAST TECHNIQUE: Multiplanar, multiecho pulse sequences of the brain and surrounding structures were obtained without intravenous contrast. COMPARISON:  04/10/2016 CT head FINDINGS: Brain: Focus of diffusion restriction within subcortical white matter in the left frontoparietal junction and small  punctate focus more posteriorly and parietal white matter. Additional punctate focus of diffusion restriction with mildly decreased ADC in the left forceps of the splenium of corpus callosum, slight mass effect, and intermediate T2 signal centrally or ADC is lowest. There is a background of extensive T2 FLAIR hyperintense signal abnormality throughout subcortical and periventricular white matter of the brain and within the pons compatible with advanced chronic microvascular ischemic changes. No abnormal susceptibility hypointensity to indicate intracranial hemorrhage. No focal mass effect. No extra-axial collection. No hydrocephalus. Mild parenchymal volume loss. Vascular: Normal flow voids. Skull and upper cervical spine: Normal marrow signal. Sinuses/Orbits: Mucosal thickening and fluid levels within the nasal passages and nasopharynx is likely due to intubation. No abnormal signal of mastoid air cells. T2 hyperintense lesion anterior to the left ear within the subcutaneous fat probably represents a dermal appendage cyst measuring up to 14 mm. Other: None. IMPRESSION: 1. Focus of diffusion restriction in subcortical white matter at the left frontal parietal junction consistent with acute/early subacute infarct. 2. Focus of diffusion restriction within  the left splenium of corpus callosum with different ADC value from the left frontal parietal lesion. This is probably an additional infarct but somewhat atypical. Follow-up MRI of the brain is recommended to ensure resolution of diffusion abnormality and exclude the possibility of underlying neoplasm. 3. Background of advanced chronic microvascular ischemic changes and mild parenchymal volume loss. These results will be called to the ordering clinician or representative by the Radiologist Assistant, and communication documented in the PACS or zVision Dashboard. Electronically Signed   By: Mitzi Hansen M.D.   On: 04/11/2016 05:04   Dg Chest Port 1  View  Result Date: 04/11/2016 CLINICAL DATA:  Acute respiratory failure. EXAM: PORTABLE CHEST 1 VIEW COMPARISON:  Radiograph of April 10, 2016. FINDINGS: Stable cardiomediastinal silhouette. Endotracheal and nasogastric tubes are unchanged in position. No pneumothorax or significant pleural effusion is noted. Mild bilateral diffuse interstitial densities are noted which are improved compared to prior exam, suggesting improved pulmonary edema. Bony thorax is unremarkable. IMPRESSION: Stable support apparatus.  Improved bilateral pulmonary edema. Electronically Signed   By: Lupita Raider, M.D.   On: 04/11/2016 09:12   Dg Chest Portable 1 View  Result Date: 04/10/2016 CLINICAL DATA:  Check endotracheal tube placement EXAM: PORTABLE CHEST 1 VIEW COMPARISON:  None. FINDINGS: Endotracheal tube is noted 4.4 cm above the carina. The cardiac shadow is at the upper limits of normal in size. Diffuse increased density is noted throughout both lungs likely related to diffuse pulmonary edema. No sizable effusions are seen. No bony abnormality is noted. Nasogastric catheter courses into the stomach. IMPRESSION: Endotracheal tube and nasogastric catheter in satisfactory position. Diffuse increased density throughout both lungs likely related to underlying pulmonary edema. Electronically Signed   By: Alcide Clever M.D.   On: 04/10/2016 16:19   Ct Head Code Stroke W/o Cm  Result Date: 04/10/2016 CLINICAL DATA:  Code stroke. Code stroke. Confusion. Left leg weakness. Diabetes. EXAM: CT HEAD WITHOUT CONTRAST TECHNIQUE: Contiguous axial images were obtained from the base of the skull through the vertex without intravenous contrast. COMPARISON:  None. FINDINGS: Brain: There are advanced chronic small vessel ischemic changes throughout the brain. No identifiable acute infarction, mass lesion, hemorrhage, hydrocephalus or extra-axial collection. Vascular: There is atherosclerotic calcification of the major vessels at the  base of the brain. Skull: Normal Sinuses/Orbits: Clear/normal Other: None significant ASPECTS (Alberta Stroke Program Early CT Score) - Ganglionic level infarction (caudate, lentiform nuclei, internal capsule, insula, M1-M3 cortex): 7 - Supraganglionic infarction (M4-M6 cortex): 3 Total score (0-10 with 10 being normal): 10 IMPRESSION: 1. Advanced chronic small vessel ischemic changes throughout the brain. No sign of acute cortical or large vessel infarction. There is atherosclerotic calcification of the major vessels at the base of the brain. 2. ASPECTS is 10. These results were called by telephone at the time of interpretation on 04/10/2016 at 3:47 pm to Dr. Amada Jupiter, who verbally acknowledged these results. Electronically Signed   By: Paulina Fusi M.D.   On: 04/10/2016 15:48    Patient Profile     55 year old female with PMH as below, which is significant for HTN and DM. 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 While at work 10/11 around 1400 she was noted to be dragging her left foot with AMS. She was transported to [facility] ED as a code stroke and underwent CT scan demonstrating no hemorrhage or clear evidence of acute infarct. Shortly after returning from CT she developed shortness of breath and desaturation and was  intubated. ST changes noted during this episode and she was taken to cath lab where diffuse CAD was discovered an no intervention was done. After the cath she was sent to ICU for recovery and further evaluation. PCCM managing Vent & other medical issues. Intubated pre-cath for likely Flash Pulmonary Edema  Neurology has been consulted given MRI Brain findings.  Assessment & Plan    Principal Problem:   Acute ischemic stroke [facility])- seems to have had a good initial recovery MRI  left frontal parietal infarct and separate left splenium of corpus callosum infarct CTA H&N  severe multifocal intracranial stenosis including bilateral PCAs, VAs, cavernous ICAs.  I suspected this  was originally presenting problem & NOT a procedure related event.   Neurology has been consulted & signed off. -- Unfortunately, with her chest pain and dyspnea, a cardiac etiology took precedence in the absence of a true bleed.   Now allowing for permissive hypertension and trying to avoid aggressively reducing her blood pressure. - recommendation is "OK for BP of 180/1055 -- but gradually normalize in 5-7 days"     ~ST elevation myocardial infarction - Cath with  Multiple vessel coronary artery disease but no culprit lesion; I personally reviewed the films. Troponin levels are flat @ ~0.16-0.17 --NOT A STEMI  Extensively calcified diffusely diseased vessels with no clear PCI or CABG targets. -- Not currently having chest pain despite blood pressure 179/96. &  It is unclear as to whether not this is true ACS/MI versus diffuse demand ischemia in setting of hypertensive emergency. EKG does not show signs of evolving inferolateral MI   My suspicion is that with hypertensive emergency, she did get ischemic from microvascular standpoint leading to acute pulmonary edema and acute respiratory failure. -- Interestingly, her LVEDP was relatively normal once her blood pressure stabilized in the Cath Lab.  2-D echocardiogram ordered to better assess EF  We'll follow cardiac enzymes to determine if myocardial injury will do occur.  Medical Management is our only option: On aspirin, high-dose statin and carvedilol. Limited as far as titration of medications secondary to the need of permissive hypertension for stroke.   ? Cardiomyopathy EF ~35-45% on LV Gram - will order TTE today --> will need ARB added -- start Losartan 25 mg tomorrow      Acute respiratory failure with hypoxia (HCC) 2/2  -- Hypertensive emergency & Acute pulmonary edema (HCC)  Initially started on nitroglycerin drip and the cardiac finger, both were stopped last night and clear reasons-  blood pressure ranges 150-1 70 mmHg.  Unfortunately, with desire for permissive hypertension in the setting of recent stroke, we are now having to allow her pressures to be high - but will "gradually normalize" over next few days.  She has carvedilol 6.25 mg ordered - would like to see how she does with couple dose of carvedilol;   Add ARB tomorrow & then titrate up BB dose.  Unfortunately we do not yet have her home medication list - apparently she was not very adherent to Rx list  Chest x-ray yesterday did show some pulmonary vascular congestion -- will probably need diuretic - consider Lasix tomorrow & possible adding Spironolactone as OP.    Diabetes mellitus type 2, insulin dependent (HCC)  Off Insulin gtt - on SSI & Lantus 10 mg -- have asked [facility] to take over as primary Service upon transfer to Stepdown later today.   The patient is critically ill with multiple organ systems failure and requires high complexity decision  making for assessment and support, frequent evaluation and titration of therapies, application of advanced monitoring technologies and extensive interpretation of multiple databases.   Critical Care Time devoted to patient care services described in this note is  30  Minutes. This time reflects time of care of this signee. This critical care time does not reflect procedure time, or teaching time or supervisory time of PA/NP/Med student/Med Resident etc but could involve care discussion time.     Signed, Bryan Lemmaavid Anjalina Bergevin, MD Bryan Lemmaavid Donne Baley, M.D., M.S. Interventional Cardiologist   Pager # [phone] Phone # [phone] [address] [place], [place] [id number]  04/12/2016, 10:17 AM

## 2016-04-12 NOTE — Progress Notes (Signed)
MEDICATION RELATED NOTE   Pharmacy Re:  Home Meds  Medications:  Prescriptions Prior to Admission  Medication Sig Dispense Refill Last Dose  . CALCIUM-MAGNESIUM-ZINC PO Take 1 tablet by mouth daily.   Unknown at Unknown  . fluconazole (DIFLUCAN) 100 MG tablet Take 1 tablet (100 mg total) by mouth daily. (Patient not taking: Reported on 04/11/2016) 5 tablet 0 Completed Course at Unknown time  . insulin aspart (NOVOLOG) 100 UNIT/ML injection Inject 5-10 Units into the skin 3 (three) times daily as needed for high blood sugar.    02/28/2016 at Unknown time  . Lactobacillus (ACIDOPHILUS PO) Take 1-2 tablets by mouth daily.   Unknown at Unknown  . Magnesium 250 MG TABS Take 250 mg by mouth daily.    Unknown at Unknown  . oxyCODONE (OXY IR/ROXICODONE) 5 MG immediate release tablet Take 1-2 tablets (5-10 mg total) by mouth every 6 (six) hours as needed for severe pain. (Patient not taking: Reported on 04/11/2016) 30 tablet 0 Completed Course at Unknown time  . oxyCODONE-acetaminophen (PERCOCET/ROXICET) 5-325 MG tablet Take 1 tablet by mouth every 6 (six) hours as needed for moderate pain or severe pain. (Patient not taking: Reported on 04/11/2016) 30 tablet 0 Completed Course at Unknown time    Assessment: We have attempted to clarify home meds with the pharmacy.  No prescription refill found that is current.  Her last refill with our outpatient pharmacy was for  Novolog 70/30 25 units SQ BID was [facility]. [facility] last filled antibiotic and pain medications 09/02/2017but they state those should be completed by now.  Plan:  I have marked this record as complete with medications from [facility] as "to be removed" since these were a one time fill.  Please confirm insulin according to what you feel meets patient needs at time of discharge.  Nadara MustardNita Khamani Fairley, PharmD., MS Clinical Pharmacist Pager:  [phone] Thank you for allowing pharmacy to be part of this patients care  team. 04/12/2016,8:34 AM

## 2016-04-12 NOTE — Progress Notes (Signed)
Inpatient Diabetes Program Recommendations  AACE/ADA: New Consensus Statement on Inpatient Glycemic Control (2015)  Target Ranges:  Prepandial:   less than 140 mg/dL      Peak postprandial:   less than 180 mg/dL (1-2 hours)      Critically ill patients:  140 - 180 mg/dL   Lab Results  Component Value Date   GLUCAP 177 (H) 04/12/2016   HGBA1C 12.7 (H) 04/10/2016    Review of Glycemic Control Results for ZEAH, GERMANO (MRN [id number]) as of 04/12/2016 12:45  Ref. Range 04/12/2016 07:45 04/12/2016 12:02  Glucose-Capillary Latest Ref Range: 65 - 99 mg/dL 164 (H) 177 (H)   Diabetes history: DM2 Outpatient Diabetes medications: Novolog 5 units tid with meals Current orders for Inpatient glycemic control: Lantus 10 units daily + Novolog correction 0-15 units tid + 0-5 units hs  Inpatient Diabetes Program Recommendations:  Spoke with patient @ bedside @ length along with daughter & son. Patient states she quit taking her Levemir due to not having a prescription. Reviewed with patient need for medication compliance and followup with physician caring for diabetes. Gave patient a lantus copay card to use with her insurance= 0 copay.  Spoke with pt about new diagnosis. Discussed A1C results with them and explained what an A1C is, basic pathophysiology of DM Type 2, basic home care, basic diabetes diet nutrition principles, importance of checking CBGs and maintaining good CBG control to prevent long-term and short-term complications. Reviewed signs and symptoms of hyperglycemia and hypoglycemia and how to treat hypoglycemia at home. Also reviewed blood sugar goals at home.  RNs to provide ongoing basic DM education at bedside with this patient. Have ordered educational booklet, insulin starter kit, and DM videos. Patient states willingness to take basal insulin along with short acting.  Please consider: Novolog 3 units meal coverage tid D/C needs: Prescription for meter and strips  [phone] Insulin pen needles                        [phone]  Thank you, Nani Gasser. Aydn Ferrara, RN, MSN, CDE Inpatient Glycemic Control Team Team Pager [phone] (8am-5pm) 04/12/2016 12:57 PM

## 2016-04-13 ENCOUNTER — Encounter (HOSPITAL_COMMUNITY): Payer: Self-pay | Admitting: *Deleted

## 2016-04-13 ENCOUNTER — Inpatient Hospital Stay (HOSPITAL_COMMUNITY): Payer: 59

## 2016-04-13 DIAGNOSIS — I639 Cerebral infarction, unspecified: Secondary | ICD-10-CM

## 2016-04-13 DIAGNOSIS — I2510 Atherosclerotic heart disease of native coronary artery without angina pectoris: Secondary | ICD-10-CM

## 2016-04-13 LAB — BASIC METABOLIC PANEL
Anion gap: 9 (ref 5–15)
BUN: 12 mg/dL (ref 6–20)
CHLORIDE: 106 mmol/L (ref 101–111)
CO2: 23 mmol/L (ref 22–32)
Calcium: 8.7 mg/dL — ABNORMAL LOW (ref 8.9–10.3)
Creatinine, Ser: 0.63 mg/dL (ref 0.44–1.00)
GFR calc Af Amer: >60 mL/min (ref >60)
GFR calc non Af Amer: >60 mL/min (ref >60)
Glucose, Bld: 172 mg/dL — ABNORMAL HIGH (ref 65–99)
POTASSIUM: 3.7 mmol/L (ref 3.5–5.1)
SODIUM: 138 mmol/L (ref 135–145)

## 2016-04-13 LAB — GLUCOSE, CAPILLARY
GLUCOSE-CAPILLARY: 179 mg/dL — AB (ref 65–99)
GLUCOSE-CAPILLARY: 168 mg/dL — AB (ref 65–99)
GLUCOSE-CAPILLARY: 185 mg/dL — AB (ref 65–99)
Glucose-Capillary: 150 mg/dL — ABNORMAL HIGH (ref 65–99)

## 2016-04-13 LAB — ECHOCARDIOGRAM COMPLETE
Height: 64 in
Weight: 2582.4 oz

## 2016-04-13 LAB — MAGNESIUM: MAGNESIUM: 1.9 mg/dL (ref 1.7–2.4)

## 2016-04-13 NOTE — Progress Notes (Addendum)
Patient ID: Rebecca Wagner, female   DOB: 1960/08/25, 55 y.o.   MRN: [id number]  PROGRESS NOTE    Rebecca Wagner  WUJ:[id number] DOB: 13-Dec-1960 DOA: 04/10/2016  PCP: [name], MD   Brief Narrative:  55 year old female with past medical history of hypertension, DM who presented to [facility] 10/11 after she was found to have left foot drop while at work. 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 She was transported to [facility] ED as a code stroke and underwent CT scan demonstrating no hemorrhage or clear evidence of acute infarct. Shortly after returning from CT she developed shortness of breath and desaturation and was subsequently intubated. ST changes were noted during this episode and she was taken to cath lab where diffuse CAD was discovered and no intervention was done. After the cath she was sent to ICU for recovery and further evaluation.  SIGNIFICANT EVENTS: 10/11 > AMS at work, code stroke, ST changes to cath lab, no intervention. Out to ICU on vent.  10/14 - [facility] assumed care   LINES/TUBES: ETT 10/11>>>10/12  Assessment & Plan:   Principal Problem: Acute respiratory failure with hypoxia (HCC) - Due to acute CVA - Extubated 10/12 - Respiratory status remains stable   Active Problems: Left Frontal-Parietal CVA - Aspirin daily - MRI brain - Focus of diffusion restriction in subcortical white matter at the left frontal parietal junction consistent with acute/early subacute infarct. Focus of diffusion restriction within the left splenium of corpus callosum with different ADC value from the left frontal parietal lesion. This is probably an additional infarct but somewhat atypical. Follow-up MRI of the brain is recommended to ensure resolution of diffusion abnormality and exclude the possibility of underlying neoplasm. Background of advanced chronic microvascular ischemic changes and mild parenchymal volume loss. - CT angio head and neck - Mild atherosclerotic disease of the  carotid bifurcation bilaterally without significant carotid stenosis. 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 Moderate to severe stenosis distal left vertebral artery and moderate stenosis distal right vertebral artery. Severe stenosis proximal left posterior cerebral artery with diffuse disease in the posterior cerebral artery bilaterally. - 2D ECHO - EF 45%; Regional wall motion abnormality: Akinesis of the apical inferior and apical myocardium; severe hypokinesis of the basal inferior, apical septal, and apical lateral myocardium; moderate hypokinesis of the mid inferoseptal, mid inferior, and mid anterolateral myocardium; mild hypokinesis of the basal anterolateral myocardium. Mitral valve: Mildly calcified annulus. There was moderate regurgitation. - HgbA1c 12.2 - LDL 165; goal should be less than 100; Pt on Lipitor 80 mg at bedtime  - Diet: Carb modified  - Therapy: PT/OT - No PT follow up required  - Appreciate neurology following  Uncontrolled diabetes mellitus with peripheral circulatory complications with long term insulin use - Continue Lantus 10 units daily and SSI - A1c 12.2 indicating poor glycemic control   Hypertensive emergency - Pt on coreg 6.25 mg PO BID and losartan 25 mg daily   ST elevation - in setting of CVA & HTN Emergency - NOT MI / Multiple vessel coronary artery disease / Troponin elevation  - Troponin level 0.17 --> 0.16 - S/p cardiac cath - 2 D ECHO on this admission with EF 45% - Cardiology is following   Hypokalemia - Repeat potassium WNL  Anemia of chronic disease - Hgb stable     DVT prophylaxis: Lovenox subQ Code Status: full code  Family Communication: no family at the bedside  Disposition Plan: home Monday  Consultants:   PCCM initially primary team, [facility] assumed care 10/14  Cardiology   SLP  DM  Neurology   Antimicrobials:   None    Subjective: No overnight events.   Objective: Vitals:   04/13/16  0550 04/13/16 0911 04/13/16 1332 04/13/16 1711  BP: (!) 163/83 (!) 144/80 130/71 138/83  Pulse: 89 84 81 87  Resp: 18 18 18 18   Temp: 99 F (37.2 C) 98.6 F (37 C) 98.6 F (37 C) 99.5 F (37.5 C)  TempSrc: Oral Oral Oral Oral  SpO2: 100% 100% 100% 99%  Weight:      Height:       No intake or output data in the 24 hours ending 04/13/16 2042 Filed Weights   04/10/16 1530 04/11/16 0451 04/12/16 1922  Weight: 75.5 kg (166 lb 7.2 oz) 71.7 kg (158 lb 1.1 oz) 73.2 kg (161 lb 6.4 oz)    Examination:  General exam: Appears calm and comfortable  Respiratory system: Clear to auscultation. Respiratory effort normal. Cardiovascular system: S1 & S2 heard, RRR. No JVD, murmurs, rubs, gallops or clicks. No pedal edema. Gastrointestinal system: Abdomen is nondistended, soft and nontender. No organomegaly or masses felt. Normal bowel sounds heard. Central nervous system: Alert and oriented. No focal neurological deficits. Extremities: Symmetric 5 x 5 power. Skin: No rashes, lesions or ulcers Psychiatry: Judgement and insight appear normal. Mood & affect appropriate.   Data Reviewed: I have personally reviewed following labs and imaging studies  CBC:  Recent Labs Lab 04/10/16 1525 04/10/16 1534 04/11/16 0610 04/12/16 0244  WBC 7.7  --  13.9* 9.0  NEUTROABS 4.5  --  11.0*  --   HGB 11.9* 12.9 11.8* 10.0*  HCT 36.0 38.0 36.1 31.1*  MCV 86.3  --  87.2 86.9  PLT 446*  --  425* 382   Basic Metabolic Panel:  Recent Labs Lab 04/10/16 1525 04/10/16 1534 04/11/16 0610 04/12/16 0244 04/13/16 0324  NA 135 137 139 137 138  K 3.5 3.5 3.4* 3.1* 3.7  CL 103 103 105 105 106  CO2 20*  --  26 24 23   GLUCOSE 220* 222* 91 156* 172*  BUN 20 22* 18 11 12   CREATININE 0.73 0.60 0.74 0.58 0.63  CALCIUM 9.3  --  9.1 8.5* 8.7*  MG  --   --   --  1.6* 1.9  PHOS  --   --   --  2.8  --    GFR: Estimated Creatinine Clearance: 77 mL/min (by C-G formula based on SCr of 0.63 mg/dL). Liver Function  Tests:  Recent Labs Lab 04/10/16 1525  AST 19  ALT 13*  ALKPHOS 88  BILITOT 0.6  PROT 7.4  ALBUMIN 3.3*   No results for input(s): LIPASE, AMYLASE in the last 168 hours. No results for input(s): AMMONIA in the last 168 hours. Coagulation Profile:  Recent Labs Lab 04/10/16 1525  INR 0.99   Cardiac Enzymes:  Recent Labs Lab 04/11/16 0820 04/11/16 1430  TROPONINI 0.17* 0.16*   BNP (last 3 results) No results for input(s): PROBNP in the last 8760 hours. HbA1C: No results for input(s): HGBA1C in the last 72 hours. CBG:  Recent Labs Lab 04/12/16 1654 04/12/16 2110 04/13/16 0625 04/13/16 1107 04/13/16 1607  GLUCAP 165* 148* 179* 168* 185*   Lipid Profile:  Recent Labs  04/11/16 0610  CHOL 257*  HDL 65  LDLCALC 165*  TRIG 134  CHOLHDL 4.0   Thyroid Function Tests:  Recent Labs  04/11/16 [id number]  TSH 0.630   Anemia Panel:  Recent Labs  04/11/16 0918  VITAMINB12 502   Urine analysis:    Component Value Date/Time   COLORURINE YELLOW 04/10/2016 1824   APPEARANCEUR CLEAR 04/10/2016 1824   LABSPEC 1.020 04/10/2016 1824   PHURINE 5.5 04/10/2016 1824   GLUCOSEU 250 (A) 04/10/2016 1824   HGBUR NEGATIVE 04/10/2016 1824   BILIRUBINUR NEGATIVE 04/10/2016 1824   BILIRUBINUR large (A) 02/28/2016 1820   KETONESUR NEGATIVE 04/10/2016 1824   PROTEINUR NEGATIVE 04/10/2016 1824   UROBILINOGEN 1.0 02/28/2016 1820   NITRITE NEGATIVE 04/10/2016 1824   LEUKOCYTESUR NEGATIVE 04/10/2016 1824   Sepsis Labs: @LABRCNTIP (procalcitonin:4,lacticidven:4)    Recent Results (from the past 240 hour(s))  MRSA PCR Screening     Status: None   Collection Time: 04/10/16  5:36 PM  Result Value Ref Range Status   MRSA by PCR NEGATIVE NEGATIVE Final      Radiology Studies: Ct Angio Head W Or Wo Contrast Result Date: 04/11/2016 Mild atherosclerotic disease of the carotid bifurcation bilaterally without significant carotid stenosis. Moderate to severe stenosis of the  cavernous carotid bilaterally. Moderate to severe stenosis distal left vertebral artery and moderate stenosis distal right vertebral artery. Severe stenosis proximal left posterior cerebral artery with diffuse disease in the posterior cerebral artery bilaterally. Chronic microvascular ischemic change in the white matter, advanced for age Small bilateral effusions with bibasilar atelectasis. Electronically Signed   By: Marlan Palau M.D.   On: 04/11/2016 11:32   Ct Angio Neck W Or Wo Contrast Result Date: 04/11/2016 Mild atherosclerotic disease of the carotid bifurcation bilaterally without significant carotid stenosis. 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 Moderate to severe stenosis distal left vertebral artery and moderate stenosis distal right vertebral artery. Severe stenosis proximal left posterior cerebral artery with diffuse disease in the posterior cerebral artery bilaterally. Chronic microvascular ischemic change in the white matter, advanced for age Small bilateral effusions with bibasilar atelectasis. Electronically Signed   By: Marlan Palau M.D.   On: 04/11/2016 11:32   Mr Brain Wo Contrast Result Date: 04/11/2016 1. Focus of diffusion restriction in subcortical white matter at the left frontal parietal junction consistent with acute/early subacute infarct. 2. Focus of diffusion restriction within the left splenium of corpus callosum with different ADC value from the left frontal parietal lesion. This is probably an additional infarct but somewhat atypical. Follow-up MRI of the brain is recommended to ensure resolution of diffusion abnormality and exclude the possibility of underlying neoplasm. 3. Background of advanced chronic microvascular ischemic changes and mild parenchymal volume loss. These results will be called to the ordering clinician or representative by the Radiologist Assistant, and communication documented in the PACS or zVision Dashboard.  Electronically Signed   By: Mitzi Hansen M.D.   On: 04/11/2016 05:04   Dg Chest Port 1 View Result Date: 04/11/2016 Stable support apparatus.  Improved bilateral pulmonary edema. Electronically Signed   By: Lupita Raider, M.D.   On: 04/11/2016 09:12   Dg Chest Portable 1 View Result Date: 04/10/2016 Endotracheal tube and nasogastric catheter in satisfactory position. Diffuse increased density throughout both lungs likely related to underlying pulmonary edema. Electronically Signed   By: Alcide Clever M.D.   On: 04/10/2016 16:19   Ct Head Code Stroke W/o Cm Result Date: 04/10/2016  1. Advanced chronic small vessel ischemic changes throughout the brain. No sign of acute cortical or large vessel infarction. There is atherosclerotic calcification of the major vessels at the base of  the brain. 2. ASPECTS is 10. These results were called by telephone at the time of interpretation on 04/10/2016 at 3:47 pm to Dr. Amada Jupiter, who verbally acknowledged these results. Electronically Signed   By: Paulina Fusi M.D.   On: 04/10/2016 15:48      Scheduled Meds: . aspirin  81 mg Oral Daily  . atorvastatin  80 mg Oral q1800  . carvedilol  6.25 mg Oral BID WC  . clopidogrel  75 mg Oral Daily  . enoxaparin (LOVENOX) injection  40 mg Subcutaneous Q24H  . famotidine  20 mg Oral BID  . insulin aspart  0-15 Units Subcutaneous TID WC  . insulin aspart  0-5 Units Subcutaneous QHS  . insulin glargine  10 Units Subcutaneous Daily  . losartan  25 mg Oral Daily  . potassium chloride  40 mEq Oral Daily  . sodium chloride flush  3 mL Intravenous Q12H   Continuous Infusions:    LOS: 3 days    Time spent: 25 minutes  Greater than 50% of the time spent on counseling and coordinating the care.   Manson Passey, MD [facility] Pager [phone]  If 7PM-7AM, please contact night-coverage [email or web address] Password [facility] 04/13/2016, 8:42 PM

## 2016-04-13 NOTE — Progress Notes (Signed)
Echocardiogram 2D Echocardiogram has been performed.  Marisue Humblelexis N Kadeen Sroka 04/13/2016, 11:57 AM

## 2016-04-13 NOTE — Progress Notes (Signed)
Arrived from 651m at 1922. Alert and oriented x4. Denies any pain. Tele applied. Safety measure in placed.

## 2016-04-13 NOTE — Progress Notes (Signed)
SUBJECTIVE: The patient is doing well today.  At this time, she denies chest pain, shortness of breath, or any new concerns.  Marland Kitchen. aspirin  81 mg Oral Daily  . atorvastatin  80 mg Oral q1800  . carvedilol  6.25 mg Oral BID WC  . clopidogrel  75 mg Oral Daily  . enoxaparin (LOVENOX) injection  40 mg Subcutaneous Q24H  . famotidine  20 mg Oral BID  . insulin aspart  0-15 Units Subcutaneous TID WC  . insulin aspart  0-5 Units Subcutaneous QHS  . insulin glargine  10 Units Subcutaneous Daily  . losartan  25 mg Oral Daily  . potassium chloride  40 mEq Oral Daily  . sodium chloride flush  3 mL Intravenous Q12H      OBJECTIVE: Physical Exam: Vitals:   04/13/16 0100 04/13/16 0550 04/13/16 0911 04/13/16 1332  BP: (!) 156/75 (!) 163/83 (!) 144/80 130/71  Pulse: 86 89 84 81  Resp: 20 18 18 18   Temp: 98.8 F (37.1 C) 99 F (37.2 C) 98.6 F (37 C) 98.6 F (37 C)  TempSrc: Oral Oral Oral Oral  SpO2: 96% 100% 100% 100%  Weight:      Height:        Intake/Output Summary (Last 24 hours) at 04/13/16 1416 Last data filed at 04/12/16 1700  Gross per 24 hour  Intake              400 ml  Output              400 ml  Net                0 ml    Telemetry reveals sinus rhythm  GEN- The patient is well appearing, alert and oriented x 3 today.   Head- normocephalic, atraumatic Eyes-  Sclera clear, conjunctiva pink Ears- hearing intact Oropharynx- clear Neck- supple,   Lungs- Clear to ausculation bilaterally, normal work of breathing Heart- Regular rate and rhythm, no murmurs, rubs or gallops, PMI not laterally displaced GI- soft, NT, ND, + BS Extremities- no clubbing, cyanosis, or edema Skin- no rash or lesion Psych- euthymic mood, full affect Neuro- strength and sensation are intact  LABS: Basic Metabolic Panel:  Recent Labs  78/29/5610/13/17 0244 04/13/16 0324  NA 137 138  K 3.1* 3.7  CL 105 106  CO2 24 23  GLUCOSE 156* 172*  BUN 11 12  CREATININE 0.58 0.63  CALCIUM 8.5* 8.7*   MG 1.6* 1.9  PHOS 2.8  --    Liver Function Tests:  Recent Labs  04/10/16 1525  AST 19  ALT 13*  ALKPHOS 88  BILITOT 0.6  PROT 7.4  ALBUMIN 3.3*   No results for input(s): LIPASE, AMYLASE in the last 72 hours. CBC:  Recent Labs  04/10/16 1525  04/11/16 0610 04/12/16 0244  WBC 7.7  --  13.9* 9.0  NEUTROABS 4.5  --  11.0*  --   HGB 11.9*  < > 11.8* 10.0*  HCT 36.0  < > 36.1 31.1*  MCV 86.3  --  87.2 86.9  PLT 446*  --  425* 382  < > = values in this interval not displayed. Cardiac Enzymes:  Recent Labs  04/11/16 0820 04/11/16 1430  TROPONINI 0.17* 0.16*   BNP: Invalid input(s): POCBNP D-Dimer: No results for input(s): DDIMER in the last 72 hours. Hemoglobin A1C: No results for input(s): HGBA1C in the last 72 hours. Fasting Lipid Panel:  Recent Labs  04/11/16 0610  CHOL 257*  HDL 65  LDLCALC 165*  TRIG 134  CHOLHDL 4.0   Thyroid Function Tests:  Recent Labs  04/11/16 0918  TSH 0.630    ASSESSMENT AND PLAN:   1. Stroke By clinical presentation, seems to be her primary issue.  I think she will need very close outpatient neuro involvement. Continue ASA, plavix and statin  2. 3V CAD/ ischemic CM Did not have STEMI this admit She does have advanced nonrevascularizable CAD.  In the setting of hypertensive urgency, decompensated post stroke. Continue ASA, plavix, statin, coreg, losartan Echo pending Will need to follow-up with Dr Kirke Corin or Herbie Baltimore after discharge  3. Hypertensive urgency Improved Losartan added Titrate coreg tomorrow  No further inpatient CV testing planned Could likely go home in next 1-2 days. Will arrange outpatient general cardiology follow-up  Cardiology to see as needed over the weekend.  Hillis Range, MD 04/13/2016 2:16 PM

## 2016-04-14 LAB — GLUCOSE, CAPILLARY
GLUCOSE-CAPILLARY: 156 mg/dL — AB (ref 65–99)
GLUCOSE-CAPILLARY: 133 mg/dL — AB (ref 65–99)
Glucose-Capillary: 217 mg/dL — ABNORMAL HIGH (ref 65–99)

## 2016-04-14 NOTE — Progress Notes (Signed)
Patient ID: KATTIE SANTOYO, female   DOB: [birth year], 56 y.o.   MRN: [id number]  PROGRESS NOTE    CLAIRA JETER  NFA:[id number] DOB: [birth year] DOA: 04/10/2016  PCP: [name], MD   Brief Narrative:  55 year old female with past medical history of hypertension, DM who presented to [facility] 10/11 after she was found to have left foot drop while at work. 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 She was transported to [facility] ED as a code stroke and underwent CT scan demonstrating no hemorrhage or clear evidence of acute infarct. Shortly after returning from CT she developed shortness of breath and desaturation and was subsequently intubated. ST changes were noted during this episode and she was taken to cath lab where diffuse CAD was discovered and no intervention was done. After the cath she was sent to ICU for recovery and further evaluation.  SIGNIFICANT EVENTS: 10/11 > AMS at work, code stroke, ST changes to cath lab, no intervention. Out to ICU on vent.  10/14 - [facility] assumed care   LINES/TUBES: ETT 10/11>>>10/12  Assessment & Plan:   Principal Problem: Acute respiratory failure with hypoxia (HCC) - Due to acute CVA - Extubated 10/12 - Stable resp status   Active Problems: Left Frontal-Parietal CVA - Aspirin daily - MRI brain - Focus of diffusion restriction in subcortical white matter at the left frontal parietal junction consistent with acute/early subacute infarct. Focus of diffusion restriction within the left splenium of corpus callosum with different ADC value from the left frontal parietal lesion. This is probably an additional infarct but somewhat atypical. Follow-up MRI of the brain is recommended to ensure resolution of diffusion abnormality and exclude the possibility of underlying neoplasm. Background of advanced chronic microvascular ischemic changes and mild parenchymal volume loss. - CT angio head and neck - Mild atherosclerotic disease of the carotid bifurcation  bilaterally without significant carotid stenosis. 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 Moderate to severe stenosis distal left vertebral artery and moderate stenosis distal right vertebral artery. Severe stenosis proximal left posterior cerebral artery with diffuse disease in the posterior cerebral artery bilaterally. - 2D ECHO - EF 45%; Regional wall motion abnormality: Akinesis of the apical inferior and apical myocardium; severe hypokinesis of the basal inferior, apical septal, and apical lateral myocardium; moderate hypokinesis of the mid inferoseptal, mid inferior, and mid anterolateral myocardium; mild hypokinesis of the basal anterolateral myocardium. Mitral valve: Mildly calcified annulus. There was moderate regurgitation. - HgbA1c 12.2 - LDL 165; goal should be less than 100; Pt on Lipitor 80 mg at bedtime  - Diet: Carb modified  - Therapy: PT/OT - No PT follow up required  - Appreciate neurology following  Uncontrolled diabetes mellitus with peripheral circulatory complications with long term insulin use - Continue Lantus 10 units daily and SSI - A1c 12.2   Hypertensive emergency - Continue coreg 6.25 mg PO BID and losartan 25 mg daily   ST elevation - in setting of CVA & HTN Emergency - NOT MI / Multiple vessel coronary artery disease / Troponin elevation  - Troponin level 0.17 --> 0.16 - S/p cardiac cath - 2 D ECHO on this admission with EF 45% - No chest pain   Hypokalemia - Repeat potassium WNL  Anemia of chronic disease - Hgb stable     DVT prophylaxis: Lovenox subQ  Code Status: full code  Family Communication: no family at the bedside this am Disposition Plan: home tomorrow    Consultants:  PCCM initially primary team, [facility] assumed care 10/14  Cardiology   SLP  DM  Neurology   Antimicrobials:   None    Subjective: No overnight events.   Objective: Vitals:   04/13/16 2230 04/14/16 0123 04/14/16 0524 04/14/16 0902    BP: (!) 142/74 (!) 143/61 (!) 164/97 (!) 150/81  Pulse: 100 84 96 94  Resp: 18 18 18 18   Temp: 99 F (37.2 C) 98.5 F (36.9 C) 98.6 F (37 C) 98 F (36.7 C)  TempSrc: Oral Oral Oral Oral  SpO2: 96% 100% 94% 100%  Weight:      Height:       No intake or output data in the 24 hours ending 04/14/16 1131 Filed Weights   04/10/16 1530 04/11/16 0451 04/12/16 1922  Weight: 75.5 kg (166 lb 7.2 oz) 71.7 kg (158 lb 1.1 oz) 73.2 kg (161 lb 6.4 oz)    Examination:  General exam: Appears calm and comfortable, no distress  Respiratory system: No wheezing, no rhonchi  Cardiovascular system: S1 & S2 heard, Rate controlled  Gastrointestinal system: (+) BS, non tender  Central nervous system: No focal neurological deficits. Extremities: No edema, palpable pulses Skin: warm, dry  Psychiatry: Normal mood and behavior   Data Reviewed: I have personally reviewed following labs and imaging studies  CBC:  Recent Labs Lab 04/10/16 1525 04/10/16 1534 04/11/16 0610 04/12/16 0244  WBC 7.7  --  13.9* 9.0  NEUTROABS 4.5  --  11.0*  --   HGB 11.9* 12.9 11.8* 10.0*  HCT 36.0 38.0 36.1 31.1*  MCV 86.3  --  87.2 86.9  PLT 446*  --  425* 382   Basic Metabolic Panel:  Recent Labs Lab 04/10/16 1525 04/10/16 1534 04/11/16 0610 04/12/16 0244 04/13/16 0324  NA 135 137 139 137 138  K 3.5 3.5 3.4* 3.1* 3.7  CL 103 103 105 105 106  CO2 20*  --  26 24 23   GLUCOSE 220* 222* 91 156* 172*  BUN 20 22* 18 11 12   CREATININE 0.73 0.60 0.74 0.58 0.63  CALCIUM 9.3  --  9.1 8.5* 8.7*  MG  --   --   --  1.6* 1.9  PHOS  --   --   --  2.8  --    GFR: Estimated Creatinine Clearance: 77 mL/min (by C-G formula based on SCr of 0.63 mg/dL). Liver Function Tests:  Recent Labs Lab 04/10/16 1525  AST 19  ALT 13*  ALKPHOS 88  BILITOT 0.6  PROT 7.4  ALBUMIN 3.3*   No results for input(s): LIPASE, AMYLASE in the last 168 hours. No results for input(s): AMMONIA in the last 168 hours. Coagulation  Profile:  Recent Labs Lab 04/10/16 1525  INR 0.99   Cardiac Enzymes:  Recent Labs Lab 04/11/16 0820 04/11/16 1430  TROPONINI 0.17* 0.16*   BNP (last 3 results) No results for input(s): PROBNP in the last 8760 hours. HbA1C: No results for input(s): HGBA1C in the last 72 hours. CBG:  Recent Labs Lab 04/13/16 1107 04/13/16 1607 04/13/16 2254 04/14/16 0645 04/14/16 1102  GLUCAP 168* 185* 150* 156* 217*   Lipid Profile: No results for input(s): CHOL, HDL, LDLCALC, TRIG, CHOLHDL, LDLDIRECT in the last 72 hours. Thyroid Function Tests: No results for input(s): TSH, T4TOTAL, FREET4, T3FREE, THYROIDAB in the last 72 hours. Anemia Panel: No results for input(s): VITAMINB12, FOLATE, FERRITIN, TIBC, IRON, RETICCTPCT in the last 72 hours. Urine analysis:    Component Value Date/Time   COLORURINE  YELLOW 04/10/2016 1824   APPEARANCEUR CLEAR 04/10/2016 1824   LABSPEC 1.020 04/10/2016 1824   PHURINE 5.5 04/10/2016 1824   GLUCOSEU 250 (A) 04/10/2016 1824   HGBUR NEGATIVE 04/10/2016 1824   BILIRUBINUR NEGATIVE 04/10/2016 1824   BILIRUBINUR large (A) 02/28/2016 1820   KETONESUR NEGATIVE 04/10/2016 1824   PROTEINUR NEGATIVE 04/10/2016 1824   UROBILINOGEN 1.0 02/28/2016 1820   NITRITE NEGATIVE 04/10/2016 1824   LEUKOCYTESUR NEGATIVE 04/10/2016 1824   Sepsis Labs: @LABRCNTIP (procalcitonin:4,lacticidven:4)    Recent Results (from the past 240 hour(s))  MRSA PCR Screening     Status: None   Collection Time: 04/10/16  5:36 PM  Result Value Ref Range Status   MRSA by PCR NEGATIVE NEGATIVE Final      Radiology Studies: Ct Angio Head W Or Wo Contrast Result Date: 04/11/2016 Mild atherosclerotic disease of the carotid bifurcation bilaterally without significant carotid stenosis. 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 Moderate to severe stenosis distal left vertebral artery and moderate stenosis distal right vertebral artery. Severe stenosis proximal  left posterior cerebral artery with diffuse disease in the posterior cerebral artery bilaterally. Chronic microvascular ischemic change in the white matter, advanced for age Small bilateral effusions with bibasilar atelectasis. Electronically Signed   By: Marlan Palau M.D.   On: 04/11/2016 11:32   Ct Angio Neck W Or Wo Contrast Result Date: 04/11/2016 Mild atherosclerotic disease of the carotid bifurcation bilaterally without significant carotid stenosis. 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 Moderate to severe stenosis distal left vertebral artery and moderate stenosis distal right vertebral artery. Severe stenosis proximal left posterior cerebral artery with diffuse disease in the posterior cerebral artery bilaterally. Chronic microvascular ischemic change in the white matter, advanced for age Small bilateral effusions with bibasilar atelectasis. Electronically Signed   By: Marlan Palau M.D.   On: 04/11/2016 11:32   Mr Brain Wo Contrast Result Date: 04/11/2016 1. Focus of diffusion restriction in subcortical white matter at the left frontal parietal junction consistent with acute/early subacute infarct. 2. Focus of diffusion restriction within the left splenium of corpus callosum with different ADC value from the left frontal parietal lesion. This is probably an additional infarct but somewhat atypical. Follow-up MRI of the brain is recommended to ensure resolution of diffusion abnormality and exclude the possibility of underlying neoplasm. 3. Background of advanced chronic microvascular ischemic changes and mild parenchymal volume loss. These results will be called to the ordering clinician or representative by the Radiologist Assistant, and communication documented in the PACS or zVision Dashboard. Electronically Signed   By: Mitzi Hansen M.D.   On: 04/11/2016 05:04   Dg Chest Port 1 View Result Date: 04/11/2016 Stable support apparatus.  Improved bilateral  pulmonary edema. Electronically Signed   By: Lupita Raider, M.D.   On: 04/11/2016 09:12   Dg Chest Portable 1 View Result Date: 04/10/2016 Endotracheal tube and nasogastric catheter in satisfactory position. Diffuse increased density throughout both lungs likely related to underlying pulmonary edema. Electronically Signed   By: Alcide Clever M.D.   On: 04/10/2016 16:19   Ct Head Code Stroke W/o Cm Result Date: 04/10/2016  1. Advanced chronic small vessel ischemic changes throughout the brain. No sign of acute cortical or large vessel infarction. There is atherosclerotic calcification of the major vessels at the base of the brain. 2. ASPECTS is 10. These results were called by telephone at the time of interpretation on 04/10/2016 at 3:47 pm to Dr. Amada Jupiter, who verbally acknowledged  these results. Electronically Signed   By: Paulina Fusi M.D.   On: 04/10/2016 15:48      Scheduled Meds: . aspirin  81 mg Oral Daily  . atorvastatin  80 mg Oral q1800  . carvedilol  6.25 mg Oral BID WC  . clopidogrel  75 mg Oral Daily  . enoxaparin (LOVENOX) injection  40 mg Subcutaneous Q24H  . famotidine  20 mg Oral BID  . insulin aspart  0-15 Units Subcutaneous TID WC  . insulin aspart  0-5 Units Subcutaneous QHS  . insulin glargine  10 Units Subcutaneous Daily  . losartan  25 mg Oral Daily  . potassium chloride  40 mEq Oral Daily  . sodium chloride flush  3 mL Intravenous Q12H   Continuous Infusions:    LOS: 4 days    Time spent: 15 minutes  Greater than 50% of the time spent on counseling and coordinating the care.   Manson Passey, MD [facility] Pager [phone]  If 7PM-7AM, please contact night-coverage [email or web address] Password [email or web address] 04/14/2016, 11:31 AM

## 2016-04-15 ENCOUNTER — Encounter (HOSPITAL_COMMUNITY): Payer: Self-pay

## 2016-04-15 LAB — GLUCOSE, CAPILLARY
Glucose-Capillary: 153 mg/dL — ABNORMAL HIGH (ref 65–99)
Glucose-Capillary: 164 mg/dL — ABNORMAL HIGH (ref 65–99)
Glucose-Capillary: 183 mg/dL — ABNORMAL HIGH (ref 65–99)

## 2016-04-15 MED ORDER — GI COCKTAIL ~~LOC~~
30.0000 mL | Freq: Once | ORAL | Status: AC
  Administered 2016-04-15: 30 mL via ORAL
  Filled 2016-04-15: qty 30

## 2016-04-15 MED ORDER — ATORVASTATIN CALCIUM 80 MG PO TABS: 80.0000 mg | ORAL_TABLET | Freq: Every day | ORAL | 0 refills | Status: DC

## 2016-04-15 MED ORDER — CLOPIDOGREL BISULFATE 75 MG PO TABS: 75.0000 mg | ORAL_TABLET | Freq: Every day | ORAL | 0 refills | Status: DC

## 2016-04-15 MED ORDER — LOSARTAN POTASSIUM 25 MG PO TABS: 25.0000 mg | ORAL_TABLET | Freq: Every day | ORAL | 0 refills | Status: DC

## 2016-04-15 MED ORDER — FAMOTIDINE 20 MG PO TABS: 20.0000 mg | ORAL_TABLET | Freq: Two times a day (BID) | ORAL | 0 refills | Status: DC

## 2016-04-15 MED ORDER — CARVEDILOL 6.25 MG PO TABS: 6.2500 mg | ORAL_TABLET | Freq: Two times a day (BID) | ORAL | 0 refills | Status: DC

## 2016-04-15 MED ORDER — INSULIN GLARGINE 100 UNIT/ML ~~LOC~~ SOLN: 10.0000 [IU] | Freq: Every day | SUBCUTANEOUS | 0 refills | Status: AC

## 2016-04-15 MED ORDER — ASPIRIN 81 MG PO CHEW: 81.0000 mg | CHEWABLE_TABLET | Freq: Every day | ORAL | 0 refills | Status: DC

## 2016-04-15 MED ORDER — ACETAMINOPHEN 325 MG PO TABS: 650.0000 mg | ORAL_TABLET | Freq: Four times a day (QID) | ORAL | 0 refills | Status: AC | PRN

## 2016-04-15 MED FILL — FAMOTIDINE 20 MG TABLET: 20 | 30 days supply | Qty: 60 | Fill #0

## 2016-04-15 MED FILL — LANTUS SOLOSTAR 100 UNITS/M: 100 | 30 days supply | Qty: 3 | Fill #0

## 2016-04-15 MED FILL — LOSARTAN POTASSIUM 25 MG TA: 25 | 30 days supply | Qty: 30 | Fill #0

## 2016-04-15 MED FILL — TRUE METRIX GLUCOSE TEST ST: 30 days supply | Qty: 100 | Fill #0

## 2016-04-15 MED FILL — UNIFINE PENTIPS 31GX3/16: 31G X 5 MM | 30 days supply | Qty: 100 | Fill #0

## 2016-04-15 MED FILL — CARVEDILOL 6.25 MG TABLET: 6.25 | 30 days supply | Qty: 60 | Fill #0

## 2016-04-15 MED FILL — UNIFINE PENTIPS 31GX3/16": 31G X 5 MM | 30 days supply | Qty: 100 | Fill #0

## 2016-04-15 MED FILL — ASPIR-LOW EC 81 MG TABLET: 81 | 30 days supply | Qty: 30 | Fill #0

## 2016-04-15 MED FILL — ATORVASTATIN 80 MG TABLET: 80 | 30 days supply | Qty: 30 | Fill #0

## 2016-04-15 MED FILL — CLOPIDOGREL 75 MG TABLET: 75 | 30 days supply | Qty: 30 | Fill #0

## 2016-04-15 MED FILL — TRUEplus LANCETS 30G MISC: 30 days supply | Qty: 100 | Fill #0

## 2016-04-15 NOTE — Care Management Note (Signed)
Case Management Note  Patient Details  Name: Rebecca Wagner MRN: [id number] Date of Birth: December 01, 1960  Subjective/Objective:                    Action/Plan: Pt discharging home with self care. No f/u per PT. No further needs per CM.   Expected Discharge Date:                  Expected Discharge Plan:  Home/Self Care  In-House Referral:     Discharge planning Services     Post Acute Care Choice:    Choice offered to:     DME Arranged:    DME Agency:     HH Arranged:    HH Agency:     Status of Service:  Completed, signed off  If discussed at [employer] Length of Stay Meetings, dates discussed:    Additional Comments:  Kermit BaloKelli F Cieara Stierwalt, RN 04/15/2016, 11:02 AM

## 2016-04-15 NOTE — Discharge Instructions (Signed)
Blood Glucose Monitoring, Adult °Monitoring your blood glucose (also know as blood sugar) helps you to manage your diabetes. It also helps you and your health care provider monitor your diabetes and determine how well your treatment plan is working. °WHY SHOULD YOU MONITOR YOUR BLOOD GLUCOSE? °· It can help you understand how food, exercise, and medicine affect your blood glucose. °· It allows you to know what your blood glucose is at any given moment. You can quickly tell if you are having low blood glucose (hypoglycemia) or high blood glucose (hyperglycemia). °· It can help you and your health care provider know how to adjust your medicines. °· It can help you understand how to manage an illness or adjust medicine for exercise. °WHEN SHOULD YOU TEST? °Your health care provider will help you decide how often you should check your blood glucose. This may depend on the type of diabetes you have, your diabetes control, or the types of medicines you are taking. Be sure to write down all of your blood glucose readings so that this information can be reviewed with your health care provider. See below for examples of testing times that your health care provider may suggest. °Type 1 Diabetes °· Test at least 2 times per day if your diabetes is well controlled, if you are using an insulin pump, or if you perform multiple daily injections. °· If your diabetes is not well controlled or if you are sick, you may need to test more often. °· It is a good idea to also test: °¨ Before every insulin injection. °¨ Before and after exercise. °¨ Between meals and 2 hours after a meal. °¨ Occasionally between 2:00 a.m. and 3:00 a.m. °Type 2 Diabetes °· If you are taking insulin, test at least 2 times per day. However, it is best to test before every insulin injection. °· If you take medicines by mouth (orally), test 2 times a day. °· If you are on a controlled diet, test once a day. °· If your diabetes is not well controlled or if you  are sick, you may need to monitor more often. °HOW TO MONITOR YOUR BLOOD GLUCOSE °Supplies Needed °· Blood glucose meter. °· Test strips for your meter. Each meter has its own strips. You must use the strips that go with your own meter. °· A pricking needle (lancet). °· A device that holds the lancet (lancing device). °· A journal or log book to write down your results. °Procedure °· Wash your hands with soap and water. Alcohol is not preferred. °· Prick the side of your finger (not the tip) with the lancet. °· Gently milk the finger until a small drop of blood appears. °· Follow the instructions that come with your meter for inserting the test strip, applying blood to the strip, and using your blood glucose meter. °Other Areas to Get Blood for Testing °Some meters allow you to use other areas of your body (other than your finger) to test your blood. These areas are called alternative sites. The most common alternative sites are: °· The forearm. °· The thigh. °· The back area of the lower leg. °· The palm of the hand. °The blood flow in these areas is slower. Therefore, the blood glucose values you get may be delayed, and the numbers are different from what you would get from your fingers. Do not use alternative sites if you think you are having hypoglycemia. Your reading will not be accurate. Always use a finger if you are   having hypoglycemia. Also, if you cannot feel your lows (hypoglycemia unawareness), always use your fingers for your blood glucose checks. ADDITIONAL TIPS FOR GLUCOSE MONITORING  Do not reuse lancets.  Always carry your supplies with you.  All blood glucose meters have a 24-hour "hotline" number to call if you have questions or need help.  Adjust (calibrate) your blood glucose meter with a control solution after finishing a few boxes of strips. BLOOD GLUCOSE RECORD KEEPING It is a good idea to keep a daily record or log of your blood glucose readings. Most glucose meters, if not all,  keep your glucose records stored in the meter. Some meters come with the ability to download your records to your home computer. Keeping a record of your blood glucose readings is especially helpful if you are wanting to look for patterns. Make notes to go along with the blood glucose readings because you might forget what happened at that exact time. Keeping good records helps you and your health care provider to work together to achieve good diabetes management.    This information is not intended to replace advice given to you by your health care provider. Make sure you discuss any questions you have with your health care provider.   Document Released: 06/20/2003 Document Revised: 07/08/2014 Document Reviewed: 11/09/2012 Else[employer] Interactive Patient Education 2016 [employer] Aspirin capsules or tablets extended release What is this medicine? ASPIRIN (AS pir in) is a pain reliever. It is used to treat mild pain and fever. This medicine is also used as directed by a doctor to prevent and to treat heart attacks, to prevent strokes, and to treat arthritis or inflammation. This medicine may be used for other purposes; ask your health care provider or pharmacist if you have questions. What should I tell my health care provider before I take this medicine? They need to know if you have any of these conditions: -anemia -asthma -bleeding problems -child with chickenpox, the flu, or other viral infection -diabetes -gout -if you frequently drink alcohol containing drinks -kidney disease -liver disease -low level of vitamin K -lupus -smoke tobacco -stomach ulcers or other problems -an unusual or allergic reaction to aspirin, tartrazine dye, other medicines, dyes, or preservatives -pregnant or trying to get pregnant -breast-feeding How should I use this medicine? Take this medicine by mouth with a glass of water. Follow the directions on the package or prescription label. Do not chew, crush,  or cut this medicine. You can take this medicine with or without food. If it upsets your stomach, take it with food. Do not take your medicine more often than directed. Talk to your pediatrician regarding the use of this medicine in children. While this drug may be prescribed for children as young as 712 years of age for selected conditions, precautions do apply. Children and teenagers should not use this medicine to treat chicken pox or flu symptoms unless directed by a doctor. Patients over 57 years old may have a stronger reaction and need a smaller dose. Overdosage: If you think you have taken too much of this medicine contact a poison control center or emergency room at once. NOTE: This medicine is only for you. Do not share this medicine with others. What if I miss a dose? If you are taking this medicine on a regular schedule and miss a dose, take it as soon as you can. If it is almost time for your next dose, take only that dose. Do not take double or extra doses. What  may interact with this medicine? Do not take this medicine with any of the following medications: -cidofovir -ketorolac -probenecid This medicine may also interact with the following medications: -alcohol -alendronate -bismuth subsalicylate -flavocoxid -herbal supplements like feverfew, garlic, ginger, ginkgo biloba, horse chestnut -medicines for diabetes or glaucoma like acetazolamide, methazolamide -medicines for gout -medicines that treat or prevent blood clots like enoxaparin, heparin, ticlopidine, warfarin -other aspirin and aspirin-like medicines -NSAIDs, medicines for pain and inflammation, like ibuprofen or naproxen -pemetrexed -sulfinpyrazone -varicella live vaccine This list may not describe all possible interactions. Give your health care provider a list of all the medicines, herbs, non-prescription drugs, or dietary supplements you use. Also tell them if you smoke, drink alcohol, or use illegal drugs. Some  items may interact with your medicine. What should I watch for while using this medicine? If you are treating yourself for pain, tell your doctor or health care professional if the pain lasts more than 10 days, if it gets worse, or if there is a new or different kind of pain. Tell your doctor if you see redness or swelling. Also, check with your doctor if you have a fever that lasts for more than 3 days. Only take this medicine to prevent heart attacks or blood clotting if prescribed by your doctor or health care professional. Do not take aspirin or aspirin-like medicines with this medicine. Too much aspirin can be dangerous. Always read the labels carefully. This medicine can irritate your stomach or cause bleeding problems. Do not smoke cigarettes or drink alcohol while taking this medicine. Do not lie down for 30 minutes after taking this medicine to prevent irritation to your throat. If you are scheduled for any medical or dental procedure, tell your healthcare provider that you are taking this medicine. You may need to stop taking this medicine before the procedure. This medicine may be used to treat migraines. If you take migraine medicines for 10 or more days a month, your migraines may get worse. Keep a diary of headache days and medicine use. Contact your healthcare professional if your migraine attacks occur more frequently. What side effects may I notice from receiving this medicine? Side effects that you should report to your doctor or health care professional as soon as possible: -allergic reactions like skin rash, itching or hives, swelling of the face, lips, or tongue -breathing problems -changes in hearing, ringing in the ears -confusion -general ill feeling or flu-like symptoms -pain on swallowing -redness, blistering, peeling or loosening of the skin, including inside the mouth or nose -signs and symptoms of bleeding such as bloody or black, tarry stools; red or dark-brown urine;  spitting up blood or brown material that looks like coffee grounds; red spots on the skin; unusual bruising or bleeding from the eye, gums, or nose -trouble passing urine or change in the amount of urine -unusually weak or tired -yellowing of the eyes or skin Side effects that usually do not require medical attention (report to your doctor or health care professional if they continue or are bothersome): -diarrhea or constipation -headache -nausea, vomiting -stomach gas, heartburn This list may not describe all possible side effects. Call your doctor for medical advice about side effects. You may report side effects to FDA at 1-800-FDA-1088. Where should I keep my medicine? Keep out of the reach of children. Store at room temperature between 15 and 30 degrees C (59 and 86 degrees F). Protect from heat and moisture. Do not use this medicine if it has a strong  vinegar smell. Throw away any unused medicine after the expiration date. NOTE: This sheet is a summary. It may not cover all possible information. If you have questions about this medicine, talk to your doctor, pharmacist, or health care provider.    2016, Elsevier/[employer] Standard. (2013-02-16 11:29:44) Clopidogrel tablets What is this medicine? CLOPIDOGREL (kloh PID oh grel) helps to prevent blood clots. This medicine is used to prevent heart attack, stroke, or other vascular events in people who are at high risk. This medicine may be used for other purposes; ask your health care provider or pharmacist if you have questions. What should I tell my health care provider before I take this medicine? They need to know if you have any of the following conditions: -bleeding disorder -bleeding in the brain -planned surgery -stomach or intestinal ulcers -stroke or transient ischemic attack -an unusual or allergic reaction to clopidogrel, other medicines, foods, dyes, or preservatives -pregnant or trying to get pregnant -breast-feeding How  should I use this medicine? Take this medicine by mouth with a drink of water. Follow the directions on the prescription label. You may take this medicine with or without food. Take your medicine at regular intervals. Do not take your medicine more often than directed. Talk to your pediatrician regarding the use of this medicine in children. Special care may be needed. Overdosage: If you think you have taken too much of this medicine contact a poison control center or emergency room at once. NOTE: This medicine is only for you. Do not share this medicine with others. What if I miss a dose? If you miss a dose, take it as soon as you can. If it is almost time for your next dose, take only that dose. Do not take double or extra doses. What may interact with this medicine? -aspirin -blood thinners like cilostazol, enoxaparin, ticlopidine, and warfarin -certain medicines for depression like citalopram, fluoxetine, and fluvoxamine -certain medicines for fungal infections like ketoconazole, fluconazole, and voriconazole -certain medicines for HIV infection like delavirdine, efavirenz, and etravirine -certain medicines for seizures like felbamate, oxcarbazepine, and phenytoin -chloramphenicol -fluvastatin -isoniazid, INH -medicines for inflammation like ibuprofen and naproxen -modafinil -nicardipine -over-the counter supplements like echinacea, feverfew, fish oil, garlic, ginger, ginkgo, green tea, horse chestnut -quinine -stomach acid blockers like cimetidine, omeprazole, and esomeprazole -tamoxifen -tolbutamide -topiramate -torsemide This list may not describe all possible interactions. Give your health care provider a list of all the medicines, herbs, non-prescription drugs, or dietary supplements you use. Also tell them if you smoke, drink alcohol, or use illegal drugs. Some items may interact with your medicine. What should I watch for while using this medicine? Visit your doctor or health  care professional for regular check ups. Do not stop taking your medicine unless your doctor tells you to. Notify your doctor or health care professional and seek emergency treatment if you develop breathing problems; changes in vision; chest pain; severe, sudden headache; pain, swelling, warmth in the leg; trouble speaking; sudden numbness or weakness of the face, arm or leg. These can be signs that your condition has gotten worse. If you are going to have surgery or dental work, tell your doctor or health care professional that you are taking this medicine. Certain genetic factors may reduce the effect of this medicine. Your doctor may use genetic tests to determine treatment. What side effects may I notice from receiving this medicine? Side effects that you should report to your doctor or health care professional as soon as possible: -allergic reactions  like skin rash, itching or hives, swelling of the face, lips, or tongue -breathing problems -changes in vision -fever -signs and symptoms of bleeding such as bloody or black, tarry stools; red or dark-brown urine; spitting up blood or brown material that looks like coffee grounds; red spots on the skin; unusual bruising or bleeding from the eye, gums, or nose -sudden weakness -unusual bleeding or bruising Side effects that usually do not require medical attention (report to your doctor or health care professional if they continue or are bothersome): -constipation or diarrhea -headache -pain in back or joints -stomach upset This list may not describe all possible side effects. Call your doctor for medical advice about side effects. You may report side effects to FDA at 1-800-FDA-1088. Where should I keep my medicine? Keep out of the reach of children. Store at room temperature of 59 to 86 degrees F (15 to 30 degrees C). Throw away any unused medicine after the expiration date. NOTE: This sheet is a summary. It may not cover all possible  information. If you have questions about this medicine, talk to your doctor, pharmacist, or health care provider.    2016, Elsevier/Gold Standard. (2012-10-13 16:34:37)

## 2016-04-15 NOTE — Discharge Summary (Signed)
Physician Discharge Summary  Rebecca Wagner WJX:[id number] DOB: [birth year] DOA: 04/10/2016  PCP: [name], MD  Admit date: 04/10/2016 Discharge date: 04/15/2016  Recommendations for Outpatient Follow-up:  Continue aspirin, Plavix on discharge Please continue carvedilol and losartan on discharge Continue Lipitor 80 mg at bedtime Please note we added Lantus 10 units daily in addition to sliding scale for CBG control.  Discharge Diagnoses:  Principal Problem:   Left Frontal-Parietal CVA Active Problems:   Acute pulmonary edema (HCC)   Hypertensive emergency   ST elevation - in setting of CVA & HTN Emergency - NOT MI   Multiple vessel coronary artery disease   Diabetes mellitus type 2, insulin dependent (HCC)   Acute respiratory failure with hypoxia (HCC)    Discharge Condition: stable   Diet recommendation: as tolerated   History of present illness:   55 year old female with past medical history of hypertension, DM who presented to [facility] 10/11 after she was found to have left foot drop while at work. 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 She was transported to [facility] ED as a code stroke and underwent CT scan demonstrating no hemorrhage or clear evidence of acute infarct. Shortly after returning from CT she developed shortness of breath and desaturation and was subsequently intubated. ST changes were noted during this episode and she was taken to cath lab where diffuse CAD was discovered and no intervention was done. After the cath she was sent to ICU for recovery and further evaluation.  SIGNIFICANT EVENTS: 10/11 >AMS at work, code stroke, ST changes to cath lab, no intervention. Out to ICU on vent.  10/14 - [facility] assumed care   LINES/TUBES: ETT 10/11>>>10/12  Hospital Course:    Assessment & Plan:   Principal Problem: Acute respiratory failure with hypoxia (HCC) - Due to acute CVA - Extubated 10/12 - Stable resp status   Active Problems: Left  Frontal-Parietal CVA - Aspirin daily - MRI brain - Focus of diffusion restriction in subcortical white matter at the left frontal parietal junction consistent with acute/early subacute infarct. Focus of diffusion restriction within the left splenium of corpus callosum with different ADC value from the left frontal parietal lesion. This is probably an additional infarct but somewhat atypical. Follow-up MRI of the brain is recommended to ensure resolution of diffusion abnormality and exclude the possibility of underlying neoplasm. Background of advanced chronic microvascular ischemic changes and mild parenchymal volume loss. - CT angio head and neck - Mild atherosclerotic disease of the carotid bifurcation bilaterally without significant carotid stenosis. 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 Moderate to severe stenosis distal left vertebral artery and moderate stenosis distal right vertebral artery. Severe stenosis proximal left posterior cerebral artery with diffuse disease in the posterior cerebral artery bilaterally. - 2D ECHO - EF 45%; Regional wall motion abnormality: Akinesis of the apical inferiorand apical myocardium; severe hypokinesis of the basal inferior, apical septal, and apical lateral myocardium; moderate hypokinesis of the mid inferoseptal, mid inferior, and mid anterolateral myocardium; mild hypokinesis of the basal anterolateral myocardium. Mitral valve: Mildly calcified annulus. There was moderate regurgitation. - HgbA1c 12.2 - LDL 165; goal should be less than 100; Pt on Lipitor 80 mg at bedtime  - Diet: Carb modified  - Therapy: PT/OT - No PT follow up required  - Appreciate neurology following  Uncontrolled diabetes mellitus with peripheral circulatory complications with long term insulin use - Continue Lantus 10 units daily and SSI on discharge  - A1c 12.2  Hypertensive emergency - Continue coreg 6.25 mg PO BID and losartan 25 mg daily   ST  elevation - in setting of CVA & HTN Emergency - NOT MI / Multiple vessel coronary artery disease / Troponin elevation  - Troponin level 0.17 --> 0.16 - S/p cardiac cath - 2 D ECHO on this admission with EF 45% - No chest pain   Hypokalemia - Repeat potassium WNL  Anemia of chronic disease - Hgb stable     DVT prophylaxis: Lovenox subQ  Code Status: full code  Family Communication: no family at the bedside this am    Consultants:   PCCM initially primary team, [facility] assumed care 10/14  Cardiology   SLP  DM  Neurology   Antimicrobials:   None     Signed:  Manson Passey, MD  [facility] 04/15/2016, 10:49 AM  Pager #: [phone]  Time spent in minutes: less than 30 minutes    Discharge Exam: Vitals:   04/15/16 0200 04/15/16 0535  BP: (!) 157/81 (!) 152/88  Pulse: 77 89  Resp: 18 18  Temp: 98.4 F (36.9 C) 98 F (36.7 C)   Vitals:   04/14/16 1730 04/14/16 2118 04/15/16 0200 04/15/16 0535  BP: (!) 152/82 138/64 (!) 157/81 (!) 152/88  Pulse: 89 83 77 89  Resp: 18 18 18 18   Temp: 98.9 F (37.2 C) 98.1 F (36.7 C) 98.4 F (36.9 C) 98 F (36.7 C)  TempSrc: Oral Oral Oral Oral  SpO2: 100% 100% 100% 100%  Weight:      Height:        General: Pt is alert, follows commands appropriately, not in acute distress Cardiovascular: Regular rate and rhythm, S1/S2 + Respiratory: Clear to auscultation bilaterally, no wheezing, no crackles, no rhonchi Abdominal: Soft, non tender, non distended, bowel sounds +, no guarding Extremities: no edema, no cyanosis, pulses palpable bilaterally DP and PT Neuro: Grossly nonfocal  Discharge Instructions  Discharge Instructions    AMB Referral to [facility] Care Management    Complete by:  As directed    Please call to schedule Link to Wellness appointment for DM. Has packet. Please assign UMR member for post toc follow up as well. Will need both discharge call and call for LTW appointment. Thanks. [name], MSN-Ed, [facility] [phone]   Reason for consult:  Please call for Link to Wellness appointment for DM management. Also please assign UMR member for post toc   Diagnoses of:   Stroke: Ischemic/TIA Diabetes     Expected date of contact:  1-3 days (reserved for hospital discharges)   Ambulatory referral to Neurology    Complete by:  As directed    Pt will follow up with Dr. Roda Shutters at [facility] in about 2 months. Thanks.   Ambulatory referral to Nutrition and Diabetic Education    Complete by:  As directed    Call MD for:  difficulty breathing, headache or visual disturbances    Complete by:  As directed    Call MD for:  persistant nausea and vomiting    Complete by:  As directed    Call MD for:  redness, tenderness, or signs of infection (pain, swelling, redness, odor or green/yellow discharge around incision site)    Complete by:  As directed    Call MD for:  severe uncontrolled pain    Complete by:  As directed    Diet - low sodium heart healthy    Complete by:  As directed  Discharge instructions    Complete by:  As directed    Continue aspirin, Plavix on discharge Please continue carvedilol and losartan on discharge Continue Lipitor 80 mg at bedtime Please note we added Lantus 10 units daily in addition to sliding scale for CBG control.   Increase activity slowly    Complete by:  As directed        Medication List    STOP taking these medications   fluconazole 100 MG tablet Commonly known as:  DIFLUCAN   oxyCODONE 5 MG immediate release tablet Commonly known as:  Oxy IR/ROXICODONE   oxyCODONE-acetaminophen 5-325 MG tablet Commonly known as:  PERCOCET/ROXICET     TAKE these medications   acetaminophen 325 MG tablet Commonly known as:  TYLENOL Take 2 tablets (650 mg total) by mouth every 6 (six) hours as needed for fever.   ACIDOPHILUS PO Take 1-2 tablets by mouth daily.   aspirin 81 MG chewable tablet Chew 1 tablet (81 mg  total) by mouth daily. Start taking on:  04/16/2016   atorvastatin 80 MG tablet Commonly known as:  LIPITOR Take 1 tablet (80 mg total) by mouth daily at 6 PM.   CALCIUM-MAGNESIUM-ZINC PO Take 1 tablet by mouth daily.   carvedilol 6.25 MG tablet Commonly known as:  COREG Take 1 tablet (6.25 mg total) by mouth 2 (two) times daily with a meal.   clopidogrel 75 MG tablet Commonly known as:  PLAVIX Take 1 tablet (75 mg total) by mouth daily. Start taking on:  04/16/2016   famotidine 20 MG tablet Commonly known as:  PEPCID Take 1 tablet (20 mg total) by mouth 2 (two) times daily.   insulin aspart 100 UNIT/ML injection Commonly known as:  novoLOG Inject 5-10 Units into the skin 3 (three) times daily as needed for high blood sugar.   insulin glargine 100 UNIT/ML injection Commonly known as:  LANTUS Inject 0.1 mLs (10 Units total) into the skin daily. Start taking on:  04/16/2016   losartan 25 MG tablet Commonly known as:  COZAAR Take 1 tablet (25 mg total) by mouth daily. Start taking on:  04/16/2016   Magnesium 250 MG Tabs Take 250 mg by mouth daily.      Follow-up Information    Xu,Jindong, MD. Schedule an appointment as soon as possible for a visit in 6 week(s).   Specialty:  Neurology Contact information: [address] [place] [address] [phone]        Azalee Course, [place] .   Specialties:  Cardiology, Radiology Why:  Follow up arranged with Azalee Course, PA-C on 05/01/16 at 9am. Wynema Birch is one of the PAs that works with Dr. Herbie Baltimore. Contact information: [address] [place] [address] [phone]        [name], MD. Schedule an appointment as soon as possible for a visit in 1 week(s).   Specialty:  Family Medicine Contact information: [address] [place] [address] [phone]            The results of significant diagnostics from this hospitalization (including imaging, microbiology,  ancillary and laboratory) are listed below for reference.    Significant Diagnostic Studies: Ct Angio Head W Or Wo Contrast  Result Date: 04/11/2016 CLINICAL DATA:  Stroke EXAM: CT ANGIOGRAPHY HEAD AND NECK TECHNIQUE: Multidetector CT imaging of the head and neck was performed using the standard protocol during bolus administration of intravenous contrast. Multiplanar CT image reconstructions and MIPs were obtained to evaluate  the vascular anatomy. Carotid stenosis measurements (when applicable) are obtained utilizing NASCET criteria, using the distal internal carotid diameter as the denominator. CONTRAST:  50 mL Isovue 370 IV COMPARISON:  MRI head 04/11/2016 FINDINGS: CTA NECK FINDINGS Aortic arch: Mild atherosclerotic calcification aortic arch without aneurysm or dissection. Proximal great vessels widely patent with mild atherosclerotic disease of the origin of the left subclavian artery. Right carotid system: Right common carotid artery widely patent without stenosis. Mild atherosclerotic disease at the right carotid bifurcation without significant stenosis or dissection Left carotid system: Left common carotid artery widely patent with scattered areas of mild atherosclerotic calcification. Mild atherosclerotic disease in the carotid bulb on the left without significant carotid stenosis. No dissection. Vertebral arteries: Both vertebral arteries are equal in size and patent to the basilar. Proximal vertebral arteries widely patent. Atherosclerotic calcification and stenosis of the distal vertebral artery at the level of the dura. Moderate to severe stenosis on the left and moderate stenosis on the right. Skeleton: Negative Other neck: Negative for mass or adenopathy. Upper chest: Endotracheal tube just above the carina. No mass or adenopathy in the neck. NG tube in the esophagus. Small pleural effusions bibasilar bilaterally with bibasilar atelectasis. Review of the MIP images confirms the above findings  CTA HEAD FINDINGS Anterior circulation: Extensive atherosclerotic calcification in the cavernous carotid bilaterally causing moderate to severe stenosis bilaterally. Carotid in remains patent bilaterally. Anterior and middle cerebral arteries patent bilaterally without significant stenosis. Negative for aneurysm. Posterior circulation: Moderate to severe stenosis distal left vertebral artery and moderate stenosis distal right vertebral artery. PICA patent bilaterally. Basilar widely patent. Superior cerebellar and posterior cerebral arteries patent bilaterally. Severe stenosis proximal left posterior cerebral artery and mild stenosis distal left posterior cerebral artery. Mild stenosis proximal right posterior cerebral artery. No aneurysm in the posterior circulation Venous sinuses: Patent Anatomic variants: None Delayed phase: Normal enhancement on delayed imaging. Chronic microvascular ischemic change throughout the white matter, advanced for age. No intracranial hemorrhage or mass. Review of the MIP images confirms the above findings IMPRESSION: Mild atherosclerotic disease of the carotid bifurcation bilaterally without significant carotid stenosis. 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 Moderate to severe stenosis distal left vertebral artery and moderate stenosis distal right vertebral artery. Severe stenosis proximal left posterior cerebral artery with diffuse disease in the posterior cerebral artery bilaterally. Chronic microvascular ischemic change in the white matter, advanced for age Small bilateral effusions with bibasilar atelectasis. Electronically Signed   By: Marlan Palauharles  Clark M.D.   On: 04/11/2016 11:32   Ct Angio Neck W Or Wo Contrast  Result Date: 04/11/2016 CLINICAL DATA:  Stroke EXAM: CT ANGIOGRAPHY HEAD AND NECK TECHNIQUE: Multidetector CT imaging of the head and neck was performed using the standard protocol during bolus administration of intravenous contrast.  Multiplanar CT image reconstructions and MIPs were obtained to evaluate the vascular anatomy. Carotid stenosis measurements (when applicable) are obtained utilizing NASCET criteria, using the distal internal carotid diameter as the denominator. CONTRAST:  50 mL Isovue 370 IV COMPARISON:  MRI head 04/11/2016 FINDINGS: CTA NECK FINDINGS Aortic arch: Mild atherosclerotic calcification aortic arch without aneurysm or dissection. Proximal great vessels widely patent with mild atherosclerotic disease of the origin of the left subclavian artery. Right carotid system: Right common carotid artery widely patent without stenosis. Mild atherosclerotic disease at the right carotid bifurcation without significant stenosis or dissection Left carotid system: Left common carotid artery widely patent with scattered areas of mild atherosclerotic calcification. Mild atherosclerotic disease in the  carotid bulb on the left without significant carotid stenosis. No dissection. Vertebral arteries: Both vertebral arteries are equal in size and patent to the basilar. Proximal vertebral arteries widely patent. Atherosclerotic calcification and stenosis of the distal vertebral artery at the level of the dura. Moderate to severe stenosis on the left and moderate stenosis on the right. Skeleton: Negative Other neck: Negative for mass or adenopathy. Upper chest: Endotracheal tube just above the carina. No mass or adenopathy in the neck. NG tube in the esophagus. Small pleural effusions bibasilar bilaterally with bibasilar atelectasis. Review of the MIP images confirms the above findings CTA HEAD FINDINGS Anterior circulation: Extensive atherosclerotic calcification in the cavernous carotid bilaterally causing moderate to severe stenosis bilaterally. Carotid in remains patent bilaterally. Anterior and middle cerebral arteries patent bilaterally without significant stenosis. Negative for aneurysm. Posterior circulation: Moderate to severe stenosis  distal left vertebral artery and moderate stenosis distal right vertebral artery. PICA patent bilaterally. Basilar widely patent. Superior cerebellar and posterior cerebral arteries patent bilaterally. Severe stenosis proximal left posterior cerebral artery and mild stenosis distal left posterior cerebral artery. Mild stenosis proximal right posterior cerebral artery. No aneurysm in the posterior circulation Venous sinuses: Patent Anatomic variants: None Delayed phase: Normal enhancement on delayed imaging. Chronic microvascular ischemic change throughout the white matter, advanced for age. No intracranial hemorrhage or mass. Review of the MIP images confirms the above findings IMPRESSION: Mild atherosclerotic disease of the carotid bifurcation bilaterally without significant carotid stenosis. 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 Moderate to severe stenosis distal left vertebral artery and moderate stenosis distal right vertebral artery. Severe stenosis proximal left posterior cerebral artery with diffuse disease in the posterior cerebral artery bilaterally. Chronic microvascular ischemic change in the white matter, advanced for age Small bilateral effusions with bibasilar atelectasis. Electronically Signed   By: Marlan Palau M.D.   On: 04/11/2016 11:32   Mr Brain Wo Contrast  Result Date: 04/11/2016 CLINICAL DATA:  55 y/o F; left leg weakness and altered mental status follow by shortness of breath requiring intubation. EXAM: MRI HEAD WITHOUT CONTRAST TECHNIQUE: Multiplanar, multiecho pulse sequences of the brain and surrounding structures were obtained without intravenous contrast. COMPARISON:  04/10/2016 CT head FINDINGS: Brain: Focus of diffusion restriction within subcortical white matter in the left frontoparietal junction and small punctate focus more posteriorly and parietal white matter. Additional punctate focus of diffusion restriction with mildly decreased ADC in the  left forceps of the splenium of corpus callosum, slight mass effect, and intermediate T2 signal centrally or ADC is lowest. There is a background of extensive T2 FLAIR hyperintense signal abnormality throughout subcortical and periventricular white matter of the brain and within the pons compatible with advanced chronic microvascular ischemic changes. No abnormal susceptibility hypointensity to indicate intracranial hemorrhage. No focal mass effect. No extra-axial collection. No hydrocephalus. Mild parenchymal volume loss. Vascular: Normal flow voids. Skull and upper cervical spine: Normal marrow signal. Sinuses/Orbits: Mucosal thickening and fluid levels within the nasal passages and nasopharynx is likely due to intubation. No abnormal signal of mastoid air cells. T2 hyperintense lesion anterior to the left ear within the subcutaneous fat probably represents a dermal appendage cyst measuring up to 14 mm. Other: None. IMPRESSION: 1. Focus of diffusion restriction in subcortical white matter at the left frontal parietal junction consistent with acute/early subacute infarct. 2. Focus of diffusion restriction within the left splenium of corpus callosum with different ADC value from the left frontal parietal lesion. This is probably an additional infarct but  somewhat atypical. Follow-up MRI of the brain is recommended to ensure resolution of diffusion abnormality and exclude the possibility of underlying neoplasm. 3. Background of advanced chronic microvascular ischemic changes and mild parenchymal volume loss. These results will be called to the ordering clinician or representative by the Radiologist Assistant, and communication documented in the PACS or zVision Dashboard. Electronically Signed   By: Mitzi Hansen M.D.   On: 04/11/2016 05:04   Dg Chest Port 1 View  Result Date: 04/11/2016 CLINICAL DATA:  Acute respiratory failure. EXAM: PORTABLE CHEST 1 VIEW COMPARISON:  Radiograph of April 10, 2016.  FINDINGS: Stable cardiomediastinal silhouette. Endotracheal and nasogastric tubes are unchanged in position. No pneumothorax or significant pleural effusion is noted. Mild bilateral diffuse interstitial densities are noted which are improved compared to prior exam, suggesting improved pulmonary edema. Bony thorax is unremarkable. IMPRESSION: Stable support apparatus.  Improved bilateral pulmonary edema. Electronically Signed   By: Lupita Raider, M.D.   On: 04/11/2016 09:12   Dg Chest Portable 1 View  Result Date: 04/10/2016 CLINICAL DATA:  Check endotracheal tube placement EXAM: PORTABLE CHEST 1 VIEW COMPARISON:  None. FINDINGS: Endotracheal tube is noted 4.4 cm above the carina. The cardiac shadow is at the upper limits of normal in size. Diffuse increased density is noted throughout both lungs likely related to diffuse pulmonary edema. No sizable effusions are seen. No bony abnormality is noted. Nasogastric catheter courses into the stomach. IMPRESSION: Endotracheal tube and nasogastric catheter in satisfactory position. Diffuse increased density throughout both lungs likely related to underlying pulmonary edema. Electronically Signed   By: Alcide Clever M.D.   On: 04/10/2016 16:19   Ct Head Code Stroke W/o Cm  Result Date: 04/10/2016 CLINICAL DATA:  Code stroke. Code stroke. Confusion. Left leg weakness. Diabetes. EXAM: CT HEAD WITHOUT CONTRAST TECHNIQUE: Contiguous axial images were obtained from the base of the skull through the vertex without intravenous contrast. COMPARISON:  None. FINDINGS: Brain: There are advanced chronic small vessel ischemic changes throughout the brain. No identifiable acute infarction, mass lesion, hemorrhage, hydrocephalus or extra-axial collection. Vascular: There is atherosclerotic calcification of the major vessels at the base of the brain. Skull: Normal Sinuses/Orbits: Clear/normal Other: None significant ASPECTS (Alberta Stroke Program Early CT Score) - Ganglionic  level infarction (caudate, lentiform nuclei, internal capsule, insula, M1-M3 cortex): 7 - Supraganglionic infarction (M4-M6 cortex): 3 Total score (0-10 with 10 being normal): 10 IMPRESSION: 1. Advanced chronic small vessel ischemic changes throughout the brain. No sign of acute cortical or large vessel infarction. There is atherosclerotic calcification of the major vessels at the base of the brain. 2. ASPECTS is 10. These results were called by telephone at the time of interpretation on 04/10/2016 at 3:47 pm to Dr. Amada Jupiter, who verbally acknowledged these results. Electronically Signed   By: Paulina Fusi M.D.   On: 04/10/2016 15:48    Microbiology: Recent Results (from the past 240 hour(s))  MRSA PCR Screening     Status: None   Collection Time: 04/10/16  5:36 PM  Result Value Ref Range Status   MRSA by PCR NEGATIVE NEGATIVE Final    Comment:        The GeneXpert MRSA Assay (FDA approved for NASAL specimens only), is one component of a comprehensive MRSA colonization surveillance program. It is not intended to diagnose MRSA infection nor to guide or monitor treatment for MRSA infections.      Labs: Basic Metabolic Panel:  Recent Labs Lab 04/10/16 1525 04/10/16 1534 04/11/16 0610 04/12/16  0244 04/13/16 0324  NA 135 137 139 137 138  K 3.5 3.5 3.4* 3.1* 3.7  CL 103 103 105 105 106  CO2 20*  --  26 24 23   GLUCOSE 220* 222* 91 156* 172*  BUN 20 22* 18 11 12   CREATININE 0.73 0.60 0.74 0.58 0.63  CALCIUM 9.3  --  9.1 8.5* 8.7*  MG  --   --   --  1.6* 1.9  PHOS  --   --   --  2.8  --    Liver Function Tests:  Recent Labs Lab 04/10/16 1525  AST 19  ALT 13*  ALKPHOS 88  BILITOT 0.6  PROT 7.4  ALBUMIN 3.3*   No results for input(s): LIPASE, AMYLASE in the last 168 hours. No results for input(s): AMMONIA in the last 168 hours. CBC:  Recent Labs Lab 04/10/16 1525 04/10/16 1534 04/11/16 0610 04/12/16 0244  WBC 7.7  --  13.9* 9.0  NEUTROABS 4.5  --  11.0*  --    HGB 11.9* 12.9 11.8* 10.0*  HCT 36.0 38.0 36.1 31.1*  MCV 86.3  --  87.2 86.9  PLT 446*  --  425* 382   Cardiac Enzymes:  Recent Labs Lab 04/11/16 0820 04/11/16 1430  TROPONINI 0.17* 0.16*   BNP: BNP (last 3 results) No results for input(s): BNP in the last 8760 hours.  ProBNP (last 3 results) No results for input(s): PROBNP in the last 8760 hours.  CBG:  Recent Labs Lab 04/14/16 0645 04/14/16 1102 04/14/16 1632 04/14/16 2114 04/15/16 0657  GLUCAP 156* 217* 153* 133* 164*

## 2016-04-15 NOTE — Progress Notes (Signed)
Patient discharged from room 68M-09 via wheelchair.  Discharge instructions read with patient and patient verbalized understanding.  All of patient's belongings sent with patient.

## 2016-04-15 NOTE — Progress Notes (Addendum)
     SUBJECTIVE: No chest pain or dyspnea.   BP (!) 152/88 (BP Location: Left Arm)   Pulse 89   Temp 98 F (36.7 C) (Oral)   Resp 18   Ht 5\' 4"  (1.626 m)   Wt 161 lb 6.4 oz (73.2 kg)   SpO2 100%   BMI 27.70 kg/m  No intake or output data in the 24 hours ending 04/15/16 0830  PHYSICAL EXAM General: Well developed, well nourished, in no acute distress. Alert and oriented x 3.  Psych:  Good affect, responds appropriately Neck: No JVD. No masses noted.  Lungs: Clear bilaterally with no wheezes or rhonci noted.  Heart: RRR with no murmurs noted. Abdomen: Bowel sounds are present. Soft, non-tender.  Extremities: No lower extremity edema.   LABS: Basic Metabolic Panel:  Recent Labs  40/98/1110/14/17 0324  NA 138  K 3.7  CL 106  CO2 23  GLUCOSE 172*  BUN 12  CREATININE 0.63  CALCIUM 8.7*  MG 1.9   Current Meds: . aspirin  81 mg Oral Daily  . atorvastatin  80 mg Oral q1800  . carvedilol  6.25 mg Oral BID WC  . clopidogrel  75 mg Oral Daily  . enoxaparin (LOVENOX) injection  40 mg Subcutaneous Q24H  . famotidine  20 mg Oral BID  . insulin aspart  0-15 Units Subcutaneous TID WC  . insulin aspart  0-5 Units Subcutaneous QHS  . insulin glargine  10 Units Subcutaneous Daily  . losartan  25 mg Oral Daily  . potassium chloride  40 mEq Oral Daily  . sodium chloride flush  3 mL Intravenous Q12H   Echo 04/13/16: Left ventricle: The cavity size was normal. There was mild   concentric hypertrophy. Systolic function was mildly to   moderately reduced. The estimated ejection fraction was in the   range of 40% to 45%. Diastolic dysfunction, grade indeterminate.   Elevated filling pressures. - Regional wall motion abnormality: Akinesis of the apical inferior   and apical myocardium; severe hypokinesis of the basal inferior,   apical septal, and apical lateral myocardium; moderate   hypokinesis of the mid inferoseptal, mid inferior, and mid   anterolateral myocardium; mild hypokinesis  of the basal   anterolateral myocardium. - Mitral valve: Mildly calcified annulus. There was moderate   regurgitation. - Left atrium: The atrium was moderately dilated.  ASSESSMENT AND PLAN:  1. Stroke: Primary presenting issue. Continue ASA, beta blocker, plavix and statin  2. Severe 3V CAD/ ischemic CM: She has advanced nonrevascularizable CAD. Plans for medical management of CAD. Echo with mildly reduced LVEF=40-45%. This is likely related to her severe CAD.  Continue ASA, plavix, statin, coreg, losartan  3. Hypertensive urgency: BP is improved. Would increase Coreg to 12.5 mg po BID today.    No further inpatient CV testing planned. She is ready for d/c from a cardiac standpoint. We will arrange cardiac f/u with Dr. Herbie BaltimoreHarding.   Bernie Fobes  10/16/20178:30 AM

## 2016-04-15 NOTE — Progress Notes (Signed)
F/u arranged with Azalee CourseHao Meng PA-C on 05/01/16, appt put in f/u section.  Dayna Dunn PA-C

## 2016-04-16 ENCOUNTER — Other Ambulatory Visit: Payer: Self-pay | Admitting: *Deleted

## 2016-04-16 ENCOUNTER — Encounter: Payer: Self-pay | Admitting: *Deleted

## 2016-04-16 NOTE — Patient Outreach (Signed)
[facility]) Care Management  04/16/2016  Stevphen MeuseValerie S Odriscoll 1960-07-08 [id number]   Subjective: Telephone call to patient's home number, no answer, left HIPAA compliant voicemail message, and requested call back.   Telephone call from patient and HIPAA verified.  Discussed [facility] Care Management UMR Transition of care follow and Link to Wellness program.   Patient states she remembers speaking with this RNCM last month, is in agreement to complete transition of care follow up, and to enroll in Link to [employer] program. States she is hanging in there and has had a lot going on.    She is working with [employer] and MD's office to complete the family medical leave act paperwork for this illness.  Has supplemental insurance [facility](Hospital Indemnity ) and will follow up to file the claims.  [facility].  Patient states she has a hospital follow up appointment with primary MD on 04/17/16 and will call to schedule follow up with neurologist.    States cardiologist office will call her to set up follow up appointment.  Patient states she does not have any transition of care, care coordination,  community resource, or pharmacy needs at this time.  Patient has Link to [employer] packet and is in agreement to receive successful outreach letter.  States she is very appreciative of the follow up call.     Objective: Per chart review: Patient hospitalized 07/12/15 -04/15/16 Left Frontal-Parietal CVA and ST elevation (no MI).  Patient hospitalized 02/29/16 - 03/02/16 for Acute perforated appendicitis, Oral candidiasis, andthrush. Status post LAPAROSCOPIC APPENDECTOMY on 02/29/16. Patient also has a history of depression, diabetes, and hypertension.   Assessment: UMR Transition of care referral on 04/15/16. Transition of care follow up, completed.  Patient has no Telephonic RNCM  needs at this time and has been referred to [facility] for  follow up.    Plan: RNCM will send patient successful outreach letter. RNCM will send Link to Wellness referral to Iverson AlaminLaura Greeson at [facility] Care [facility] via in basket and email.    Velita Quirk H. Gardiner Barefootooper RN, BSN, CCM [facility] Care [facility] Telephonic CM Phone: [phone] Fax: [phone]

## 2016-04-17 DIAGNOSIS — I10 Essential (primary) hypertension: Secondary | ICD-10-CM | POA: Diagnosis not present

## 2016-04-17 DIAGNOSIS — E119 Type 2 diabetes mellitus without complications: Secondary | ICD-10-CM | POA: Diagnosis not present

## 2016-04-17 DIAGNOSIS — I679 Cerebrovascular disease, unspecified: Secondary | ICD-10-CM | POA: Diagnosis not present

## 2016-04-23 ENCOUNTER — Other Ambulatory Visit: Payer: Self-pay | Admitting: *Deleted

## 2016-04-23 NOTE — Patient Outreach (Addendum)
[facility]) Care Management  04/23/2016  Rebecca Wagner July 14, 1960 [id number]   Subjective: Received voicemail message from patient, requesting call back (on home number [phone] or mobile [phone]) to schedule appointment with Link to Wellness [facility] for diabetic teaching.  E-mail request sent to Iverson AlaminLaura Greeson at [facility] Care [facility] to contact patient to schedule appointment.  Objective: Per chart review: Patient hospitalized 07/12/15 -04/15/16 Left Frontal-Parietal CVA and ST elevation (no MI).  Patient hospitalized 02/29/16 - 03/02/16 for Acute perforated appendicitis, Oral candidiasis, andthrush. Status post LAPAROSCOPIC APPENDECTOMY on 02/29/16. Patient also has a history of depression, diabetes, and hypertension.   Assessment: UMR Transition of care referral on 04/15/16. Transition of care follow up, completed. Patient has no Telephonic RNCM needs at this time and [facility] follow up.   Plan: Link to Wellness team will follow up to schedule office visit for diabetic teaching within 10 business days.    Blakelee Allington H. Gardiner Barefootooper RN, BSN, CCM [facility] Care Management [facility] Telephonic CM Phone: [phone] Fax: [phone]

## 2016-04-24 DIAGNOSIS — I10 Essential (primary) hypertension: Secondary | ICD-10-CM | POA: Diagnosis not present

## 2016-04-24 DIAGNOSIS — I679 Cerebrovascular disease, unspecified: Secondary | ICD-10-CM | POA: Diagnosis not present

## 2016-04-24 DIAGNOSIS — E119 Type 2 diabetes mellitus without complications: Secondary | ICD-10-CM | POA: Diagnosis not present

## 2016-05-01 ENCOUNTER — Encounter: Payer: 59 | Admitting: Physician Assistant

## 2016-05-01 NOTE — Progress Notes (Signed)
Error

## 2016-05-01 NOTE — Progress Notes (Deleted)
Cardiology Office Note    Date:  05/01/2016   ID:  RAYEL SANTIZO, DOB 09-10-1960, MRN [id number]  PCP:  [name], MD  Cardiologist:  Dr. Kirke Corin  Chief Complaint  Patient presents with  . Hospitalization Follow-up    seen for Dr. Kirke Corin    History of Present Illness:  Rebecca Wagner is a 55 y.o. female with PMH of HTN, DM, and recently diagnosed CAD and stroke. She has a prolonged history of uncontrolled diabetes. She works as a Engineer, civil (consulting) at [employer]. She did on undergo recent appendectomy, she has not been feeling well since. While at work on 04/10/2016, she developed acute shortness of breath with hypoxia and mental status change. He was also dyspneic and diaphoretic. O2 saturation dropped into the 30s after CT scan. She was intubated for severe hypoxia. Initial cold stroke was called out but a.m. was nonfocal. EGD showed diffuse ST elevation in the precordial leads and because she was transferred to [facility] for emergent cardiac catheterization. While en route by EMS, her blood pressure was 200/120.  Cardiac catheterization performed on 04/10/2016 showed 95% proximal to distal RCA, 90% distal left circumflex, 70% proximal left circumflex, 99% mid to distal LAD, 90% D2, 90% D1, 99% OM 3, 70% OM 4. Ejection fraction was 35-45%. The lesions are not amenable for PCI given poor targets and diffuse disease She had severe diffuse and heavily calcified three-vessel disease. LV gram, she also had akinesis of the apical segment, pattern suggestive of stress-induced cardiomyopathy, however LAD ischemia cannot be excluded. There was no clear culprit for unstable angina, although it is possible that she developed flash pulmonary edema in the setting of uncontrolled hypertension, however her LVEDP was close to normal. Given no obvious explanation for hypertensive emergency, there was still significant concern for neurological event. After discussing with neurology, MRI of the brain was  ordered which did show left frontoparietal and a separate left splenium of corpus callosum infarct. She was not given TPA initially due to minimal symptoms and nonfocal examination finding.   Given the lack of PCI targets, medical therapy was initiated. Neurology did recommend aspirin and Plavix given both cardiac and neurological condition. Repeat echocardiogram obtained on 04/13/2016 continued to show EF 40-45%, wall motion abnormality, moderate MR.  No EKG unless chest pain since discharge    Past Surgical History:  Procedure Laterality Date  . CARDIAC CATHETERIZATION N/A 04/10/2016   Procedure: Left Heart Cath and Coronary Angiography;  Surgeon: Iran Ouch, MD;  Location: [facility] INVASIVE CV LAB;  Service: Cardiovascular;  Laterality: N/A;  . CESAREAN SECTION    . LAPAROSCOPIC APPENDECTOMY N/A 02/29/2016   Procedure: LAPAROSCOPIC APPENDECTOMY;  Surgeon: Axel Filler, MD;  Location: [facility] OR;  Service: General;  Laterality: N/A;    Current Medications: Outpatient Medications Prior to Visit  Medication Sig Dispense Refill  . acetaminophen (TYLENOL) 325 MG tablet Take 2 tablets (650 mg total) by mouth every 6 (six) hours as needed for fever. 30 tablet 0  . aspirin 81 MG chewable tablet Chew 1 tablet (81 mg total) by mouth daily. 30 tablet 0  . atorvastatin (LIPITOR) 80 MG tablet Take 1 tablet (80 mg total) by mouth daily at 6 PM. 30 tablet 0  . CALCIUM-MAGNESIUM-ZINC PO Take 1 tablet by mouth daily.    . carvedilol (COREG) 6.25 MG tablet Take 1 tablet (6.25 mg total) by mouth 2 (two) times daily with a meal. 60 tablet 0  . clopidogrel (PLAVIX) 75 MG tablet  Take 1 tablet (75 mg total) by mouth daily. 30 tablet 0  . famotidine (PEPCID) 20 MG tablet Take 1 tablet (20 mg total) by mouth 2 (two) times daily. 60 tablet 0  . insulin aspart (NOVOLOG) 100 UNIT/ML injection Inject 5-10 Units into the skin 3 (three) times daily as needed for high blood sugar.     . insulin glargine (LANTUS) 100  UNIT/ML injection Inject 0.1 mLs (10 Units total) into the skin daily. 10 mL 0  . Lactobacillus (ACIDOPHILUS PO) Take 1-2 tablets by mouth daily.    Marland Kitchen. losartan (COZAAR) 25 MG tablet Take 1 tablet (25 mg total) by mouth daily. 30 tablet 0  . Magnesium 250 MG TABS Take 250 mg by mouth daily.      No facility-administered medications prior to visit.      Allergies:   Review of patient's allergies indicates no known allergies.   Social History   Social History  . Marital status: Divorced    Spouse name: N/A  . Number of children: N/A  . Years of education: N/A   Social History Main Topics  . Smoking status: Former Games developermoker  . Smokeless tobacco: Never Used     Comment: quit smoking in 2010  . Alcohol use No  . Drug use: No  . Sexual activity: Not on file   Other Topics Concern  . Not on file   Social History Narrative  . No narrative on file     Family History:  The patient's ***family history includes Diabetes in her mother; Heart disease in her father.   ROS:   Please see the history of present illness.    ROS All other systems reviewed and are negative.   PHYSICAL EXAM:   VS:  There were no vitals taken for this visit.   GEN: Well nourished, well developed, in no acute distress  HEENT: normal  Neck: no JVD, carotid bruits, or masses Cardiac: ***RRR; no murmurs, rubs, or gallops,no edema  Respiratory:  clear to auscultation bilaterally, normal work of breathing GI: soft, nontender, nondistended, + BS MS: no deformity or atrophy  Skin: warm and dry, no rash Neuro:  Alert and Oriented x 3, Strength and sensation are intact Psych: euthymic mood, full affect  Wt Readings from Last 3 Encounters:  04/12/16 161 lb 6.4 oz (73.2 kg)  02/29/16 149 lb 1.6 oz (67.6 kg)  02/28/16 146 lb (66.2 kg)      Studies/Labs Reviewed:   EKG:  EKG is*** ordered today.  The ekg ordered today demonstrates ***  Recent Labs: 04/10/2016: ALT 13 04/11/2016: TSH 0.630 04/12/2016:  Hemoglobin 10.0; Platelets 382 04/13/2016: BUN 12; Creatinine, Ser 0.63; Magnesium 1.9; Potassium 3.7; Sodium 138   Lipid Panel    Component Value Date/Time   CHOL 257 (H) 04/11/2016 0610   TRIG 134 04/11/2016 0610   HDL 65 04/11/2016 0610   CHOLHDL 4.0 04/11/2016 0610   VLDL 27 04/11/2016 0610   LDLCALC 165 (H) 04/11/2016 0610    Additional studies/ records that were reviewed today include:   Cath 04/10/2016 Conclusion     Prox RCA to Dist RCA lesion, 95 %stenosed.  Dist Cx lesion, 90 %stenosed.  Prox Cx lesion, 70 %stenosed.  Mid LAD to Dist LAD lesion, 99 %stenosed.  Dist LAD lesion, 99 %stenosed.  Ost 2nd Diag to 2nd Diag lesion, 90 %stenosed.  1st Diag lesion, 90 %stenosed.  Ost 3rd Mrg lesion, 99 %stenosed.  Ost 4th Mrg lesion, 70 %stenosed.  There is  mild to moderate left ventricular systolic dysfunction.  LV end diastolic pressure is mildly elevated.  The left ventricular ejection fraction is 35-45% by visual estimate.   1. Severe, diffuse, heavily calcified 3 vessel disease in a typical diabetic pattern affecting branches and distal vessels. No clear culprit for unstable angina or myocardial infarction. 2. Mild to moderately decreased LV systolic function with an ejection fraction of 40% with akinesis of the apical segments. 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 However, LAD ischemia cannot be excluded given that the LAD wraps around the apex. 3. Mildly elevated left ventricular end-diastolic pressure.     Brain MRI 04/11/2016  IMPRESSION: 1. Focus of diffusion restriction in subcortical white matter at the left frontal parietal junction consistent with acute/early subacute infarct. 2. Focus of diffusion restriction within the left splenium of corpus callosum with different ADC value from the left frontal parietal lesion. This is probably an additional infarct but somewhat atypical. Follow-up MRI of the brain is recommended  to ensure resolution of diffusion abnormality and exclude the possibility of underlying neoplasm. 3. Background of advanced chronic microvascular ischemic changes and mild parenchymal volume loss.   Echo 04/13/2016 LV EF: 40% -   45%   - Left ventricle: The cavity size was normal. There was mild   concentric hypertrophy. Systolic function was mildly to   moderately reduced. The estimated ejection fraction was in the   range of 40% to 45%. Diastolic dysfunction, grade indeterminate.   Elevated filling pressures. - Regional wall motion abnormality: Akinesis of the apical inferior   and apical myocardium; severe hypokinesis of the basal inferior,   apical septal, and apical lateral myocardium; moderate   hypokinesis of the mid inferoseptal, mid inferior, and mid   anterolateral myocardium; mild hypokinesis of the basal   anterolateral myocardium. - Mitral valve: Mildly calcified annulus. There was moderate   regurgitation. - Left atrium: The atrium was moderately dilated.    ASSESSMENT:    1. Coronary artery disease involving native coronary artery of native heart without angina pectoris   2. Cerebrovascular accident (CVA) due to embolism of left anterior cerebral artery (HCC)   3. Essential hypertension   4. Controlled type 2 diabetes mellitus with complication, without long-term current use of insulin (HCC)      PLAN:  In order of problems listed above:  1. ***    Medication Adjustments/Labs and Tests Ordered: Current medicines are reviewed at length with the patient today.  Concerns regarding medicines are outlined above.  Medication changes, Labs and Tests ordered today are listed in the Patient Instructions below. There are no Patient Instructions on file for this visit.   Ramond DialSigned, Cerrone Debold, GeorgiaPA  05/01/2016 6:30 AM    [facility] [address], [place], [place]  [address] Phone: [phone]; Fax: [phone]    This encounter was  created in error - please disregard.

## 2016-05-05 MED FILL — LANTUS SOLOSTAR 100 UNITS/M: 100 | 30 days supply | Qty: 3 | Fill #0

## 2016-05-06 ENCOUNTER — Encounter: Payer: Self-pay | Admitting: Physician Assistant

## 2016-05-06 ENCOUNTER — Ambulatory Visit (INDEPENDENT_AMBULATORY_CARE_PROVIDER_SITE_OTHER): Payer: 59 | Admitting: Physician Assistant

## 2016-05-06 ENCOUNTER — Ambulatory Visit: Payer: 59 | Admitting: *Deleted

## 2016-05-06 VITALS — BP 160/100 | HR 81 | Ht 64.0 in | Wt 153.4 lb

## 2016-05-06 DIAGNOSIS — Z794 Long term (current) use of insulin: Secondary | ICD-10-CM

## 2016-05-06 DIAGNOSIS — Z79899 Other long term (current) drug therapy: Secondary | ICD-10-CM

## 2016-05-06 DIAGNOSIS — I10 Essential (primary) hypertension: Secondary | ICD-10-CM | POA: Insufficient documentation

## 2016-05-06 DIAGNOSIS — E118 Type 2 diabetes mellitus with unspecified complications: Secondary | ICD-10-CM | POA: Diagnosis not present

## 2016-05-06 DIAGNOSIS — E785 Hyperlipidemia, unspecified: Secondary | ICD-10-CM

## 2016-05-06 DIAGNOSIS — I63412 Cerebral infarction due to embolism of left middle cerebral artery: Secondary | ICD-10-CM

## 2016-05-06 DIAGNOSIS — I255 Ischemic cardiomyopathy: Secondary | ICD-10-CM | POA: Diagnosis not present

## 2016-05-06 DIAGNOSIS — I2510 Atherosclerotic heart disease of native coronary artery without angina pectoris: Secondary | ICD-10-CM | POA: Diagnosis not present

## 2016-05-06 LAB — BASIC METABOLIC PANEL
BUN: 16 mg/dL (ref 7–25)
CALCIUM: 9.7 mg/dL (ref 8.6–10.4)
CO2: 31 mmol/L (ref 20–31)
CREATININE: 0.72 mg/dL (ref 0.50–1.05)
Chloride: 102 mmol/L (ref 98–110)
Glucose, Bld: 124 mg/dL — ABNORMAL HIGH (ref 65–99)
Potassium: 4.1 mmol/L (ref 3.5–5.3)
Sodium: 138 mmol/L (ref 135–146)

## 2016-05-06 LAB — CBC
HCT: 39.9 % (ref 35.0–45.0)
HEMOGLOBIN: 13 g/dL (ref 11.7–15.5)
MCH: 28.7 pg (ref 27.0–33.0)
MCHC: 32.6 g/dL (ref 32.0–36.0)
MCV: 88.1 fL (ref 80.0–100.0)
MPV: 10.5 fL (ref 7.5–12.5)
PLATELETS: 352 10*3/uL (ref 140–400)
RBC: 4.53 MIL/uL (ref 3.80–5.10)
RDW: 15.4 % — AB (ref 11.0–15.0)
WBC: 4.5 10*3/uL (ref 3.8–10.8)

## 2016-05-06 MED ORDER — LOSARTAN POTASSIUM 50 MG PO TABS: 50.0000 mg | ORAL_TABLET | Freq: Every day | ORAL | 11 refills | Status: DC

## 2016-05-06 MED ORDER — FAMOTIDINE 20 MG PO TABS: 20.0000 mg | ORAL_TABLET | Freq: Two times a day (BID) | ORAL | 11 refills | Status: AC

## 2016-05-06 MED ORDER — LOSARTAN POTASSIUM 25 MG PO TABS: 25.0000 mg | ORAL_TABLET | Freq: Every day | ORAL | 11 refills | Status: DC

## 2016-05-06 MED ORDER — CLOPIDOGREL BISULFATE 75 MG PO TABS: 75.0000 mg | ORAL_TABLET | Freq: Every day | ORAL | 11 refills | Status: DC

## 2016-05-06 MED ORDER — CARVEDILOL 12.5 MG PO TABS: 12.5000 mg | ORAL_TABLET | Freq: Two times a day (BID) | ORAL | 11 refills | Status: DC

## 2016-05-06 MED ORDER — ASPIRIN 81 MG PO CHEW: 81.0000 mg | CHEWABLE_TABLET | Freq: Every day | ORAL | 11 refills | Status: AC

## 2016-05-06 MED ORDER — ATORVASTATIN CALCIUM 80 MG PO TABS: 80.0000 mg | ORAL_TABLET | Freq: Every day | ORAL | 11 refills | Status: DC

## 2016-05-06 MED FILL — LOSARTAN POTASSIUM 50 MG TA: 50 | 90 days supply | Qty: 90 | Fill #0

## 2016-05-06 MED FILL — CARVEDILOL 12.5 MG TABLET: 12.5 | 90 days supply | Qty: 180 | Fill #0

## 2016-05-06 MED FILL — ATORVASTATIN 80 MG TABLET: 80 | 90 days supply | Qty: 90 | Fill #0

## 2016-05-06 MED FILL — CLOPIDOGREL 75 MG TABLET: 75 | 90 days supply | Qty: 90 | Fill #0

## 2016-05-06 MED FILL — ASPIR-LOW EC 81 MG TABLET: 81 | 90 days supply | Qty: 90 | Fill #0

## 2016-05-06 MED FILL — FAMOTIDINE 20 MG TABLET: 20 | 90 days supply | Qty: 180 | Fill #0

## 2016-05-06 NOTE — Patient Instructions (Signed)
Medication Instructions: Azalee CourseHao Meng, PA-C, has recommended making the following medication changes: 1. INCREASE Carvedilol to 12.5 mg twice daily 2. INCREASE Losartan to 50 mg daily  Labwork: Your physician recommends that you return for lab work TODAY.  Your physician recommends that you return for lab work in 3 months - FASTING.  Testing/Procedures: NONE ORDERED  Follow-up: Your physician recommends that you schedule a [facility]  Wynema BirchHao recommends that you schedule a follow-up appointment in 3 months with Dr Herbie BaltimoreHarding.  If you need a refill on your cardiac medications before your next appointment, please call your pharmacy.   Your physician has requested that you regularly monitor and record your blood pressure readings at home. Please use the same machine at the same time of day to check your readings and record them to bring to your follow-up visit.

## 2016-05-06 NOTE — Progress Notes (Signed)
Cardiology Office Note    Date:  05/06/2016   ID:  Rebecca Wagner, DOB 19-Oct-1960, MRN [id number]  PCP:  Karie ChimeraEESE,BETTI D, MD  Cardiologist:  Dr. Herbie BaltimoreHarding  Chief Complaint  Patient presents with  . Hospitalization Follow-up    seen for Dr. Herbie BaltimoreHarding, post hospital, CVA and cath showed 3v CAD on medical treatment    History of Present Illness:  Rebecca Wagner is a 55 y.o. female with PMH of hypertension, diabetes mellitus and arthritis who presented on 04/10/2016 as a Code Stroke and also Code STEMI. She works at [employer]. During work, she had a sudden onset of dizziness and began drag her feet around. Code stroke was called, blood pressure at the time was 200/120. She was severely dyspneic and diaphoretic, and intubated in the ED. EKG showed ST elevation in the inferior and lateral leads. She underwent emergent cardiac catheterization which showed severe diffuse heavily calcified three-vessel disease yet typical diabetic pattern however no clear culprit was found. Ejection fraction at that time was 35-45%. It was felt that it is likely that patient developed left pulmonary edema in the setting of uncontrolled hypertension. She was placed on nitroglycerin drip. Due to persistent elevated blood pressure, there was concern about a neurological event. MRI of the brain obtained on 04/11/2016 showed acute/early subacute infarct in the left frontoparietal junction and also left splenium of corpus callosum. She was extubated on the same day. Echocardiogram obtained on 04/13/2016 showed EF 40-45%, indeterminate diastolic dysfunction, mild LVH, akinesis of the apical inferior and apical myocardium, severe hypokinesis of the basal inferior, apical septal, and apical left myocardium, moderate hypokinesis of mid inferoseptal, mid inferior and mid anterolateral myocardium, moderate MR. She was discharged on carvedilol, losartan, statin, aspirin and Plavix.  Patient presents today for cardiology  visit, she denies any chest pain or shortness breath since discharge. She initially had trace amount of leg edema, this has resolved. Her blood pressure however persistently to be elevated. She was seen by her PCP last week who have adjusted her insulin dose as she had uncontrolled diabetes in the hospital with hemoglobin A1C of 12.7. She denies any discomfort including dizziness, weakness or presyncope.    Past Medical History:  Diagnosis Date  . Arthritis   . Depression   . Diabetes mellitus without complication (HCC)   . Hypertension     Past Surgical History:  Procedure Laterality Date  . CARDIAC CATHETERIZATION N/A 04/10/2016   Procedure: Left Heart Cath and Coronary Angiography;  Surgeon: Iran OuchMuhammad A Arida, MD;  Location: [facility] INVASIVE CV [facility];  Service: Cardiovascular;  Laterality: N/A;  . CESAREAN SECTION    . LAPAROSCOPIC APPENDECTOMY N/A 02/29/2016   Procedure: LAPAROSCOPIC APPENDECTOMY;  Surgeon: Axel FillerArmando Ramirez, MD;  Location: [facility] OR;  Service: General;  Laterality: N/A;    Current Medications: Outpatient Medications Prior to Visit  Medication Sig Dispense Refill  . acetaminophen (TYLENOL) 325 MG tablet Take 2 tablets (650 mg total) by mouth every 6 (six) hours as needed for fever. 30 tablet 0  . CALCIUM-MAGNESIUM-ZINC PO Take 1 tablet by mouth daily.    . insulin aspart (NOVOLOG) 100 UNIT/ML injection Inject 5-10 Units into the skin 3 (three) times daily as needed for high blood sugar.     . insulin glargine (LANTUS) 100 UNIT/ML injection Inject 0.1 mLs (10 Units total) into the skin daily. 10 mL 0  . Lactobacillus (ACIDOPHILUS PO) Take 1-2 tablets by mouth daily.    . Magnesium 250 MG TABS Take  250 mg by mouth daily.     Marland Kitchen aspirin 81 MG chewable tablet Chew 1 tablet (81 mg total) by mouth daily. 30 tablet 0  . atorvastatin (LIPITOR) 80 MG tablet Take 1 tablet (80 mg total) by mouth daily at 6 PM. 30 tablet 0  . carvedilol (COREG) 6.25 MG tablet Take 1 tablet (6.25 mg total) by  mouth 2 (two) times daily with a meal. 60 tablet 0  . clopidogrel (PLAVIX) 75 MG tablet Take 1 tablet (75 mg total) by mouth daily. 30 tablet 0  . famotidine (PEPCID) 20 MG tablet Take 1 tablet (20 mg total) by mouth 2 (two) times daily. 60 tablet 0  . losartan (COZAAR) 25 MG tablet Take 1 tablet (25 mg total) by mouth daily. 30 tablet 0   No facility-administered medications prior to visit.      Allergies:   Patient has no known allergies.   Social History   Social History  . Marital status: Divorced    Spouse name: N/A  . Number of children: N/A  . Years of education: N/A   Social History Main Topics  . Smoking status: Former Games developer  . Smokeless tobacco: Never Used     Comment: quit smoking in 2010  . Alcohol use No  . Drug use: No  . Sexual activity: Not Asked   Other Topics Concern  . None   Social History Narrative  . None     Family History:  The patient's family history includes Diabetes in her mother; Heart disease in her father.   ROS:   Please see the history of present illness.    ROS All other systems reviewed and are negative.   PHYSICAL EXAM:   VS:  BP (!) 160/100 (BP Location: Right Arm, Patient Position: Sitting, Cuff Size: Normal)   Pulse 81   Ht 5\' 4"  (1.626 m)   Wt 153 lb 6.4 oz (69.6 kg)   BMI 26.33 kg/m    GEN: Well nourished, well developed, in no acute distress  HEENT: normal  Neck: no JVD, carotid bruits, or masses Cardiac: RRR; no murmurs, rubs, or gallops,no edema  Respiratory:  clear to auscultation bilaterally, normal work of breathing GI: soft, nontender, nondistended, + BS MS: no deformity or atrophy  Skin: warm and dry, no rash Neuro:  Alert and Oriented x 3, Strength and sensation are intact Psych: euthymic mood, full affect  Wt Readings from Last 3 Encounters:  05/06/16 153 lb 6.4 oz (69.6 kg)  04/12/16 161 lb 6.4 oz (73.2 kg)  02/29/16 149 lb 1.6 oz (67.6 kg)      Studies/Labs Reviewed:   EKG:  EKG is ordered today.   The ekg ordered today demonstrates Normal sinus rhythm with T-wave inversion in lead 1, aVL and V6.  Recent Labs: 04/10/2016: ALT 13 04/11/2016: TSH 0.630 04/12/2016: Hemoglobin 10.0; Platelets 382 04/13/2016: BUN 12; Creatinine, Ser 0.63; Magnesium 1.9; Potassium 3.7; Sodium 138   Lipid Panel    Component Value Date/Time   CHOL 257 (H) 04/11/2016 0610   TRIG 134 04/11/2016 0610   HDL 65 04/11/2016 0610   CHOLHDL 4.0 04/11/2016 0610   VLDL 27 04/11/2016 0610   LDLCALC 165 (H) 04/11/2016 0610    Additional studies/ records that were reviewed today include:   Cath 04/10/2016  Conclusion     Prox RCA to Dist RCA lesion, 95 %stenosed.  Dist Cx lesion, 90 %stenosed.  Prox Cx lesion, 70 %stenosed.  Mid LAD to Dist LAD lesion,  99 %stenosed.  Dist LAD lesion, 99 %stenosed.  Ost 2nd Diag to 2nd Diag lesion, 90 %stenosed.  1st Diag lesion, 90 %stenosed.  Ost 3rd Mrg lesion, 99 %stenosed.  Ost 4th Mrg lesion, 70 %stenosed.  There is mild to moderate left ventricular systolic dysfunction.  LV end diastolic pressure is mildly elevated.  The left ventricular ejection fraction is 35-45% by visual estimate.   1. Severe, diffuse, heavily calcified 3 vessel disease in a typical diabetic pattern affecting branches and distal vessels. No clear culprit for unstable angina or myocardial infarction. 2. Mild to moderately decreased LV systolic function with an ejection fraction of 40% with akinesis of the apical segments. 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 However, LAD ischemia cannot be excluded given that the LAD wraps around the apex. 3. Mildly elevated left ventricular end-diastolic pressure.  Recommendations: No clear culprit for unstable angina is identified. It's possible that the patient developed flash pulmonary edema in the setting of uncontrolled hypertension with underlying three-vessel coronary artery disease. Recommend blood pressure control  with nitroglycerin drip. The LVEDP was actually close to normal when her blood pressure was controlled after she was given one dose of labetalol. I'm still concerned about a possible neurologic event given her labile blood pressure. The patient has no good options for revascularization of her coronary artery disease given poor targets and diffuse disease.     MRI Brain 04/11/2016 IMPRESSION: 1. Focus of diffusion restriction in subcortical white matter at the left frontal parietal junction consistent with acute/early subacute infarct. 2. Focus of diffusion restriction within the left splenium of corpus callosum with different ADC value from the left frontal parietal lesion. This is probably an additional infarct but somewhat atypical. Follow-up MRI of the brain is recommended to ensure resolution of diffusion abnormality and exclude the possibility of underlying neoplasm. 3. Background of advanced chronic microvascular ischemic changes and mild parenchymal volume loss.    Echo 04/13/2016 LV EF: 40% -   45%  ------------------------------------------------------------------- Indications:      CAD of native vessels 414.01.  ------------------------------------------------------------------- History:   Risk factors:  Hypertension. Diabetes mellitus.  ------------------------------------------------------------------- Study Conclusions  - Left ventricle: The cavity size was normal. There was mild   concentric hypertrophy. Systolic function was mildly to   moderately reduced. The estimated ejection fraction was in the   range of 40% to 45%. Diastolic dysfunction, grade indeterminate.   Elevated filling pressures. - Regional wall motion abnormality: Akinesis of the apical inferior   and apical myocardium; severe hypokinesis of the basal inferior,   apical septal, and apical lateral myocardium; moderate   hypokinesis of the mid inferoseptal, mid inferior, and mid    anterolateral myocardium; mild hypokinesis of the basal   anterolateral myocardium. - Mitral valve: Mildly calcified annulus. There was moderate   regurgitation. - Left atrium: The atrium was moderately dilated.   ASSESSMENT:    1. Coronary artery disease involving native coronary artery of native heart without angina pectoris   2. Cardiomyopathy, ischemic   3. Essential hypertension   4. Type 2 diabetes mellitus with complication, with long-term current use of insulin (HCC)   5. Medication management   6. Dyslipidemia   7. Cerebrovascular accident (CVA) due to embolism of left middle cerebral artery (HCC)      PLAN:  In order of problems listed above:  1. 3v CAD  - Due to EKG changes, she underwent emergent cardiac catheterization on 04/10/2016, she has triple vessel disease, however no culprit lesion  was identified to explain her symptoms. She has classic diabetes appearing coronary vessels, due to diffuse disease, it was not amenable to PCI or bypass surgery.  - Continue on aspirin, high-dose Lipitor, carvedilol, Plavix and the losartan. Obtain basic metabolic panel and CBC.  2. CVA: Followed by neurology, has appointment in December  3. ICM: Ejection fraction 40-45% based on echocardiogram, continue on carvedilol and losartan, uptitrate if blood pressure allows.  4. Uncontrolled DM: Hemoglobin A1C 12.7 on 04/10/2016, insulin dosage has been adjusted by PCP.  5. HLD: Plan to obtain fasting lipid panel and LFTs in 2 months.  6. Uncontrolled HTN: Blood pressure uncontrolled, will increase carvedilol to 12.5 mg twice a day and losartan to 50 mg daily. If blood pressure still high on follow-up, we'll increase carvedilol to 25 mg twice a day and add spironolactone.    Medication Adjustments/Labs and Tests Ordered: Current medicines are reviewed at length with the patient today.  Concerns regarding medicines are outlined above.  Medication changes, Labs and Tests ordered today are  listed in the Patient Instructions below. Patient Instructions  Medication Instructions: Azalee CourseHao Jiah Bari, PA-C, has recommended making the following medication changes: 1. INCREASE Carvedilol to 12.5 mg twice daily 2. INCREASE Losartan to 50 mg daily  Labwork: Your physician recommends that you return for lab work TODAY.  Your physician recommends that you return for lab work in 3 months - FASTING.  Testing/Procedures: NONE ORDERED  Follow-up: Your physician recommends that you schedule a follow-[facility]  Wynema BirchHao recommends that you schedule a follow-up appointment in 3 months with Dr Herbie BaltimoreHarding.  If you need a refill on your cardiac medications before your next appointment, please call your pharmacy.   Your physician has requested that you regularly monitor and record your blood pressure readings at home. Please use the same machine at the same time of day to check your readings and record them to bring to your follow-up visit.    Ramond DialSigned, Jesus Poplin, [place]  05/06/2016 11:51 AM    [facility] [address], [place], [place]  [address] Phone: [phone]; Fax: [phone]

## 2016-05-07 ENCOUNTER — Telehealth: Payer: Self-pay | Admitting: Cardiology

## 2016-05-07 NOTE — Telephone Encounter (Signed)
Unable to reach patient when dialed. 

## 2016-05-07 NOTE — Telephone Encounter (Signed)
Rebecca Wagner is calling to see if she can take Aleve. If she does not answer , please leave a message to whether she can take it or not ( have another Doctor's appt) .  Thanks

## 2016-05-08 NOTE — Telephone Encounter (Signed)
Unable to reach patient at this number. It goes directly to a message stating wireless caller is not available. No means to leave voicemail.

## 2016-05-13 ENCOUNTER — Encounter: Payer: 59 | Attending: Family Medicine | Admitting: *Deleted

## 2016-05-13 ENCOUNTER — Encounter: Payer: Self-pay | Admitting: *Deleted

## 2016-05-13 DIAGNOSIS — Z794 Long term (current) use of insulin: Secondary | ICD-10-CM

## 2016-05-13 DIAGNOSIS — Z713 Dietary counseling and surveillance: Secondary | ICD-10-CM | POA: Insufficient documentation

## 2016-05-13 DIAGNOSIS — E119 Type 2 diabetes mellitus without complications: Secondary | ICD-10-CM

## 2016-05-13 NOTE — Progress Notes (Signed)
Diabetes Self-Management Education  Visit Type: First/Initial  Appt. Start Time: 1100 Appt. End Time: 1200  05/13/2016  [name], identified by name and date of birth, is a 55 y.o. female with a diagnosis of Diabetes: Type 2.   ASSESSMENT  She is a [employer] nurse who is [facility]      Diabetes Self-Management Education - 05/13/16 1109      Visit Information   Visit Type First/Initial     Initial Visit   Diabetes Type Type 2   Are you currently following a meal plan? No   Are you taking your medications as prescribed? Yes   Date Diagnosed 1992     Health Coping   How would you rate your overall health? Fair     Psychosocial Assessment   Patient Belief/Attitude about Diabetes Motivated to manage diabetes   Self-care barriers None   Self-management support Doctor's office   Other persons present Patient   Patient Concerns Nutrition/Meal planning   Special Needs None   Preferred Learning Style No preference indicated   Learning Readiness Ready   How often do you need to have someone help you when you read instructions, pamphlets, or other written materials from your doctor or pharmacy? 1 - Never   What is the last grade level you completed in school? bacheor     Pre-Education Assessment   Patient understands the diabetes disease and treatment process. Needs Instruction   Patient understands incorporating nutritional management into lifestyle. Needs Instruction   Patient undertands incorporating physical activity into lifestyle. Needs Instruction   Patient understands using medications safely. Needs Instruction   Patient understands monitoring blood glucose, interpreting and using results Needs Instruction   Patient understands prevention, detection, and treatment of acute complications. Needs Instruction   Patient understands prevention, detection, and treatment of chronic complications. Needs Instruction   Patient understands how to  develop strategies to address psychosocial issues. Needs Instruction   Patient understands how to develop strategies to promote health/change behavior. Needs Instruction     Complications   Last HgB A1C per patient/outside source 12.6 %   How often do you check your blood sugar? 3-4 times/day   Fasting Blood glucose range (mg/dL) [phone]   Postprandial Blood glucose range (mg/dL) [phone]   Number of hypoglycemic episodes per month 0   Have you had a dilated eye exam in the past 12 months? No   Have you had a dental exam in the past 12 months? No   Are you checking your feet? Yes   How many days per week are you checking your feet? 5     Dietary Intake   Breakfast grits, 2 sausage patties   Lunch none   Dinner 2 slices roast, mashed pot, cabbage, corn bread   Beverage(s) water and sparkling water     Exercise   Exercise Type ADL's     Patient Education   Previous Diabetes Education Yes (please comment)  THN   Disease state  Factors that contribute to the development of diabetes;Explored patient's options for treatment of their diabetes   Nutrition management  Role of diet in the treatment of diabetes and the relationship between the three main macronutrients and blood glucose level;Other (comment)  MyPlate, balance, permission   Physical activity and exercise  Role of exercise on diabetes management, blood pressure control and cardiac health.   Medications Other (comment)  suggested referral to endo for optimal medication management   Monitoring Taught/discussed  recording of test results and interpretation of SMBG.;Yearly dilated eye exam   Psychosocial adjustment Brainstormed with patient on coping mechanisms for social situations, getting support from significant others, dealing with feelings about diabetes;Identified and addressed patients feelings and concerns about diabetes   Personal strategies to promote health Lifestyle issues that need to be addressed for better diabetes  care     Individualized Goals (developed by patient)   Nutrition General guidelines for healthy choices and portions discussed   Physical Activity Exercise 3-5 times per week   Medications Other (comment)  work with specialist   Monitoring  Other (comment)  record glucose values on log     Post-Education Assessment   Patient understands the diabetes disease and treatment process. Needs Review   Patient understands incorporating nutritional management into lifestyle. Needs Review   Patient undertands incorporating physical activity into lifestyle. Needs Review   Patient understands using medications safely. Needs Review   Patient understands monitoring blood glucose, interpreting and using results Needs Review   Patient understands prevention, detection, and treatment of acute complications. Needs Instruction   Patient understands prevention, detection, and treatment of chronic complications. Needs Instruction   Patient understands how to develop strategies to address psychosocial issues. Needs Review   Patient understands how to develop strategies to promote health/change behavior. Needs Review     Outcomes   Expected Outcomes Other (comment)  struggled to retain information   Future DMSE PRN   Program Status Not Completed      Individualized Plan for Diabetes Self-Management Training:   Learning Objective:  Patient will have a greater understanding of diabetes self-management. Patient education plan is to attend individual and/or group sessions per assessed needs and concerns.   Plan:   Patient Instructions  [facility]   [facility] [place] Address: [address], [place], [place] [address] Hours: Open today  8AM-5PM Phone: [phone](336) [phone]  Call your dentist  Call endocrinologist Try to increase exercise Eat 3 meals/day with protein, carbohydrates, and veggies Drink plenty of water Follow the plate picture    Expected Outcomes:   Other (comment) (struggled to retain information)  Secretary/administratorducation material provided: Living Well with Diabetes, Meal plan card and Support group flyer  If problems or questions, patient to contact team via:  Phone  Future DSME appointment: PRN

## 2016-05-13 NOTE — Patient Instructions (Addendum)
[facility]   [facility] [place] Address: [address], [place], [place] [address] Hours: Open today  8AM-5PM Phone: [phone]  Call your dentist  Call endocrinologist Try to increase exercise Eat 3 meals/day with protein, carbohydrates, and veggies Drink plenty of water Follow the plate picture

## 2016-05-16 DIAGNOSIS — Z760 Encounter for issue of repeat prescription: Secondary | ICD-10-CM | POA: Diagnosis not present

## 2016-05-16 DIAGNOSIS — I10 Essential (primary) hypertension: Secondary | ICD-10-CM | POA: Diagnosis not present

## 2016-05-16 DIAGNOSIS — E119 Type 2 diabetes mellitus without complications: Secondary | ICD-10-CM | POA: Diagnosis not present

## 2016-05-16 DIAGNOSIS — I679 Cerebrovascular disease, unspecified: Secondary | ICD-10-CM | POA: Diagnosis not present

## 2016-05-16 MED FILL — HYDROCHLOROTHIAZIDE 12.5 MG: 12.5 | 90 days supply | Qty: 90 | Fill #0

## 2016-05-22 ENCOUNTER — Telehealth: Payer: Self-pay | Admitting: Physician Assistant

## 2016-05-22 NOTE — Telephone Encounter (Signed)
New message      Returning a call to the nurse to get lab results.  OK to leave message on vm

## 2016-05-22 NOTE — Telephone Encounter (Signed)
Per result note: Kidney function and electrolyte stable, her anemia has resolved, red blood cell count normalized  LMTCB

## 2016-05-28 NOTE — Telephone Encounter (Signed)
lmtcb

## 2016-05-29 NOTE — Telephone Encounter (Signed)
LMTCB, unable to contact will await pt to return call

## 2016-06-04 DIAGNOSIS — E119 Type 2 diabetes mellitus without complications: Secondary | ICD-10-CM | POA: Diagnosis not present

## 2016-06-04 DIAGNOSIS — I679 Cerebrovascular disease, unspecified: Secondary | ICD-10-CM | POA: Diagnosis not present

## 2016-06-04 DIAGNOSIS — I10 Essential (primary) hypertension: Secondary | ICD-10-CM | POA: Diagnosis not present

## 2016-06-10 ENCOUNTER — Encounter: Payer: 59 | Admitting: Physician Assistant

## 2016-06-10 ENCOUNTER — Ambulatory Visit: Payer: 59 | Admitting: Physician Assistant

## 2016-06-10 ENCOUNTER — Ambulatory Visit: Payer: 59 | Admitting: Neurology

## 2016-06-10 ENCOUNTER — Encounter: Payer: Self-pay | Admitting: *Deleted

## 2016-06-10 NOTE — Progress Notes (Signed)
This encounter was created in error - please disregard.

## 2016-06-10 NOTE — Progress Notes (Deleted)
Cardiology Office Note    Date:  06/10/2016   ID:  Rebecca Wagner, DOB Sep 11, 1960, MRN [id number]  PCP:  Karie ChimeraEESE,BETTI D, MD  Cardiologist:  Dr. Herbie BaltimoreHarding   Chief Complaint  Patient presents with  . Follow-up    seen for Dr. Herbie BaltimoreHarding, 3v CAD and CVA, uncontrolled BP and diabetes    History of Present Illness:  Rebecca Wagner is a 55 y.o. female  PMH of hypertension, diabetes mellitus and arthritis who presented on 04/10/2016 as a Code Stroke and also Code STEMI. She works at [employer]. During work, she had a sudden onset of dizziness and began drag her feet around. Code Stroke was called, blood pressure at the time was 200/120. She was severely dyspneic and diaphoretic, and intubated in the ED. EKG showed ST elevation in the inferior and lateral leads. She underwent emergent cardiac catheterization which showed severe diffuse heavily calcified three-vessel disease with typical diabetic pattern however no clear culprit was found. Ejection fraction at that time was 35-45%. It was felt that it is likely that patient developed left pulmonary edema in the setting of uncontrolled hypertension. She was placed on nitroglycerin drip. Due to persistent elevated blood pressure, there was concern about a neurological event. MRI of the brain obtained on 04/11/2016 showed acute/early subacute infarct in the left frontoparietal junction and also left splenium of corpus callosum. She was extubated on the same day. Echocardiogram obtained on 04/13/2016 showed EF 40-45%, indeterminate diastolic dysfunction, mild LVH, akinesis of the apical inferior and apical myocardium, severe hypokinesis of the basal inferior, apical septal, and apical left myocardium, moderate hypokinesis of mid inferoseptal, mid inferior and mid anterolateral myocardium, moderate MR. She was discharged on carvedilol, losartan, statin, aspirin and Plavix. I did increase her carvedilol and losartan to better manage her blood pressure.  I also recommended her to continue to follow-up with her PCP given uncontrolled diabetes with hemoglobin A1C of 12.7 on 10/11.   She presents today for follow-up.     Past Medical History:  Diagnosis Date  . Arthritis   . Depression   . Diabetes mellitus without complication (HCC)   . Hypertension     Past Surgical History:  Procedure Laterality Date  . CARDIAC CATHETERIZATION N/A 04/10/2016   Procedure: Left Heart Cath and Coronary Angiography;  Surgeon: Iran OuchMuhammad A Arida, MD;  Location: [facility] INVASIVE CV LAB;  Service: Cardiovascular;  Laterality: N/A;  . CESAREAN SECTION    . LAPAROSCOPIC APPENDECTOMY N/A 02/29/2016   Procedure: LAPAROSCOPIC APPENDECTOMY;  Surgeon: Axel FillerArmando Ramirez, MD;  Location: [facility] OR;  Service: General;  Laterality: N/A;    Current Medications: Outpatient Medications Prior to Visit  Medication Sig Dispense Refill  . acetaminophen (TYLENOL) 325 MG tablet Take 2 tablets (650 mg total) by mouth every 6 (six) hours as needed for fever. 30 tablet 0  . aspirin 81 MG chewable tablet Chew 1 tablet (81 mg total) by mouth daily. 30 tablet 11  . atorvastatin (LIPITOR) 80 MG tablet Take 1 tablet (80 mg total) by mouth daily at 6 PM. 30 tablet 11  . CALCIUM-MAGNESIUM-ZINC PO Take 1 tablet by mouth daily.    . carvedilol (COREG) 12.5 MG tablet Take 1 tablet (12.5 mg total) by mouth 2 (two) times daily with a meal. 60 tablet 11  . clopidogrel (PLAVIX) 75 MG tablet Take 1 tablet (75 mg total) by mouth daily. 30 tablet 11  . famotidine (PEPCID) 20 MG tablet Take 1 tablet (20 mg total) by mouth 2 (  two) times daily. 60 tablet 11  . insulin aspart (NOVOLOG) 100 UNIT/ML injection Inject 5-10 Units into the skin 3 (three) times daily as needed for high blood sugar.     . insulin glargine (LANTUS) 100 UNIT/ML injection Inject 0.1 mLs (10 Units total) into the skin daily. 10 mL 0  . Lactobacillus (ACIDOPHILUS PO) Take 1-2 tablets by mouth daily.    Marland Kitchen. losartan (COZAAR) 50 MG tablet Take  1 tablet (50 mg total) by mouth daily. 30 tablet 11  . Magnesium 250 MG TABS Take 250 mg by mouth daily.      No facility-administered medications prior to visit.      Allergies:   Patient has no known allergies.   Social History   Social History  . Marital status: Divorced    Spouse name: N/A  . Number of children: N/A  . Years of education: N/A   Social History Main Topics  . Smoking status: Former Games developermoker  . Smokeless tobacco: Never Used     Comment: quit smoking in 2010  . Alcohol use No  . Drug use: No  . Sexual activity: Not on file   Other Topics Concern  . Not on file   Social History Narrative  . No narrative on file     Family History:  The patient's ***family history includes Diabetes in her mother; Heart disease in her father.   ROS:   Please see the history of present illness.    ROS All other systems reviewed and are negative.   PHYSICAL EXAM:   VS:  There were no vitals taken for this visit.   GEN: Well nourished, well developed, in no acute distress  HEENT: normal  Neck: no JVD, carotid bruits, or masses Cardiac: ***RRR; no murmurs, rubs, or gallops,no edema  Respiratory:  clear to auscultation bilaterally, normal work of breathing GI: soft, nontender, nondistended, + BS MS: no deformity or atrophy  Skin: warm and dry, no rash Neuro:  Alert and Oriented x 3, Strength and sensation are intact Psych: euthymic mood, full affect  Wt Readings from Last 3 Encounters:  05/06/16 153 lb 6.4 oz (69.6 kg)  04/12/16 161 lb 6.4 oz (73.2 kg)  02/29/16 149 lb 1.6 oz (67.6 kg)      Studies/Labs Reviewed:   EKG:  EKG is*** ordered today.  The ekg ordered today demonstrates ***  Recent Labs: 04/10/2016: ALT 13 04/11/2016: TSH 0.630 04/13/2016: Magnesium 1.9 05/06/2016: BUN 16; Creat 0.72; Hemoglobin 13.0; Platelets 352; Potassium 4.1; Sodium 138   Lipid Panel    Component Value Date/Time   CHOL 257 (H) 04/11/2016 0610   TRIG 134 04/11/2016 0610    HDL 65 04/11/2016 0610   CHOLHDL 4.0 04/11/2016 0610   VLDL 27 04/11/2016 0610   LDLCALC 165 (H) 04/11/2016 0610    Additional studies/ records that were reviewed today include:   Cath 04/10/2016  Conclusion     Prox RCA to Dist RCA lesion, 95 %stenosed.  Dist Cx lesion, 90 %stenosed.  Prox Cx lesion, 70 %stenosed.  Mid LAD to Dist LAD lesion, 99 %stenosed.  Dist LAD lesion, 99 %stenosed.  Ost 2nd Diag to 2nd Diag lesion, 90 %stenosed.  1st Diag lesion, 90 %stenosed.  Ost 3rd Mrg lesion, 99 %stenosed.  Ost 4th Mrg lesion, 70 %stenosed.  There is mild to moderate left ventricular systolic dysfunction.  LV end diastolic pressure is mildly elevated.  The left ventricular ejection fraction is 35-45% by visual estimate.  1. Severe,  diffuse, heavily calcified 3 vessel disease in a typical diabetic pattern affecting branches and distal vessels. No clear culprit for unstable angina or myocardial infarction. 2. Mild to moderately decreased LV systolic function with an ejection fraction of 40% with akinesis of the apical segments. 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 However, LAD ischemia cannot be excluded given that the LAD wraps around the apex. 3. Mildly elevated left ventricular end-diastolic pressure.  Recommendations: No clear culprit for unstable angina is identified. It's possible that the patient developed flash pulmonary edema in the setting of uncontrolled hypertension with underlying three-vessel coronary artery disease. Recommend blood pressure control with nitroglycerin drip. The LVEDP was actually close to normal when her blood pressure was controlled after she was given one dose of labetalol. I'm still concerned about a possible neurologic event given her labile blood pressure. The patient has no good options for revascularization of her coronary artery disease given poor targets and diffuse disease.     MRI Brain  04/11/2016 IMPRESSION: 1. Focus of diffusion restriction in subcortical white matter at the left frontal parietal junction consistent with acute/early subacute infarct. 2. Focus of diffusion restriction within the left splenium of corpus callosum with different ADC value from the left frontal parietal lesion. This is probably an additional infarct but somewhat atypical. Follow-up MRI of the brain is recommended to ensure resolution of diffusion abnormality and exclude the possibility of underlying neoplasm. 3. Background of advanced chronic microvascular ischemic changes and mild parenchymal volume loss.    Echo 04/13/2016 LV EF: 40% - 45%  ------------------------------------------------------------------- Indications: CAD of native vessels 414.01.  ------------------------------------------------------------------- History: Risk factors: Hypertension. Diabetes mellitus.  ------------------------------------------------------------------- Study Conclusions  - Left ventricle: The cavity size was normal. There was mild concentric hypertrophy. Systolic function was mildly to moderately reduced. The estimated ejection fraction was in the range of 40% to 45%. Diastolic dysfunction, grade indeterminate. Elevated filling pressures. - Regional wall motion abnormality: Akinesis of the apical inferior and apical myocardium; severe hypokinesis of the basal inferior, apical septal, and apical lateral myocardium; moderate hypokinesis of the mid inferoseptal, mid inferior, and mid anterolateral myocardium; mild hypokinesis of the basal anterolateral myocardium. - Mitral valve: Mildly calcified annulus. There was moderate regurgitation. - Left atrium: The atrium was moderately dilated.    ASSESSMENT:    1. Coronary artery disease involving native coronary artery of native heart without angina pectoris   2. Cardiomyopathy, ischemic   3. Essential  hypertension   4. Type 2 diabetes mellitus with diabetic neuropathy, with long-term current use of insulin (HCC)   5. Cerebrovascular accident (CVA) due to embolism of left middle cerebral artery (HCC)   6. Hyperlipidemia, unspecified hyperlipidemia type      PLAN:  In order of problems listed above:  1. ***    Medication Adjustments/Labs and Tests Ordered: Current medicines are reviewed at length with the patient today.  Concerns regarding medicines are outlined above.  Medication changes, Labs and Tests ordered today are listed in the Patient Instructions below. There are no Patient Instructions on file for this visit.   Ramond Dial, [place]  06/10/2016 6:12 AM    [facility] [address] [place], [place], [place]  [address] Phone: [phone]; Fax: [phone]

## 2016-06-11 ENCOUNTER — Encounter: Payer: Self-pay | Admitting: Neurology

## 2016-06-17 MED FILL — LANTUS SOLOSTAR 100 UNITS/M: 100 | 30 days supply | Qty: 3 | Fill #1

## 2016-06-17 MED FILL — TRUE METRIX GLUCOSE TEST ST: 25 days supply | Qty: 100 | Fill #0

## 2016-06-19 DIAGNOSIS — E119 Type 2 diabetes mellitus without complications: Secondary | ICD-10-CM | POA: Diagnosis not present

## 2016-06-19 DIAGNOSIS — I679 Cerebrovascular disease, unspecified: Secondary | ICD-10-CM | POA: Diagnosis not present

## 2016-06-19 DIAGNOSIS — I10 Essential (primary) hypertension: Secondary | ICD-10-CM | POA: Diagnosis not present

## 2016-06-19 DIAGNOSIS — R5383 Other fatigue: Secondary | ICD-10-CM | POA: Diagnosis not present

## 2016-06-19 MED FILL — AMLODIPINE-OLMESARTAN 10-40: 10-40 | 30 days supply | Qty: 30 | Fill #0

## 2016-06-21 MED FILL — CARVEDILOL 25 MG TABLET: 25 | 90 days supply | Qty: 180 | Fill #0

## 2016-06-27 DIAGNOSIS — E119 Type 2 diabetes mellitus without complications: Secondary | ICD-10-CM | POA: Diagnosis not present

## 2016-06-27 DIAGNOSIS — I679 Cerebrovascular disease, unspecified: Secondary | ICD-10-CM | POA: Diagnosis not present

## 2016-06-27 DIAGNOSIS — I10 Essential (primary) hypertension: Secondary | ICD-10-CM | POA: Diagnosis not present

## 2016-06-27 DIAGNOSIS — R5383 Other fatigue: Secondary | ICD-10-CM | POA: Diagnosis not present

## 2016-07-23 MED FILL — LOSARTAN POTASSIUM 100 MG T: 100 | 90 days supply | Qty: 90 | Fill #0

## 2016-07-23 MED FILL — LANTUS SOLOSTAR 100 UNITS/M: 100 | 30 days supply | Qty: 3 | Fill #2

## 2016-07-31 DIAGNOSIS — E119 Type 2 diabetes mellitus without complications: Secondary | ICD-10-CM | POA: Diagnosis not present

## 2016-07-31 DIAGNOSIS — I679 Cerebrovascular disease, unspecified: Secondary | ICD-10-CM | POA: Diagnosis not present

## 2016-07-31 DIAGNOSIS — I10 Essential (primary) hypertension: Secondary | ICD-10-CM | POA: Diagnosis not present

## 2016-07-31 MED FILL — cloNIDine HCL 0.2 MG TABS: 0.2 | 30 days supply | Qty: 60 | Fill #0

## 2016-08-01 MED FILL — ATORVASTATIN 80 MG TABLET: 80 | 90 days supply | Qty: 90 | Fill #1

## 2016-08-01 MED FILL — CLOPIDOGREL 75 MG TABLET: 75 | 90 days supply | Qty: 90 | Fill #1

## 2016-08-08 ENCOUNTER — Encounter: Payer: Self-pay | Admitting: *Deleted

## 2016-08-08 ENCOUNTER — Ambulatory Visit: Payer: 59 | Admitting: Cardiology

## 2016-08-14 DIAGNOSIS — E119 Type 2 diabetes mellitus without complications: Secondary | ICD-10-CM | POA: Diagnosis not present

## 2016-08-14 DIAGNOSIS — I679 Cerebrovascular disease, unspecified: Secondary | ICD-10-CM | POA: Diagnosis not present

## 2016-08-14 DIAGNOSIS — I10 Essential (primary) hypertension: Secondary | ICD-10-CM | POA: Diagnosis not present

## 2016-08-16 MED FILL — FAMOTIDINE 20 MG TABLET: 20 | 90 days supply | Qty: 180 | Fill #1

## 2016-08-16 MED FILL — ASPIR-LOW EC 81 MG TABLET: 81 | 90 days supply | Qty: 90 | Fill #1

## 2016-08-20 MED FILL — UNIFINE PENTIPS 31GX3/16: 31G X 5 MM | 90 days supply | Qty: 100 | Fill #0

## 2016-08-20 MED FILL — traMADol HCL 50 MG TABS: 50 | 30 days supply | Qty: 120 | Fill #0

## 2016-08-20 MED FILL — UNIFINE PENTIPS 31GX3/16": 31G X 5 MM | 90 days supply | Qty: 100 | Fill #0

## 2016-08-20 MED FILL — HYDROCHLOROTHIAZIDE 12.5 MG: 12.5 | 90 days supply | Qty: 90 | Fill #0

## 2016-08-20 MED FILL — LANTUS SOLOSTAR 100 UNITS/M: 100 | 30 days supply | Qty: 3 | Fill #3

## 2016-08-29 ENCOUNTER — Other Ambulatory Visit: Payer: Self-pay | Admitting: *Deleted

## 2016-08-29 ENCOUNTER — Encounter: Payer: Self-pay | Admitting: *Deleted

## 2016-08-29 VITALS — BP 150/100 | Ht 64.0 in | Wt 181.0 lb

## 2016-08-29 DIAGNOSIS — Z794 Long term (current) use of insulin: Principal | ICD-10-CM

## 2016-08-29 DIAGNOSIS — E1165 Type 2 diabetes mellitus with hyperglycemia: Secondary | ICD-10-CM

## 2016-08-29 LAB — POCT GLYCOSYLATED HEMOGLOBIN (HGB A1C): Hemoglobin A1C: 9.6

## 2016-08-29 NOTE — Patient Outreach (Addendum)
[facility]) Care Management   08/29/2016  Rebecca Wagner 01-07-1961 [id number]  Rebecca Wagner is an 56 y.o. female who presents to the [facility] Care Management office to enroll in the Link To Wellness program for self management assistance with Type II DM with macrovascular complications of CAD (s/p MI) and stroke, HTN, CAD and hyperlipidemia.  Subjective:  Rebecca Wagner states she was referred to the Link to Wellness program by the [facility]  Pharmacy and when she was visited by the [facility] CM hospital liaison when she was hospitalized for her stroke and MI in mid October 2017. She says she was also referred to the program by the [facility] Telephonic CM Elmer Picker during her post discharge telephone follow up.  She says her blood sugar is chronically elevated and she reports her most recent Hgb A1C was done while she was hospitalized in October and it was "very high'. She says her Hgb A1C has never been under the target of 7. She says the self management behavior she needs the most help with is CHO controlled meal planning. She says she meet with a registered dietician in November 2017 but does not feel she understood most of what she was taught about meal planning.   Objective:   Review of Systems  All other systems reviewed and are negative.   Physical Exam  Constitutional: She is oriented to person, place, and time. She appears well-developed and well-nourished.  Respiratory: Effort normal.  Neurological: She is alert and oriented to person, place, and time.  Skin: Skin is warm and dry.  Psychiatric: She has a normal mood and affect. Her behavior is normal. Judgment and thought content normal.   BP= 140/90 left arm, 150/100  right arm Weight= 181 lbs POC Hgb A1C= 9.6% (Estimated average glucose= 229)   Encounter Medications:   Outpatient Encounter Prescriptions as of 08/29/2016  Medication Sig  . acetaminophen (TYLENOL) 325 MG tablet Take 2 tablets  (650 mg total) by mouth every 6 (six) hours as needed for fever.  Marland Kitchen aspirin 81 MG chewable tablet Chew 1 tablet (81 mg total) by mouth daily.  Marland Kitchen atorvastatin (LIPITOR) 80 MG tablet Take 1 tablet (80 mg total) by mouth daily at 6 PM.  . CALCIUM-MAGNESIUM-ZINC PO Take 1 tablet by mouth daily.  . carvedilol (COREG) 12.5 MG tablet Take 1 tablet (12.5 mg total) by mouth 2 (two) times daily with a meal.  . clopidogrel (PLAVIX) 75 MG tablet Take 1 tablet (75 mg total) by mouth daily.  . famotidine (PEPCID) 20 MG tablet Take 1 tablet (20 mg total) by mouth 2 (two) times daily.  . insulin glargine (LANTUS) 100 UNIT/ML injection Inject 0.1 mLs (10 Units total) into the skin daily.  Marland Kitchen losartan (COZAAR) 50 MG tablet Take 1 tablet (50 mg total) by mouth daily.  . Magnesium 250 MG TABS Take 250 mg by mouth daily.   . insulin aspart (NOVOLOG) 100 UNIT/ML injection Inject 5-10 Units into the skin 3 (three) times daily as needed for high blood sugar.   . Lactobacillus (ACIDOPHILUS PO) Take 1-2 tablets by mouth daily.   No facility-administered encounter medications on file as of 08/29/2016.     Functional Status:   In your present state of health, do you have any difficulty performing the following activities: 08/29/2016 04/12/2016  Hearing? N N  Vision? N N  Difficulty concentrating or making decisions? N N  Walking or climbing stairs? Y N  Dressing or  bathing? N N  Doing errands, shopping? N N  Preparing Food and eating ? N -  Using the Toilet? N -  In the past six months, have you accidently leaked urine? N -  Do you have problems with loss of bowel control? N -  Managing your Medications? N -  Managing your Finances? N -  Housekeeping or managing your Housekeeping? N -  Some recent data might be hidden    Fall/Depression Screening:    PHQ 2/9 Scores 08/29/2016 05/13/2016 02/28/2016  PHQ - 2 Score 0 0 0   Assessment:   [employer] employee enrolling in the link To wellness program for self  management assistance with chronic disease states.    [facility] CM Care Plan Problem One   Flowsheet Row Most Recent Value  Care Plan Problem One  Patient with Type II DM, CAD, HTN, hyperlipidemia, s/p left frontal parietal stroke and MI enrolling in the Link To Wellness program for chronic disease management assistance  Role Documenting the Problem One  Care Management Coordinator  Care Plan for Problem One  Active  [facility] Long Term Goal (31-90 days)  Improved blood sugar readings with >50% meeting target, improved lipid panel on next assessment, home BP readings average <140/<90, Rebecca Wagner will voice increased knowledge of managing diabetes through CHO control meal planning and will demonstrate weight loss at next assessment.   [facility] Long Term Goal Start Date  08/29/16  Interventions for Problem One Long Term Goal  Discussed Link to Wellness program goals, requirements and benefits, reviewed member's rights and responsibilities ,provided diabetes information packet with explanation of contents, ensured member agreed and signed consent to participate and authorization to release and receive health information, consent, participation agreement and consent to enroll in program, assessed member's current knowledge of diabetes,, reviewed patient's medications and assessed medication adherence, discussed DM medications of Lantus insulin including the mechanism of action, common side effects, dosage and dosing schedule, reinforced importance of taking all medications as prescribed,discussed role of statins, aspirin, and blood pressure medicines in the treatment of diabetes, provided education on the three primary macronutrients (CHO, protein, fat) and their effect on glucose levels,  provided education on carb counting, importance of regularly scheduled meals/snacks, and meal planning, reviewed approximate amount of CHOs to aim for at meals ( 30-45 gm ) and snacks (15 gms), discussed recommended daily amounts of fiber (30 gm)   and protein ( 46 for adult women) and how these two macronutrients help with satiety, used food models as teaching tool to educate patient on portion size and macronutrients, discussed phone applications to use to help with CHO counting when food labels are not available,  reviewed American Diabetes Association recommendations of 150 minutes of exercise per week including two sessions of resistance exercise weekly, reviewed patient's CBG readings, discussed blood glucose monitoring and interpretation, discussed recommended target ranges for pre-meal and post-meal, provided blood sugar log sheets with targets for pre and post meal, reviewed definitions of hyperglycemia and hypoglycemia, assessed patient's hypoglycemia threshold and usual symptoms, discussed causes, other symptoms, and treatment options (Rule of 15s to treat hypoglycemia),assessed and discussed results of today's POC Hgb A1C, Hgb A1C goal, and correlation to estimated average glucose, provided information on: prevention, detection, and treatment of long-term complications, discussed the role of prolonged elevated glucose levels on body systems, discussed recommendations for day to day and long-term diabetes self-care, reviewed recommended daily foot checks, and yearly cholesterol, urine, and eye testing, and recommendations for medical, dental, and emotional  self-care, requested that Rebecca Wagner call to arrange Link To Wellness follow up in one month      Plan:  RNCM will fax today's note to patient's primary care provider. RNCM will plan to meet with patient next month to assist with ongoing chronic disease management assistance and assess progress towards mutually set goals.   Bary RichardJanet S. Hauser RN,CCM,CDE [facility] Care Management Coordinator Link To Wellness Office Phone [phone] Office Fax [phone]

## 2016-08-30 ENCOUNTER — Encounter: Payer: Self-pay | Admitting: *Deleted

## 2016-09-10 DIAGNOSIS — I10 Essential (primary) hypertension: Secondary | ICD-10-CM | POA: Diagnosis not present

## 2016-09-10 DIAGNOSIS — I679 Cerebrovascular disease, unspecified: Secondary | ICD-10-CM | POA: Diagnosis not present

## 2016-09-10 DIAGNOSIS — E119 Type 2 diabetes mellitus without complications: Secondary | ICD-10-CM | POA: Diagnosis not present

## 2016-09-13 MED FILL — LANTUS SOLOSTAR 100 UNITS/M: 100 | 30 days supply | Qty: 3 | Fill #4

## 2016-09-16 MED FILL — ACCU-CHEK GUIDE TEST STRIP: 50 days supply | Qty: 100 | Fill #0

## 2016-09-16 MED FILL — ACCU-CHEK FASTCLIX LANCETS: 50 days supply | Qty: 102 | Fill #0

## 2016-09-19 DIAGNOSIS — E119 Type 2 diabetes mellitus without complications: Secondary | ICD-10-CM | POA: Diagnosis not present

## 2016-09-19 DIAGNOSIS — I10 Essential (primary) hypertension: Secondary | ICD-10-CM | POA: Diagnosis not present

## 2016-09-19 DIAGNOSIS — I679 Cerebrovascular disease, unspecified: Secondary | ICD-10-CM | POA: Diagnosis not present

## 2016-09-19 MED FILL — DICLOFENAC SODIUM 1% GEL: 1 | 30 days supply | Qty: 100 | Fill #0

## 2016-09-19 MED FILL — DILTIAZEM 24HR ER 180 MG TA: 180 | 30 days supply | Qty: 30 | Fill #0

## 2016-09-24 MED FILL — cloNIDine HCL 0.2 MG TABS: 0.2 | 30 days supply | Qty: 60 | Fill #1

## 2016-09-26 ENCOUNTER — Other Ambulatory Visit: Payer: Self-pay | Admitting: *Deleted

## 2016-09-26 DIAGNOSIS — I679 Cerebrovascular disease, unspecified: Secondary | ICD-10-CM | POA: Diagnosis not present

## 2016-09-26 DIAGNOSIS — E119 Type 2 diabetes mellitus without complications: Secondary | ICD-10-CM | POA: Diagnosis not present

## 2016-09-26 DIAGNOSIS — I10 Essential (primary) hypertension: Secondary | ICD-10-CM | POA: Diagnosis not present

## 2016-09-26 MED FILL — UNIFINE PENTIPS 32GX5/32: 32G X 4 MM | 25 days supply | Qty: 100 | Fill #0

## 2016-09-26 MED FILL — HUMALOG 100 UNITS/ML KWIKPE: 100 | 30 days supply | Qty: 9 | Fill #0

## 2016-09-26 MED FILL — UNIFINE PENTIPS 32GX5/32": 32G X 4 MM | 25 days supply | Qty: 100 | Fill #0

## 2016-09-26 MED FILL — LANTUS SOLOSTAR 100 UNITS/M: 100 | 33 days supply | Qty: 15 | Fill #0

## 2016-09-26 NOTE — Patient Outreach (Signed)
Met with Rebecca Wagner for [employer] To Wellness follow up. She will  see her primary care MD today after her Link To Wellness visit. She says her morning blood sugars are usually <150 but her sugars throughout the rest of the day are >200. She continues to say she was told by her provider to give herself Lantus for blood sugars >200 but she says she is fearful of taking extra Lantus, especially at bedtime,  because she worries about going low. We discussed the onset of action and duration of Lantus and encouraged her to speak to her provider about adding another medication that will help with post meal blood sugar spikes. Provided her with a list of the free medications provided under the Link To Wellness pharmacy benefit and suggested she take it to her provider today.  Also provided [name] with a Accu-Chek glucometer, starter kit and 50 test strips at no cost.  Arranged for Link To Wellness follow up on 10/17/16. Barrington Ellison RN,CCM,CDE [name] Management Coordinator Link To Wellness Office Phone [phone] Office Fax [phone]

## 2016-10-02 MED FILL — CARVEDILOL 25 MG TABLET: 25 | 90 days supply | Qty: 180 | Fill #0

## 2016-10-03 MED FILL — LOSARTAN POTASSIUM 100 MG T: 100 | 90 days supply | Qty: 90 | Fill #1

## 2016-10-10 MED FILL — CLOPIDOGREL 75 MG TABLET: 75 | 90 days supply | Qty: 90 | Fill #2

## 2016-10-17 ENCOUNTER — Ambulatory Visit: Payer: Self-pay | Admitting: *Deleted

## 2016-10-22 ENCOUNTER — Other Ambulatory Visit: Payer: Self-pay | Admitting: *Deleted

## 2016-10-22 NOTE — Patient Outreach (Signed)
Left message on contact number requesting [name] contact this RNCM to reschedule her Link To Wellness appointment for 10/24/16 at 9:30 am due to a conflict in this RNCM's schedule. Await response from patient. Bary Richard RN,CCM,CDE [facility] Care Management Coordinator Link To Wellness Office Phone [phone] Office Fax [phone]

## 2016-10-23 ENCOUNTER — Other Ambulatory Visit: Payer: Self-pay | Admitting: *Deleted

## 2016-10-23 NOTE — Patient Outreach (Signed)
A second message left on [name]'s contact number requesting she contact this RNCM to reschedule her 10:00 am Link To Wellness appointment on 4/26 due to a conflict in this RNCM's schedule. Unable to send secure e-mail to her [facility] address because she is not listed on the [facility] [employer] Address list so sent secure e-mail to her personal address with same request. Bary Richard RN,CCM,CDE [facility] Care Management Coordinator Link To Wellness Office Phone [phone] Office Fax [phone]

## 2016-10-23 NOTE — Patient Outreach (Signed)
[name] returned call and left voice message acknowledging receipt of this RNCM's message regarding need to reschedule Link To Wellness visit tomorrow. [name] says she will call to reschedule once she has received her new work schedule. Bary Richard RN,CCM,CDE [facility] Care Management Coordinator Link To Wellness Office Phone [phone] Office Fax [phone]

## 2016-10-24 ENCOUNTER — Ambulatory Visit: Payer: Self-pay | Admitting: *Deleted

## 2016-10-24 DIAGNOSIS — R5381 Other malaise: Secondary | ICD-10-CM | POA: Diagnosis not present

## 2016-10-24 DIAGNOSIS — I10 Essential (primary) hypertension: Secondary | ICD-10-CM | POA: Diagnosis not present

## 2016-10-24 DIAGNOSIS — E119 Type 2 diabetes mellitus without complications: Secondary | ICD-10-CM | POA: Diagnosis not present

## 2016-10-24 DIAGNOSIS — I679 Cerebrovascular disease, unspecified: Secondary | ICD-10-CM | POA: Diagnosis not present

## 2016-10-24 MED FILL — GABAPENTIN 300 MG CAPSULE: 300 | 30 days supply | Qty: 90 | Fill #0

## 2016-10-29 MED FILL — ACCU-CHEK FASTCLIX LANCETS: 50 days supply | Qty: 102 | Fill #1

## 2016-10-29 MED FILL — ACCU-CHEK GUIDE TEST STRIP: 50 days supply | Qty: 100 | Fill #1

## 2016-11-04 MED FILL — GABAPENTIN 100 MG CAPSULE: 100 | 10 days supply | Qty: 90 | Fill #0

## 2016-11-04 MED FILL — HYDROCHLOROTHIAZIDE 12.5 MG: 12.5 | 90 days supply | Qty: 90 | Fill #1

## 2016-11-04 MED FILL — DICLOFENAC SODIUM 1% GEL: 1 | 30 days supply | Qty: 100 | Fill #1

## 2016-11-13 ENCOUNTER — Telehealth: Payer: Self-pay

## 2016-11-13 NOTE — Patient Outreach (Signed)
Called Link to Wellness member to schedule a follow up appointment, there was no answer, left message to call me back. 

## 2016-11-27 DIAGNOSIS — I10 Essential (primary) hypertension: Secondary | ICD-10-CM | POA: Diagnosis not present

## 2016-11-27 DIAGNOSIS — I679 Cerebrovascular disease, unspecified: Secondary | ICD-10-CM | POA: Diagnosis not present

## 2016-11-27 DIAGNOSIS — E119 Type 2 diabetes mellitus without complications: Secondary | ICD-10-CM | POA: Diagnosis not present

## 2016-11-27 MED FILL — UNIFINE PENTIPS 32GX5/32: 32G X 4 MM | 25 days supply | Qty: 100 | Fill #1

## 2016-11-27 MED FILL — LANTUS SOLOSTAR 100 UNITS/M: 100 | 33 days supply | Qty: 15 | Fill #1

## 2016-11-27 MED FILL — UNIFINE PENTIPS 32GX5/32": 32G X 4 MM | 25 days supply | Qty: 100 | Fill #1

## 2016-11-29 ENCOUNTER — Other Ambulatory Visit: Payer: Self-pay | Admitting: *Deleted

## 2016-11-29 NOTE — Patient Outreach (Signed)
Left message on [name]'s only contact number requesting she call and schedule a Link To wellness follow up appointment as soon as possible. Bary RichardJanet S. Sheyna Pettibone RN,CCM,CDE [facility] Care Management Coordinator Link To Wellness Office Phone [phone] Office Fax [phone]

## 2016-12-02 MED FILL — ATORVASTATIN 80 MG TABLET: 80 | 90 days supply | Qty: 90 | Fill #2

## 2016-12-04 MED FILL — FAMOTIDINE 20 MG TABLET: 20 | 90 days supply | Qty: 180 | Fill #2

## 2016-12-06 ENCOUNTER — Ambulatory Visit: Payer: Self-pay | Admitting: *Deleted

## 2016-12-10 ENCOUNTER — Encounter (HOSPITAL_COMMUNITY): Payer: Self-pay | Admitting: Emergency Medicine

## 2016-12-10 ENCOUNTER — Ambulatory Visit (HOSPITAL_COMMUNITY)
Admission: EM | Admit: 2016-12-10 | Discharge: 2016-12-10 | Disposition: A | Discharge status: Home / Self Care | Payer: 59 | Attending: Internal Medicine | Admitting: Internal Medicine

## 2016-12-10 ENCOUNTER — Ambulatory Visit (INDEPENDENT_AMBULATORY_CARE_PROVIDER_SITE_OTHER): Payer: 59

## 2016-12-10 DIAGNOSIS — S92152A Displaced avulsion fracture (chip fracture) of left talus, initial encounter for closed fracture: Secondary | ICD-10-CM | POA: Diagnosis not present

## 2016-12-10 DIAGNOSIS — S92252A Displaced fracture of navicular [scaphoid] of left foot, initial encounter for closed fracture: Secondary | ICD-10-CM

## 2016-12-10 NOTE — Discharge Instructions (Signed)
You have an avulsion fracture tear navicular, we have placed your foot in a cam walker. I provided a referral to Dr. Magnus IvanBlackman, he has an orthopedist, contact his office in the morning to schedule an appointment for follow-up care and management. Do not bear any weight until you are cleared to do so by your orthopedist. For pain you may take extra strength Tylenol, do not exceed 4000 mg a day of Tylenol, as this can damage your liver.

## 2016-12-10 NOTE — ED Triage Notes (Signed)
[facility] a [facility] and ankle pain secondary to a fall earlier today.

## 2016-12-10 NOTE — ED Provider Notes (Signed)
CSN: [id number]     Arrival date & time 12/10/16  1734 History   First MD Initiated Contact with Patient 12/10/16 1754     Chief Complaint  Patient presents with  . Ankle Pain  . Knee Pain   (Consider location/radiation/quality/duration/timing/severity/associated sxs/prior Treatment) The history is provided by the patient.  Ankle Pain  Location:  Ankle Time since incident:  12 hours Injury: yes   Mechanism of injury: fall   Fall:    Fall occurred:  Walking   Impact surface:  Carpet Ankle location:  L ankle Pain details:    Quality:  Aching and dull   Radiates to:  Does not radiate   Severity:  Moderate   Onset quality:  Sudden   Duration:  12 hours   Timing:  Constant   Progression:  Unchanged Chronicity:  New Dislocation: no   Relieved by:  Rest Worsened by:  Bearing weight and rotation Associated symptoms: swelling   Associated symptoms: no back pain, no decreased ROM, no fever, no neck pain and no tingling     Past Medical History:  Diagnosis Date  . Arthritis   . Depression   . Diabetes mellitus without complication (HCC)   . Hypertension    Past Surgical History:  Procedure Laterality Date  . CARDIAC CATHETERIZATION N/A 04/10/2016   Procedure: Left Heart Cath and Coronary Angiography;  Surgeon: Iran OuchMuhammad A Arida, MD;  [facility];  Service: Cardiovascular;  Laterality: N/A;  . CESAREAN SECTION    . LAPAROSCOPIC APPENDECTOMY N/A 02/29/2016   Procedure: LAPAROSCOPIC APPENDECTOMY;  Surgeon: Axel FillerArmando Ramirez, MD;  Location: [facility] OR;  Service: General;  Laterality: N/A;   Family History  Problem Relation Age of Onset  . Diabetes Mother   . Heart disease Father        onset late 4650s   Social History  Substance Use Topics  . Smoking status: Former Games developermoker  . Smokeless tobacco: Never Used     Comment: quit smoking in 2010  . Alcohol use No   OB History    No data available     Review of Systems  Constitutional: Negative for chills and fever.   HENT: Negative.   Respiratory: Negative.   Cardiovascular: Negative.   Gastrointestinal: Negative.   Musculoskeletal: Positive for joint swelling. Negative for back pain and neck pain.  Skin: Negative.   Neurological: Negative.     Allergies  Metformin and related  Home Medications   Prior to Admission medications   Medication Sig Start Date End Date Taking? Authorizing Provider  acetaminophen (TYLENOL) 325 MG tablet Take 2 tablets (650 mg total) by mouth every 6 (six) hours as needed for fever. 04/15/16   Alison Murrayevine, Alma M, MD  aspirin 81 MG chewable tablet Chew 1 tablet (81 mg total) by mouth daily. 05/06/16   Azalee CourseMeng, Hao, PA  atorvastatin (LIPITOR) 80 MG tablet Take 1 tablet (80 mg total) by mouth daily at 6 PM. 05/06/16   Azalee CourseMeng, Hao, PA  CALCIUM-MAGNESIUM-ZINC PO Take 1 tablet by mouth daily.    [provider]  carvedilol (COREG) 12.5 MG tablet Take 1 tablet (12.5 mg total) by mouth 2 (two) times daily with a meal. 05/06/16   Azalee CourseMeng, Hao, PA  clopidogrel (PLAVIX) 75 MG tablet Take 1 tablet (75 mg total) by mouth daily. 05/06/16   Azalee CourseMeng, Hao, PA  famotidine (PEPCID) 20 MG tablet Take 1 tablet (20 mg total) by mouth 2 (two) times daily. 05/06/16   Azalee CourseMeng, Hao, PA  insulin aspart (NOVOLOG) 100 UNIT/ML injection Inject 5-10 Units into the skin 3 (three) times daily as needed for high blood sugar.     [provider]  insulin glargine (LANTUS) 100 UNIT/ML injection Inject 0.1 mLs (10 Units total) into the skin daily. 04/16/16   Alison Murray, MD  Lactobacillus (ACIDOPHILUS PO) Take 1-2 tablets by mouth daily.    [provider]  losartan (COZAAR) 50 MG tablet Take 1 tablet (50 mg total) by mouth daily. 05/06/16   Azalee Course, PA  Magnesium 250 MG TABS Take 250 mg by mouth daily.     [provider]   Meds Ordered and Administered this Visit  Medications - No data to display  BP (!) 211/80 (BP Location: Right Arm)   Pulse 73   Temp 98 F (36.7 C) (Oral)   Resp  18   SpO2 100%  No data found.   Physical Exam  Constitutional: She is oriented to person, place, and time. She appears well-developed and well-nourished. No distress.  HENT:  Head: Normocephalic.  Right Ear: External ear normal.  Left Ear: External ear normal.  Neck: Normal range of motion.  Cardiovascular: Normal rate and regular rhythm.   Pulmonary/Chest: Effort normal and breath sounds normal.  Musculoskeletal:       Left ankle: She exhibits decreased range of motion and swelling. She exhibits no deformity. Tenderness. AITFL tenderness found.  Neurological: She is alert and oriented to person, place, and time.  Skin: Skin is warm and dry. Capillary refill takes less than 2 seconds. She is not diaphoretic.  Psychiatric: She has a normal mood and affect. Her behavior is normal.  Nursing note and vitals reviewed.   Urgent Care Course     Procedures (including critical care time)  Labs Review Labs Reviewed - No data to display  Imaging Review Dg Ankle Complete Left  Result Date: 12/10/2016 CLINICAL DATA:  Larey Seat at home today.  Lateral pain and swelling. EXAM: LEFT ANKLE COMPLETE - 3+ VIEW COMPARISON:  None. FINDINGS: Pronounced lateral and dorsal soft tissue swelling. Small avulsion fracture of the dorsal navicular. No other regional bone abnormality. IMPRESSION: Avulsion fracture of the dorsal navicular. Pronounced lateral and dorsal soft tissue swelling. Electronically Signed   By: Paulina Fusi M.D.   On: 12/10/2016 18:32      MDM   1. Closed avulsion fracture of navicular bone of left foot, initial encounter    Avulsion fraction left navicular, patient made nonweightbearing, foot placed in a cam walker, given crutches, counseling on importance of follow-up with orthopedics, provided work note, given contact information for orthopedics.     Dorena Bodo, NP 12/10/16 1921

## 2016-12-12 ENCOUNTER — Telehealth (INDEPENDENT_AMBULATORY_CARE_PROVIDER_SITE_OTHER): Payer: Self-pay | Admitting: Orthopaedic Surgery

## 2016-12-12 ENCOUNTER — Ambulatory Visit (INDEPENDENT_AMBULATORY_CARE_PROVIDER_SITE_OTHER): Payer: 59 | Admitting: Orthopaedic Surgery

## 2016-12-12 DIAGNOSIS — S92252A Displaced fracture of navicular [scaphoid] of left foot, initial encounter for closed fracture: Secondary | ICD-10-CM

## 2016-12-12 NOTE — Progress Notes (Signed)
Office Visit Note   Patient: Rebecca Wagner           Date of Birth: 27-Apr-1961           MRN: [id number] Visit Date: 12/12/2016              Requested by: Leilani Ableeese, Betti, MD [address] [place], [place] [id number] PCP: Leilani Ableeese, Betti, MD   Assessment & Plan: Visit Diagnoses:  1. Closed displaced fracture of navicular bone of left foot, initial encounter     Plan: Recommend Cam Walker for a couple weeks and then wean as tolerated. May return to work on June 25. Follow-up with me as needed.  Follow-Up Instructions: Return if symptoms worsen or fail to improve.   Orders:  No orders of the defined types were placed in this encounter.  No orders of the defined types were placed in this encounter.     Procedures: No procedures performed   Clinical Data: No additional findings.   Subjective: No chief complaint on file.   Patient is a 56 year old female who comes in with avulsion fracture of the dorsal navicular from a mechanical fall. This happened 2 days ago. She's been a busy with a Cam Walker. She does not feel safe with crutches. She has had a previous stroke. She states the pain is mild. 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    Review of Systems  Constitutional: Negative.   HENT: Negative.   Eyes: Negative.   Respiratory: Negative.   Cardiovascular: Negative.   Endocrine: Negative.   Musculoskeletal: Negative.   Neurological: Negative.   Hematological: Negative.   Psychiatric/Behavioral: Negative.   All other systems reviewed and are negative.    Objective: Vital Signs: There were no vitals taken for this visit.  Physical Exam  Constitutional: She is oriented to person, place, and time. She appears well-developed and well-nourished.  HENT:  Head: Normocephalic and atraumatic.  Eyes: EOM are normal.  Neck: Neck supple.  Pulmonary/Chest: Effort normal.  Abdominal: Soft.  Neurological: She is alert and oriented to person,  place, and time.  Skin: Skin is warm. Capillary refill takes less than 2 seconds.  Psychiatric: She has a normal mood and affect. Her behavior is normal. Judgment and thought content normal.  Nursing note and vitals reviewed.   Ortho Exam Left foot exam shows moderate dorsal swelling with tenderness palpation of navicular. Otherwise the foot is benign. Specialty Comments:  No specialty comments available.  Imaging: No results found.   PMFS History: Patient Active Problem List   Diagnosis Date Noted  . Closed displaced fracture of navicular bone of left foot 12/12/2016  . Left Frontal-Parietal CVA 04/11/2016  . ST elevation - in setting of CVA & HTN Emergency - NOT MI 04/11/2016  . Multiple vessel coronary artery disease 04/11/2016  . Diabetes mellitus type 2, insulin dependent (HCC) 04/11/2016  . Acute respiratory failure with hypoxia (HCC) 04/11/2016  . Acute pulmonary edema (HCC)   . Hypertensive emergency   . Acute appendicitis 02/29/2016   Past Medical History:  Diagnosis Date  . Arthritis   . Depression   . Diabetes mellitus without complication (HCC)   . Hypertension     Family History  Problem Relation Age of Onset  . Diabetes Mother   . Heart disease Father        onset late 650s    Past Surgical History:  Procedure Laterality Date  . CARDIAC CATHETERIZATION N/A 04/10/2016  Procedure: Left Heart Cath and Coronary Angiography;  Surgeon: Iran Ouch, MD;  Location: [facility];  Service: Cardiovascular;  Laterality: N/A;  . CESAREAN SECTION    . LAPAROSCOPIC APPENDECTOMY N/A 02/29/2016   Procedure: LAPAROSCOPIC APPENDECTOMY;  Surgeon: Axel Filler, MD;  Location: [facility] OR;  Service: General;  Laterality: N/A;   Social History   Occupational History  . Not on file.   Social History Main Topics  . Smoking status: Former Games developer  . Smokeless tobacco: Never Used     Comment: quit smoking in 2010  . Alcohol use No  . Drug use: No  . Sexual  activity: Yes    Birth control/ protection: Post-menopausal

## 2016-12-12 NOTE — Telephone Encounter (Signed)
Patient states her employer need a note that states what will be her restriction's (if any)when she returns to work on 12/23/16. If there will not be any restrictions the note need to say, no restrictions.

## 2016-12-13 NOTE — Telephone Encounter (Signed)
Please advise 

## 2016-12-15 NOTE — Telephone Encounter (Signed)
No prolonged standing or walking for 4 weeks if she needs feels she needs restrictions.  If not, then she can be fully released.  Up to her.

## 2016-12-16 ENCOUNTER — Encounter (INDEPENDENT_AMBULATORY_CARE_PROVIDER_SITE_OTHER): Payer: Self-pay

## 2016-12-16 NOTE — Telephone Encounter (Signed)
LMOM for patient letting her know I left note at front desk

## 2016-12-19 MED FILL — ACCU-CHEK GUIDE TEST STRIP: 50 days supply | Qty: 100 | Fill #2

## 2016-12-20 ENCOUNTER — Ambulatory Visit (INDEPENDENT_AMBULATORY_CARE_PROVIDER_SITE_OTHER): Payer: 59 | Admitting: Orthopaedic Surgery

## 2016-12-20 ENCOUNTER — Ambulatory Visit (INDEPENDENT_AMBULATORY_CARE_PROVIDER_SITE_OTHER): Payer: 59

## 2016-12-20 ENCOUNTER — Encounter (INDEPENDENT_AMBULATORY_CARE_PROVIDER_SITE_OTHER): Payer: Self-pay | Admitting: Orthopaedic Surgery

## 2016-12-20 ENCOUNTER — Ambulatory Visit: Payer: Self-pay | Admitting: *Deleted

## 2016-12-20 DIAGNOSIS — S92255D Nondisplaced fracture of navicular [scaphoid] of left foot, subsequent encounter for fracture with routine healing: Secondary | ICD-10-CM | POA: Diagnosis not present

## 2016-12-21 DIAGNOSIS — S92255D Nondisplaced fracture of navicular [scaphoid] of left foot, subsequent encounter for fracture with routine healing: Secondary | ICD-10-CM | POA: Insufficient documentation

## 2016-12-21 NOTE — Progress Notes (Signed)
Rebecca Wagner returns today for f/u of Rebecca Wagner navicular avulsion fracture.  She is overall doing better.  She's still ambulating with a cane.  She's not been able to wean off of the CAM boot.  She's taking tylenol for pain and still tender to touch.  She doesn't feel ready to return to work next week.  On exam she continues to walk with antalgic gait.  She's still ttp over the fracture.  Swelling is improved.  xrays are stable.  Medically, I think she would benefit from another week off due to Rebecca Wagner antalgic gait and pain.  She may return to work the following week after this upcoming week.  F/u prn.

## 2016-12-26 DIAGNOSIS — I679 Cerebrovascular disease, unspecified: Secondary | ICD-10-CM | POA: Diagnosis not present

## 2016-12-26 DIAGNOSIS — I10 Essential (primary) hypertension: Secondary | ICD-10-CM | POA: Diagnosis not present

## 2016-12-26 DIAGNOSIS — E119 Type 2 diabetes mellitus without complications: Secondary | ICD-10-CM | POA: Diagnosis not present

## 2016-12-26 DIAGNOSIS — M25572 Pain in left ankle and joints of left foot: Secondary | ICD-10-CM | POA: Diagnosis not present

## 2017-01-03 MED FILL — CARVEDILOL 25 MG TABLET: 25 | 90 days supply | Qty: 180 | Fill #1

## 2017-01-10 ENCOUNTER — Other Ambulatory Visit: Payer: Self-pay | Admitting: *Deleted

## 2017-01-10 ENCOUNTER — Encounter: Payer: Self-pay | Admitting: *Deleted

## 2017-01-10 VITALS — BP 120/70 | Ht 64.0 in | Wt 181.6 lb

## 2017-01-10 DIAGNOSIS — E1169 Type 2 diabetes mellitus with other specified complication: Secondary | ICD-10-CM

## 2017-01-10 DIAGNOSIS — Z794 Long term (current) use of insulin: Principal | ICD-10-CM

## 2017-01-10 LAB — POCT CBG (FASTING - GLUCOSE)-MANUAL ENTRY: Glucose Fasting, POC: 140 mg/dL — AB (ref 70–99)

## 2017-01-10 LAB — POCT GLYCOSYLATED HEMOGLOBIN (HGB A1C): Hemoglobin A1C: 9.1

## 2017-01-10 NOTE — Patient Outreach (Signed)
[facility]) Care Management   01/10/2017  Rebecca MeuseValerie S Dorsey May 21, 1961 [id number]  Rebecca MeuseValerie S Welty is an 56 y.o. 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  Subjective: Rebecca Wagner says she fell in the bathroom when she got up in the middle of the night to urinate on 12/10/16 and broke a one in her left foot so this has caused her to be out of work again and she says she only has $5 in her checking account. She says she is not requiring any medication for pain.  She says she saw her primary care MD in late June and she is checking her blood sugar in the morning and after meals but not before her meals. She says she is now giving herself mealtime insulin. She reports her fasting blood sugar variance as 80-123 and her post meal variance of 180-250.  Rebecca Wagner is requesting a POC Hgb A1C today as she says she cannot afford to pay lab costs.   Objective:   Rebecca Wagner is using a straight cane to ambulate. She is wearing regular shoes.    Review of Systems  All other systems reviewed and are negative.  Physical Exam  Constitutional: She is oriented to person, place, and time. She appears well-developed and well-nourished.  Respiratory: Effort normal.  Neurological: She is alert and oriented to person, place, and time.  Skin: Skin is warm and dry.  Psychiatric: She has a normal mood and affect. Her behavior is normal. Judgment and thought content normal.   BP= 120/70 POC Hgb A1C= 9.1% (Estimated average glucose= 214) POC 2 hour post meal CBG= 140  Encounter Medications:   Outpatient Encounter Prescriptions as of 01/10/2017  Medication Sig Note  . aspirin 81 MG chewable tablet Chew 1 tablet (81 mg total) by mouth daily.   Marland Kitchen. atorvastatin (LIPITOR) 80 MG tablet Take 1 tablet (80  mg total) by mouth daily at 6 PM.   . CALCIUM-MAGNESIUM-ZINC PO Take 1 tablet by mouth daily.   . carvedilol (COREG) 12.5 MG tablet Take 1 tablet (12.5 mg total) by mouth 2 (two) times daily with a meal.   . clopidogrel (PLAVIX) 75 MG tablet Take 1 tablet (75 mg total) by mouth daily.   . famotidine (PEPCID) 20 MG tablet Take 1 tablet (20 mg total) by mouth 2 (two) times daily.   . insulin aspart (NOVOLOG) 100 UNIT/ML injection Inject 5-10 Units into the skin 3 (three) times daily as needed for high blood sugar.  01/10/2017: Takes 3-8 units if her blood sugar is >199  . insulin glargine (LANTUS) 100 UNIT/ML injection Inject 0.1 mLs (10 Units total) into the skin daily. 01/10/2017: 22 units at hs at 10:00 pm  . losartan (COZAAR) 50 MG tablet Take 1 tablet (50 mg total) by mouth daily.   . Magnesium 250 MG TABS Take 250 mg by mouth daily.    Marland Kitchen. acetaminophen (TYLENOL) 325 MG tablet Take 2 tablets (650 mg total) by mouth every 6 (six) hours as needed for fever.   . Lactobacillus (ACIDOPHILUS PO) Take 1-2 tablets by mouth daily.    No facility-administered encounter medications on file as of 01/10/2017.     Functional Status:   In your present state of health, do you have any difficulty performing the following activities: 01/10/2017 08/29/2016  Hearing? N N  Vision? N N  Difficulty concentrating or making decisions? N N  Walking or climbing stairs? N Y  Dressing or bathing? N N  Doing errands, shopping? N N  Preparing Food and eating ? N N  Using the Toilet? N N  In the past six months, have you accidently leaked urine? N N  Do you have problems with loss of bowel control? N N  Managing your Medications? N N  Managing your Finances? N N  Housekeeping or managing your Housekeeping? N N  Some recent data might be hidden    Fall/Depression Screening:    PHQ 2/9 Scores 08/29/2016 05/13/2016 02/28/2016  PHQ - 2 Score 0 0 0   Assessment:   [employer] employee and Link To Wellness member for self  management assistance with chronic disease states.    [facility] CM Care Plan Problem One   Flowsheet Row Most Recent Value  Care Plan Problem One Patient with Type II DM, CAD, HTN, hyperlipidemia, s/p left frontal parietal stroke and MI in October 2017;  not meeting treatment targets for hyperlipidemia and diabetes  Role Documenting the Problem One  Care Management Coordinator  Care Plan for Problem One  Active  [facility] Long Term Goal (31-90 days) Patient will demonstrate: Improved blood sugar management as evidenced by blood sugar readings with >50% meeting target, improved Hgb A1C at next assessment,  improved lipid panel on next assessment, home BP readings average <140/<90, and ongoing weight loss at next assessment.   [facility] Long Term Goal Start Date  01/10/17  Interventions for Problem One Long Term Goal Discussed recent fall at home and treatment plan for broken left foot, allowed Rebecca Wagner to express the stress she is feeling related to her financial situation due to health issues keeping her out of work and emotional support provided, reviewed patient's medications and assessed medication adherence, reviewed DM medications of Lantus and humalog insulin including the mechanism of action, onset of action, peak and duration, common side effects, dosage and dosing schedule, reinforced importance of taking all medications as prescribed,reviewed patient's CBG readings, reviewed blood glucose monitoring and interpretation, reviewed recommended target ranges for pre-meal and post-meal, assessed and discussed results of today's POC CBG and POC Hgb A1C, reviewed Hgb A1C goal, reviewed CHO controlled meal plan and encouraged Rebecca Wagner to utilize the low glycemic index food guide she was given last visit when making food choices, provided her with sharps disposal and glucose test strips,  arranged for Link To Wellness follow up on 02/13/17      Plan:  RNCM will fax today's note to patient's primary care provider. RNCM will plan  to meet with patient next month to assist with ongoing chronic disease management assistance and assess progress toward mutually set goals.   Bary Richard RN,CCM,CDE [facility] Care Management Coordinator Link To Wellness Office Phone [phone] Office Fax [phone]

## 2017-01-13 MED FILL — LOSARTAN POTASSIUM 100 MG T: 100 | 90 days supply | Qty: 90 | Fill #0

## 2017-01-23 MED FILL — UNIFINE PENTIPS 32GX5/32": 32G X 4 MM | 25 days supply | Qty: 100 | Fill #2

## 2017-01-23 MED FILL — UNIFINE PENTIPS 32GX5/32: 32G X 4 MM | 25 days supply | Qty: 100 | Fill #2

## 2017-01-23 MED FILL — LANTUS SOLOSTAR 100 UNITS/M: 100 | 33 days supply | Qty: 15 | Fill #2

## 2017-01-23 MED FILL — ACCU-CHEK FASTCLIX LANCETS: 50 days supply | Qty: 102 | Fill #2

## 2017-01-27 DIAGNOSIS — E119 Type 2 diabetes mellitus without complications: Secondary | ICD-10-CM | POA: Diagnosis not present

## 2017-01-27 DIAGNOSIS — I10 Essential (primary) hypertension: Secondary | ICD-10-CM | POA: Diagnosis not present

## 2017-01-27 DIAGNOSIS — I679 Cerebrovascular disease, unspecified: Secondary | ICD-10-CM | POA: Diagnosis not present

## 2017-01-29 MED FILL — ACCU-CHEK GUIDE TEST STRIP: 50 days supply | Qty: 100 | Fill #3

## 2017-02-07 MED FILL — cloNIDine HCL 0.2 MG TABS: 0.2 | 30 days supply | Qty: 60 | Fill #2

## 2017-02-07 MED FILL — HYDROCHLOROTHIAZIDE 12.5 MG: 12.5 | 90 days supply | Qty: 90 | Fill #2

## 2017-02-07 MED FILL — CLOPIDOGREL 75 MG TABLET: 75 | 90 days supply | Qty: 90 | Fill #3

## 2017-02-13 ENCOUNTER — Ambulatory Visit: Payer: Self-pay | Admitting: *Deleted

## 2017-02-27 ENCOUNTER — Other Ambulatory Visit: Payer: Self-pay | Admitting: *Deleted

## 2017-02-27 ENCOUNTER — Ambulatory Visit: Payer: Self-pay | Admitting: *Deleted

## 2017-02-27 VITALS — Ht 64.0 in | Wt 185.4 lb

## 2017-02-27 DIAGNOSIS — Z794 Long term (current) use of insulin: Principal | ICD-10-CM

## 2017-02-27 DIAGNOSIS — E119 Type 2 diabetes mellitus without complications: Secondary | ICD-10-CM

## 2017-02-27 DIAGNOSIS — I10 Essential (primary) hypertension: Secondary | ICD-10-CM | POA: Diagnosis not present

## 2017-02-27 DIAGNOSIS — R5381 Other malaise: Secondary | ICD-10-CM | POA: Diagnosis not present

## 2017-02-27 DIAGNOSIS — I679 Cerebrovascular disease, unspecified: Secondary | ICD-10-CM | POA: Diagnosis not present

## 2017-02-27 LAB — POCT CBG (FASTING - GLUCOSE)-MANUAL ENTRY: GLUCOSE FASTING, POC: 121 mg/dL — AB (ref 70–99)

## 2017-02-27 LAB — POCT GLYCOSYLATED HEMOGLOBIN (HGB A1C): HEMOGLOBIN A1C: 8.9

## 2017-02-28 NOTE — Patient Outreach (Signed)
[facility]) Care Management   02/27/2017  MERLYN BOLLEN [birth year] [id number]  Rebecca Wagner is an 56 y.o. female who presents to the [place] Management office for routine Link To Wellness follow up  for self management assistance with Type II DM with macrovascular complications of CAD (s/p MI) and stroke, HTN, CAD and hyperlipidemia.  Subjective: [name] says she has been checking her blood sugar 3-4 times daily and is complaining of the pads of her fingers being sore and bruised. She says she had a low blood sugar this morning of 64 and that she woke up last night with symptoms for a low blood sugar, she did not check her sugar but did eat 1/2 of a pear.  She attributes her ongoing weight loss to "not eating much, just not hungry" She says she will see her primary care MD today after this appointment and is asking for a POC Hgb A1C as she says she cannot afford to pay lab costs.  She says she is still struggling to pay her hospital bills and is having difficulty getting her pay from the  long term [place].   Objective:   The pads of most of her fingers are bruised with visible puncture marks from frequent finger sticks Review of Systems  All other systems reviewed and are negative.  Physical Exam  Constitutional: She is oriented to person, place, and time. She appears well-developed and well-nourished.  Respiratory: Effort normal.  Neurological: She is alert and oriented to person, place, and time.  Skin: Skin is warm and dry.  Psychiatric: She has a normal mood and affect. Her behavior is normal. Judgment and thought content normal.    POC Hgb A1C= 8.9% (Estimated average glucose= ( 209) POC 2 hour post meal CBG= 121  Encounter Medications:   Outpatient Encounter Prescriptions as of 02/27/2017  Medication Sig Note  . acetaminophen (TYLENOL) 325 MG tablet Take 2 tablets (650 mg total) by mouth every 6 (six) hours  as needed for fever.   Marland Kitchen aspirin 81 MG chewable tablet Chew 1 tablet (81 mg total) by mouth daily.   Marland Kitchen atorvastatin (LIPITOR) 80 MG tablet Take 1 tablet (80 mg total) by mouth daily at 6 PM.   . CALCIUM-MAGNESIUM-ZINC PO Take 1 tablet by mouth daily.   . carvedilol (COREG) 12.5 MG tablet Take 1 tablet (12.5 mg total) by mouth 2 (two) times daily with a meal.   . clopidogrel (PLAVIX) 75 MG tablet Take 1 tablet (75 mg total) by mouth daily.   . famotidine (PEPCID) 20 MG tablet Take 1 tablet (20 mg total) by mouth 2 (two) times daily.   . insulin glargine (LANTUS) 100 UNIT/ML injection Inject 0.1 mLs (10 Units total) into the skin daily. 02/27/2017: Current dose is 21 units  . insulin lispro (HUMALOG) 100 UNIT/ML injection Inject 5-10 Units into the skin 3 (three) times daily before meals.  01/10/2017: Takes if her blood sugar is >199  . losartan (COZAAR) 50 MG tablet Take 1 tablet (50 mg total) by mouth daily.   . Magnesium 250 MG TABS Take 250 mg by mouth daily.    . insulin aspart (NOVOLOG) 100 UNIT/ML injection Inject 5-10 Units into the skin 3 (three) times daily as needed for high blood sugar.  01/10/2017: Takes 3-8 units if her blood sugar is >199  . Lactobacillus (ACIDOPHILUS PO) Take 1-2 tablets by mouth daily. 02/27/2017: Consumes greek yogurt frequently   No facility-administered  encounter medications on file as of 02/27/2017.     Functional Status:   In your present state of health, do you have any difficulty performing the following activities: 02/27/2017 01/10/2017  Hearing? N N  Vision? N N  Difficulty concentrating or making decisions? N N  Walking or climbing stairs? N N  Comment - -  Dressing or bathing? N N  Doing errands, shopping? N N  Preparing Food and eating ? N N  Using the Toilet? N N  In the past six months, have you accidently leaked urine? N N  Do you have problems with loss of bowel control? N N  Managing your Medications? N N  Managing your Finances? N N   Housekeeping or managing your Housekeeping? N N  Some recent data might be hidden    Fall/Depression Screening:    PHQ 2/9 Scores 08/29/2016 05/13/2016 02/28/2016  PHQ - 2 Score 0 0 0   Assessment:   [employer] employee and Link To Wellness member for self management assistance with chronic disease states.    [facility] CM Care Plan Problem One   Flowsheet Row Most Recent Value  Care Plan Problem One Patient with Type II DM, CAD, HTN, hyperlipidemia, s/p left frontal parietal stroke and MI in October 2017;  not meeting treatment targets for hyperlipidemia and diabetes  Role Documenting the Problem One  Care Management Coordinator  Care Plan for Problem One  Active  [facility] Long Term Goal (31-90 days) Patient will demonstrate: Improved blood sugar management as evidenced by blood sugar readings with >50% meeting target without increased incidences of hypoglycemia, improved Hgb A1C at next assessment,  improved lipid panel on next assessment, home BP readings average <140/<90, and ongoing weight loss at next assessment.   [facility] Long Term Goal Start Date  02/27/17  Interventions for Problem One Long Term Goal Reviewed patient's medications and assessed medication adherence, reviewed DM medications of Lantus and humalog insulin including the mechanism of action, onset of action, peak and duration, common side effects, dosage and dosing schedule, reinforced importance of taking all medications as prescribed, reviewed blood glucose monitoring and interpretation, reviewed blood sugar readings focusing on hypoglycemic episodes,  advised her that her fasting blood sugar values are indicative of Lantus' efficacy and that if she is having consecutive low fasting values her Lantus dose needs to be reduced, assessed and discussed results of today's POC CBG and POC Hgb A1C, reviewed Hgb A1C goal, using the demonstration kit showed [name] the [employer] Freestyle flash glucose monitoring system and discussed how it would eliminate or  reduce finger sticks, reviewed the costs of the system and provided her with a brochure to take to her provider,   arranged for Link To Wellness follow up on 03/27/17      Plan:  RNCM will fax today's note to patient's primary care provider. RNCM will plan to meet with patient next month to assist with ongoing chronic disease management assistance and assess progress toward mutually set goals.   Barrington Ellison RN,CCM,CDE [place] Management Coordinator Link To Wellness Office Phone [phone] Office Fax [phone]

## 2017-03-12 MED FILL — UNIFINE PENTIPS 32GX5/32: 32G X 4 MM | 25 days supply | Qty: 100 | Fill #3

## 2017-03-12 MED FILL — FAMOTIDINE 20 MG TABLET: 20 | 90 days supply | Qty: 180 | Fill #3

## 2017-03-12 MED FILL — UNIFINE PENTIPS 32GX5/32": 32G X 4 MM | 25 days supply | Qty: 100 | Fill #3

## 2017-03-12 MED FILL — ATORVASTATIN 80 MG TABLET: 80 | 90 days supply | Qty: 90 | Fill #3

## 2017-03-27 ENCOUNTER — Encounter: Payer: Self-pay | Admitting: *Deleted

## 2017-03-27 ENCOUNTER — Other Ambulatory Visit: Payer: Self-pay | Admitting: *Deleted

## 2017-03-27 VITALS — BP 138/88 | Ht 64.0 in | Wt 186.0 lb

## 2017-03-27 DIAGNOSIS — I10 Essential (primary) hypertension: Secondary | ICD-10-CM

## 2017-03-27 DIAGNOSIS — E119 Type 2 diabetes mellitus without complications: Secondary | ICD-10-CM

## 2017-03-27 DIAGNOSIS — Z794 Long term (current) use of insulin: Principal | ICD-10-CM

## 2017-03-27 DIAGNOSIS — R5381 Other malaise: Secondary | ICD-10-CM | POA: Diagnosis not present

## 2017-03-27 DIAGNOSIS — I679 Cerebrovascular disease, unspecified: Secondary | ICD-10-CM | POA: Diagnosis not present

## 2017-03-27 DIAGNOSIS — E782 Mixed hyperlipidemia: Secondary | ICD-10-CM

## 2017-03-27 LAB — POCT CBG (FASTING - GLUCOSE)-MANUAL ENTRY: GLUCOSE FASTING, POC: 125 mg/dL — AB (ref 70–99)

## 2017-03-27 LAB — POCT GLYCOSYLATED HEMOGLOBIN (HGB A1C): HEMOGLOBIN A1C: 9

## 2017-03-27 NOTE — Patient Outreach (Signed)
[facility]) Care Management   03/27/2017  Rebecca Wagner December 18, 1960 [id number]  Rebecca Wagner is an 56 y.o. female who presents to the [facility] Care Management office for routine Link To Wellness follow up  for self management assistance with Type II DM with macrovascular complications of CAD (s/p MI) and stroke, HTN, CAD and hyperlipidemia.  Subjective: Rebecca Wagner says she was given a prescription for the [facility] when she last saw Dr. Pecola Leisure on 8/30 but she has not taken the prescription to the outpatient  pharmacy because she didn't have the copay amount.  She says she will see her primary care MD today after this appointment and is asking for another  POC Hgb A1C as she says she cannot afford to pay lab costs. She says she expects her Hgb A1C will be higher because she is not choosing her snacks well.  She says she is still struggling to pay her hospital bills and is having difficulty getting her pay from the long term disability company [address].  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   Objective:    Review of Systems  All other systems reviewed and are negative.  Physical Exam  Constitutional: She is oriented to person, place, and time. She appears well-developed and well-nourished.  Respiratory: Effort normal.  Neurological: She is alert and oriented to person, place, and time.  Skin: Skin is warm and dry.  Psychiatric: She has a normal mood and affect. Her behavior is normal. Judgment and thought content normal.    POC Hgb A1C= 9.0% (Estimated average glucose= ( 212) Fasting POC CBG= 125  Outpatient Encounter Prescriptions as of 03/27/2017  Medication Sig Note  . acetaminophen (TYLENOL) 325 MG tablet Take 2 tablets (650 mg total) by mouth every 6 (six) hours  as needed for fever.   Marland Kitchen aspirin 81 MG chewable tablet Chew 1 tablet (81 mg total) by mouth daily.   Marland Kitchen atorvastatin (LIPITOR) 80 MG tablet Take 1 tablet (80 mg total) by mouth daily at 6 PM.   . calcium carbonate (OSCAL) 1500 (600 Ca) MG TABS tablet Take 600 mg of elemental calcium by mouth daily.   . carvedilol (COREG) 12.5 MG tablet Take 1 tablet (12.5 mg total) by mouth 2 (two) times daily with a meal.   . clopidogrel (PLAVIX) 75 MG tablet Take 1 tablet (75 mg total) by mouth daily.   . famotidine (PEPCID) 20 MG tablet Take 1 tablet (20 mg total) by mouth 2 (two) times daily.   . insulin glargine (LANTUS) 100 UNIT/ML injection Inject 0.1 mLs (10 Units total) into the skin daily. 02/27/2017: Current dose is 21 units  . insulin lispro (HUMALOG) 100 UNIT/ML injection Inject 5-10 Units into the skin 3 (three) times daily before meals.  01/10/2017: Takes if her blood sugar is >199  . losartan (COZAAR) 50 MG tablet Take 1 tablet (50 mg total) by mouth daily.   . Magnesium 250 MG TABS Take 250 mg by mouth daily.    . Lactobacillus (ACIDOPHILUS PO) Take 1-2 tablets by mouth daily. 02/27/2017: Consumes greek yogurt frequently   No facility-administered encounter medications on file as of 03/27/2017.     Functional Status:   In your present state of health, do you have any difficulty  performing the following activities: 03/27/2017 02/27/2017  Hearing? N N  Vision? N N  Difficulty concentrating or making decisions? N N  Walking or climbing stairs? N N  Comment - -  Dressing or bathing? N N  Doing errands, shopping? N N  Preparing Food and eating ? N N  Using the Toilet? N N  In the past six months, have you accidently leaked urine? N N  Do you have problems with loss of bowel control? N N  Managing your Medications? N N  Managing your Finances? N N  Housekeeping or managing your Housekeeping? N N  Some recent data might be hidden    Fall/Depression Screening:    PHQ 2/9 Scores 08/29/2016  05/13/2016 02/28/2016  PHQ - 2 Score 0 0 0   Assessment:   [employer] employee and Link To Wellness member for self management assistance with chronic disease states.    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;  not meeting treatment targets for hyperlipidemia and diabetes, meeting targets for HTN on today's assessment, obese with current body mass index 31.91  Role Documenting the Problem One  Care Management Coordinator  Care Plan for Problem One  Active  [facility] Long Term Goal (31-90 days) Patient will demonstrate: Improved blood sugar management as evidenced by blood sugar readings with >50% meeting target without increased incidences of hypoglycemia, improved Hgb A1C at next assessment,  improved lipid panel on next assessment, home BP readings average <140/<90, and ongoing weight loss or at next assessment.   [facility] Long Term Goal Start Date  03/27/17  Interventions for Problem One Long Term Goal Reviewed patient's medications and assessed medication adherence, reviewed DM medications of Lantus and humalog insulin including the mechanism of action, onset of action, peak and duration, common side effects, dosage and dosing schedule, reinforced importance of taking all medications as prescribed, reviewed blood glucose monitoring and interpretation,  assessed and discussed results of today's POC CBG and POC Hgb A1C, discussed possible reasons for the increased Hgb A1C and reviewed the plate method and basic CHO counting,  encouraged Rebecca Wagner to fill the prescription for the Abbot Freestyle flash glucose monitoring system and explained how this may help her diabetes management, Rebecca Wagner will call this RNCM to schedule an appointment for placement of the first Libre sensor and review on how to use the system once the sensor has been placed        Plan:  RNCM will  fax today's note to patient's primary care provider. RNCM will plan to meet with patient next month to assist with ongoing chronic disease management assistance and assess progress toward mutually set goals.   Bary Richard RN,CCM,CDE [facility] Care Management Coordinator Link To Wellness Office Phone [phone] Office Fax [phone]

## 2017-04-03 MED FILL — HUMALOG 100 UNITS/ML KWIKPE: 100 | 30 days supply | Qty: 9 | Fill #1

## 2017-04-03 MED FILL — CARVEDILOL 25 MG TABLET: 25 | 90 days supply | Qty: 180 | Fill #2

## 2017-04-03 MED FILL — LANTUS SOLOSTAR 100 UNITS/M: 100 | 33 days supply | Qty: 15 | Fill #3

## 2017-04-03 MED FILL — VOLTAREN 1% GEL: 1 | 30 days supply | Qty: 100 | Fill #2

## 2017-04-03 MED FILL — ASPIRIN ADULT LOW STRENGTH: 81 | 90 days supply | Qty: 90 | Fill #2

## 2017-05-06 ENCOUNTER — Other Ambulatory Visit: Payer: Self-pay | Admitting: Physician Assistant

## 2017-05-06 DIAGNOSIS — E119 Type 2 diabetes mellitus without complications: Secondary | ICD-10-CM | POA: Diagnosis not present

## 2017-05-06 DIAGNOSIS — I679 Cerebrovascular disease, unspecified: Secondary | ICD-10-CM | POA: Diagnosis not present

## 2017-05-06 DIAGNOSIS — R5383 Other fatigue: Secondary | ICD-10-CM | POA: Diagnosis not present

## 2017-05-06 DIAGNOSIS — I10 Essential (primary) hypertension: Secondary | ICD-10-CM | POA: Diagnosis not present

## 2017-05-06 MED FILL — LOSARTAN POTASSIUM 100 MG T: 100 | 90 days supply | Qty: 90 | Fill #1

## 2017-05-06 MED FILL — cloNIDine HCL 0.2 MG TABS: 0.2 | 30 days supply | Qty: 60 | Fill #3

## 2017-05-06 NOTE — Telephone Encounter (Signed)
Please review for refill, thanks ! 

## 2017-05-06 NOTE — Telephone Encounter (Signed)
Refill Request.  

## 2017-05-06 NOTE — Telephone Encounter (Signed)
REFILL 

## 2017-05-08 ENCOUNTER — Ambulatory Visit: Payer: Self-pay | Admitting: *Deleted

## 2017-05-10 ENCOUNTER — Other Ambulatory Visit: Payer: Self-pay | Admitting: *Deleted

## 2017-05-10 NOTE — Patient Outreach (Signed)
Rebecca Wagner did not keep her 10:30 appointment on 05/08/17 so attempted to reach her via her mobile number but unable to leave message because her voice mail box was full. Then attempted to send secure e-mail to her [facility] e-mail address but her name is no longer listed on the e-mail directory. Sent e-mail to her personal e-mail requesting she contact this RNCM to discuss changes to the Link To Wellness program in 2019. Await  return response from Rebecca Wagner. Rebecca RichardJanet [name] RN,CCM,CDE [facility] Care Management Coordinator Link To Wellness and [facility] Office Phone [phone] Office Fax [phone]

## 2017-06-02 MED FILL — HUMALOG 100 UNITS/ML KWIKPE: 100 | 30 days supply | Qty: 9 | Fill #2

## 2017-06-02 MED FILL — LANTUS SOLOSTAR 100 UNITS/M: 100 | 33 days supply | Qty: 15 | Fill #4

## 2017-06-02 MED FILL — CLOPIDOGREL 75 MG TABLET: 75 | 30 days supply | Qty: 30 | Fill #0

## 2017-06-03 ENCOUNTER — Other Ambulatory Visit: Payer: Self-pay | Admitting: *Deleted

## 2017-06-03 VITALS — Ht 64.0 in | Wt 192.4 lb

## 2017-06-03 DIAGNOSIS — E1169 Type 2 diabetes mellitus with other specified complication: Secondary | ICD-10-CM

## 2017-06-03 DIAGNOSIS — Z794 Long term (current) use of insulin: Principal | ICD-10-CM

## 2017-06-03 LAB — POCT GLYCOSYLATED HEMOGLOBIN (HGB A1C): HEMOGLOBIN A1C: 8.5

## 2017-06-03 NOTE — Patient Outreach (Addendum)
[facility]) Care Management   06/03/2017  Rebecca Wagner [birth year] [id number]  Rebecca Wagner is an 56 y.o. 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  Subjective: Rebecca Wagner says she [facility] she last saw Dr. Pecola Leisureeese on 8/30 but says she has decided she 'd rather stick her finger intermittently than wear a continuous glucose monitor.  She says she will see her primary care MD today after this appointment and is asking for another  POC Hgb A1C as she says she cannot afford to pay lab costs.  She attributes her weight gain of approximately 6 lbs to holiday eating.  She reports her fasting blood sugar this morning as 112.  Objective:    Review of Systems  All other systems reviewed and are negative.  Physical Exam  Constitutional: She is oriented to person, place, and time. She appears well-developed and well-nourished.  Respiratory: Effort normal.  Neurological: She is alert and oriented to person, place, and time.  Skin: Skin is warm and dry.  Psychiatric: She has a normal mood and affect. Her behavior is normal. Judgment and thought content normal.    POC Hgb A1C= 8.5% (Estimated average glucose= ( 197) POC random CBG= 161 (one hour after 8 oz of sweet tea)    Outpatient Encounter Prescriptions as of 03/27/2017  Medication Sig Note  . acetaminophen (TYLENOL) 325 MG tablet Take 2 tablets (650 mg total) by mouth every 6 (six) hours as needed for fever.   Marland Kitchen. aspirin 81 MG chewable tablet Chew 1 tablet (81 mg total) by mouth daily.   Marland Kitchen. atorvastatin (LIPITOR) 80 MG tablet Take 1 tablet (80 mg total) by mouth daily at 6 PM.   . calcium carbonate (OSCAL) 1500 (600 Ca) MG TABS tablet Take 600 mg of elemental calcium  by mouth daily.   . carvedilol (COREG) 12.5 MG tablet Take 1 tablet (12.5 mg total) by mouth 2 (two) times daily with a meal.   . clopidogrel (PLAVIX) 75 MG tablet Take 1 tablet (75 mg total) by mouth daily.   . famotidine (PEPCID) 20 MG tablet Take 1 tablet (20 mg total) by mouth 2 (two) times daily.   . insulin glargine (LANTUS) 100 UNIT/ML injection Inject 0.1 mLs (10 Units total) into the skin daily. 06/03/2017: Current dose is 28 units  . insulin lispro (HUMALOG) 100 UNIT/ML injection Inject 5-10 Units into the skin 3 (three) times daily before meals.  01/10/2017: Takes if her blood sugar is >199  . losartan (COZAAR) 50 MG tablet Take 1 tablet (50 mg total) by mouth daily.   . Magnesium 250 MG TABS Take 250 mg by mouth daily.    . Lactobacillus (ACIDOPHILUS PO) Take 1-2 tablets by mouth daily. 02/27/2017: Consumes greek yogurt frequently   No facility-administered encounter medications on file as of 03/27/2017.     Functional Status:   In your present state of health, do you have any difficulty performing the following activities: 03/27/2017 02/27/2017  Hearing? N N  Vision? N N  Difficulty concentrating or making decisions? N N  Walking or climbing stairs? N N  Comment - -  Dressing or bathing? N N  Doing errands, shopping? N N  Preparing Food and eating ? N N  Using  the Toilet? N N  In the past six months, have you accidently leaked urine? N N  Do you have problems with loss of bowel control? N N  Managing your Medications? N N  Managing your Finances? N N  Housekeeping or managing your Housekeeping? 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;  not meeting treatment targets for hyperlipidemia and diabetes, meeting targets for HTN, obese with current body mass index 33.01  Role Documenting the Problem One  Care Management Coordinator  Care Plan for Problem One Not active  [facility] Long Term Goal (31-90 days) Patient will demonstrate: Improved blood sugar management as evidenced by blood sugar readings with >50% meeting target without increased incidences of hypoglycemia, improved Hgb A1C at next assessment,  improved lipid panel on next assessment, home BP readings average <140/<90, and ongoing weight loss or no weight gain at next assessment.   [facility] Long Term Goal Start Date  03/27/17  Interventions for Problem One Long Term Goal Reviewed patient's medications and assessed medication adherence, reviewed DM medications of Lantus and humalog insulin including the mechanism of action, onset of action, peak and duration, common side effects, dosage and dosing schedule, reinforced importance of taking all medications as prescribed, reviewed blood glucose monitoring and interpretation,  assessed and discussed results of today's POC CBG and POC Hgb A1C, discussed reason for the increased POC CBG,  reviewed the plate method and basic CHO counting,  Discussed strategies to avoid overeating during the holidays, encouraged Rebecca Wagner to fill the prescription for the [employer] Freestyle flash glucose monitoring system and explained how this may help her diabetes management, explained that disease self management assistance will be transitioned from Link To Wellness to either [facility] or [facility] in 2019, secure e-mail sent to [facility] tech support to assess her phone's compatibility with the [employer] platform, will close case to [employer] program      Plan:  RNCM will fax today's note to patient's primary care provider. RNCM will plan to meet with patient next month to  assist with ongoing chronic disease management assistance and assess progress toward mutually set goals.   Bary RichardJanet S. Kendrix Orman RN,CCM,CDE [facility] Care Management Coordinator Link To Wellness Office Phone [phone] Office Fax [phone]

## 2017-06-05 DIAGNOSIS — I10 Essential (primary) hypertension: Secondary | ICD-10-CM | POA: Diagnosis not present

## 2017-06-05 DIAGNOSIS — E119 Type 2 diabetes mellitus without complications: Secondary | ICD-10-CM | POA: Diagnosis not present

## 2017-06-05 DIAGNOSIS — I679 Cerebrovascular disease, unspecified: Secondary | ICD-10-CM | POA: Diagnosis not present

## 2017-06-05 DIAGNOSIS — R5383 Other fatigue: Secondary | ICD-10-CM | POA: Diagnosis not present

## 2017-06-09 MED FILL — UNIFINE PENTIPS 32GX5/32": 32G X 4 MM | 25 days supply | Qty: 100 | Fill #4

## 2017-06-09 MED FILL — UNIFINE PENTIPS 32GX5/32: 32G X 4 MM | 25 days supply | Qty: 100 | Fill #4

## 2017-06-11 ENCOUNTER — Other Ambulatory Visit: Payer: Self-pay | Admitting: *Deleted

## 2017-06-11 MED FILL — ACCU-CHEK GUIDE TEST STRIP: 50 days supply | Qty: 100 | Fill #4

## 2017-06-11 NOTE — Patient Outreach (Addendum)
Rebecca Wagner called this RNCM requesting assist in enrolling in the [facility] digital assistant platform for Type II DM self management. Enrolled her in the platform via the [employer] website and then notified [employer] staff of Rebecca Wagner's request to onboard as soon as possible. [employer] onboarding appointment was scheduled for 06/12/17 at 11:00 am. This RNCM built and published her care plan in preparation for Rebecca Wagner's onboarding on 06/12/17. This RNCM will assist her with diabetes self management via the [facility] platform beginning 06/12/17. Rebecca RichardJanet S. [name] RN,CCM,CDE [facility] Care Management Coordinator Link To Wellness and [employer] Office Phone [phone] Office Fax [phone]

## 2017-06-16 DIAGNOSIS — I679 Cerebrovascular disease, unspecified: Secondary | ICD-10-CM | POA: Diagnosis not present

## 2017-06-16 DIAGNOSIS — E119 Type 2 diabetes mellitus without complications: Secondary | ICD-10-CM | POA: Diagnosis not present

## 2017-06-16 DIAGNOSIS — I10 Essential (primary) hypertension: Secondary | ICD-10-CM | POA: Diagnosis not present

## 2017-06-16 MED FILL — FAMOTIDINE 20 MG TABLET: 20 | 90 days supply | Qty: 180 | Fill #0

## 2017-06-16 MED FILL — ATORVASTATIN 80 MG TABLET: 80 | 90 days supply | Qty: 90 | Fill #0

## 2017-06-16 MED FILL — HYDROCHLOROTHIAZIDE 12.5 MG: 12.5 | 90 days supply | Qty: 90 | Fill #0

## 2017-07-07 MED FILL — CLOPIDOGREL 75 MG TABLET: 75 | 30 days supply | Qty: 30 | Fill #1

## 2017-07-07 MED FILL — cloNIDine HCL 0.2 MG TABS: 0.2 | 30 days supply | Qty: 60 | Fill #0

## 2017-07-07 MED FILL — ASPIRIN 81 MG CHEWABLE TAB: 81 | 30 days supply | Qty: 30 | Fill #0

## 2017-07-08 MED FILL — CARVEDILOL 25 MG TABLET: 25 | 60 days supply | Qty: 30 | Fill #0

## 2017-07-22 MED FILL — CARVEDILOL 25 MG TABLET: 25 | 30 days supply | Qty: 60 | Fill #0

## 2017-07-22 MED FILL — LANTUS SOLOSTAR 100 UNITS/M: 100 | 30 days supply | Qty: 15 | Fill #0

## 2017-08-01 MED FILL — CLOPIDOGREL 75 MG TABLET: 75 | 30 days supply | Qty: 30 | Fill #0

## 2017-08-01 MED FILL — ASPIRIN CHILD 81 MG TAB CHE: 81 | 30 days supply | Qty: 30 | Fill #1

## 2017-08-01 MED FILL — LOSARTAN POTASSIUM 100 MG T: 100 | 90 days supply | Qty: 90 | Fill #2

## 2017-08-21 MED FILL — CARVEDILOL 25 MG TABLET: 25 | 30 days supply | Qty: 60 | Fill #1

## 2017-08-21 MED FILL — DILTIAZEM 24HR ER 180 MG TA: 180 | 30 days supply | Qty: 30 | Fill #1

## 2017-08-21 MED FILL — cloNIDine HCL 0.2 MG TABS: 0.2 | 30 days supply | Qty: 60 | Fill #1

## 2017-08-25 MED FILL — ACCU-CHEK GUIDE TEST STRIP: 50 days supply | Qty: 100 | Fill #5

## 2017-08-25 MED FILL — CLOPIDOGREL 75 MG TABLET: 75 | 30 days supply | Qty: 30 | Fill #1

## 2017-09-01 DIAGNOSIS — I10 Essential (primary) hypertension: Secondary | ICD-10-CM | POA: Diagnosis not present

## 2017-09-01 DIAGNOSIS — E119 Type 2 diabetes mellitus without complications: Secondary | ICD-10-CM | POA: Diagnosis not present

## 2017-09-01 DIAGNOSIS — I679 Cerebrovascular disease, unspecified: Secondary | ICD-10-CM | POA: Diagnosis not present

## 2017-09-01 MED FILL — DICLOFENAC SODIUM 1% GEL: 1 | 30 days supply | Qty: 100 | Fill #3

## 2017-09-01 MED FILL — HUMALOG 100 UNITS/ML KWIKPE: 100 | 30 days supply | Qty: 9 | Fill #3

## 2017-09-01 MED FILL — LANTUS SOLOSTAR 100 UNITS/M: 100 | 30 days supply | Qty: 15 | Fill #1

## 2017-09-03 MED FILL — traMADol HCL 50 MG TABS: 50 | 30 days supply | Qty: 120 | Fill #0

## 2017-09-10 MED FILL — ASPIRIN CHILD 81 MG TAB CHE: 81 | 30 days supply | Qty: 30 | Fill #2

## 2017-09-10 MED FILL — GABAPENTIN 300 MG CAPSULE: 300 | 30 days supply | Qty: 90 | Fill #1

## 2017-09-10 MED FILL — ATORVASTATIN 80 MG TABLET: 80 | 90 days supply | Qty: 90 | Fill #1

## 2017-09-19 MED FILL — DILTIAZEM 24HR ER 180 MG TA: 180 | 30 days supply | Qty: 30 | Fill #2

## 2017-09-19 MED FILL — CLOPIDOGREL 75 MG TABLET: 75 | 30 days supply | Qty: 30 | Fill #2

## 2017-09-30 MED FILL — CARVEDILOL 25 MG TABLET: 25 | 30 days supply | Qty: 60 | Fill #0

## 2017-10-06 MED FILL — FAMOTIDINE 20 MG TABLET: 20 | 90 days supply | Qty: 180 | Fill #1

## 2017-10-13 MED FILL — cloNIDine HCL 0.2 MG TABS: 0.2 | 30 days supply | Qty: 60 | Fill #2

## 2017-10-16 DIAGNOSIS — E119 Type 2 diabetes mellitus without complications: Secondary | ICD-10-CM | POA: Diagnosis not present

## 2017-10-16 DIAGNOSIS — I679 Cerebrovascular disease, unspecified: Secondary | ICD-10-CM | POA: Diagnosis not present

## 2017-10-16 DIAGNOSIS — I10 Essential (primary) hypertension: Secondary | ICD-10-CM | POA: Diagnosis not present

## 2017-10-16 MED FILL — CANDESARTAN-HCTZ 32-25 MG T: 32-25 | 30 days supply | Qty: 30 | Fill #0

## 2017-10-16 MED FILL — DICLOFENAC SODIUM 1% GEL: 1 | 13 days supply | Qty: 100 | Fill #0

## 2017-10-21 MED FILL — DILTIAZEM 24HR ER 180 MG TA: 180 | 30 days supply | Qty: 30 | Fill #0

## 2017-11-01 IMAGING — CT CT ANGIO NECK
1 of 8 series · 3 of 33 positions shown · IV contrast (Omni 300)
Comparison: MRI head 04/11/2016

CLINICAL DATA: Stroke

EXAM:
CT ANGIOGRAPHY HEAD AND NECK
TECHNIQUE: Multidetector CT imaging of the head and neck was performed using
the standard protocol during bolus administration of intravenous
contrast. Multiplanar CT image reconstructions and MIPs were
obtained to evaluate the vascular anatomy. Carotid stenosis
measurements (when applicable) are obtained utilizing NASCET
criteria, using the distal internal carotid diameter as the
denominator.
CONTRAST:  50 mL Isovue 370 IV

[Series 5: cow 2.0 · axial · 0.43mm/px · z∈[-418,-86]mm · 3 of 167 slices shown]
[im 1/167  soft-tissue]
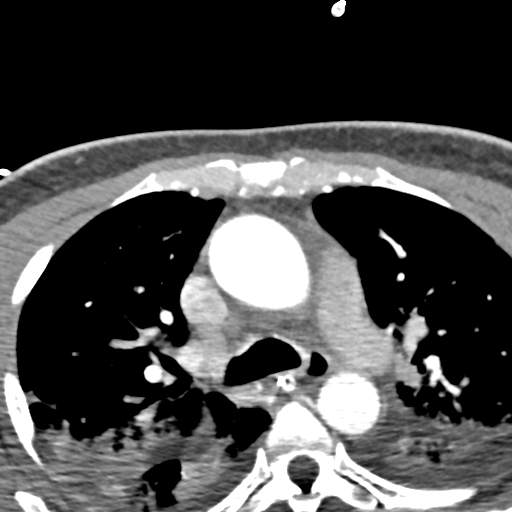
[im 84/167  bone]
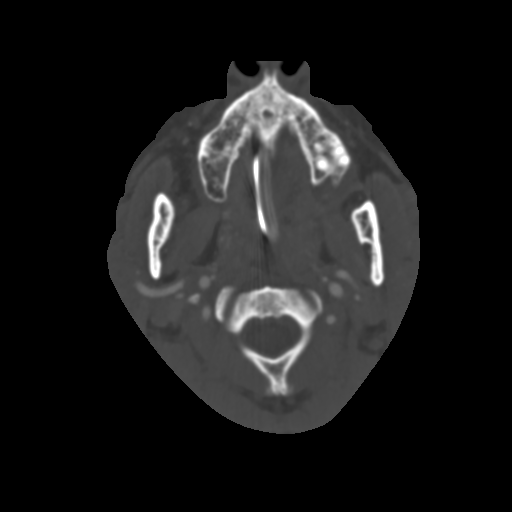
[im 167/167  soft-tissue]
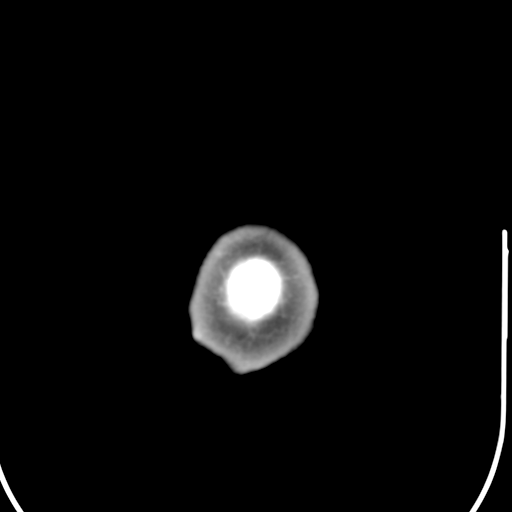

[3 of 33 positions shown; findings below may reference images not displayed]

FINDINGS: CTA NECK FINDINGS

Aortic arch: Mild atherosclerotic calcification aortic arch without
aneurysm or dissection. Proximal great vessels widely patent with
mild atherosclerotic disease of the origin of the left subclavian
artery.

Right carotid system: Right common carotid artery widely patent
without stenosis. Mild atherosclerotic disease at the right carotid
bifurcation without significant stenosis or dissection

Left carotid system: Left common carotid artery widely patent with
scattered areas of mild atherosclerotic calcification. Mild
atherosclerotic disease in the carotid bulb on the left without
significant carotid stenosis. No dissection.

Vertebral arteries: Both vertebral arteries are equal in size and
patent to the basilar. Proximal vertebral arteries widely patent.
Atherosclerotic calcification and stenosis of the distal vertebral
artery at the level of the dura. Moderate to severe stenosis on the
left and moderate stenosis on the right.

Skeleton: Negative

Other neck: Negative for mass or adenopathy.

Upper chest: Endotracheal tube just above the carina. No mass or
adenopathy in the neck. NG tube in the esophagus. Small pleural
effusions bibasilar bilaterally with bibasilar atelectasis.

Review of the MIP images confirms the above findings

CTA HEAD FINDINGS

Anterior circulation: Extensive atherosclerotic calcification in the
cavernous carotid bilaterally causing moderate to severe stenosis
bilaterally. Carotid in remains patent bilaterally. Anterior and
middle cerebral arteries patent bilaterally without significant
stenosis. Negative for aneurysm.

Posterior circulation: Moderate to severe stenosis distal left
vertebral artery and moderate stenosis distal right vertebral
artery. PICA patent bilaterally. Basilar widely patent. Superior
cerebellar and posterior cerebral arteries patent bilaterally.
Severe stenosis proximal left posterior cerebral artery and mild
stenosis distal left posterior cerebral artery. Mild stenosis
proximal right posterior cerebral artery. No aneurysm in the
posterior circulation

Venous sinuses: Patent

Anatomic variants: None

Delayed phase: Normal enhancement on delayed imaging. Chronic
microvascular ischemic change throughout the white matter, advanced
for age. No intracranial hemorrhage or mass.

Review of the MIP images confirms the above findings
IMPRESSION: Mild atherosclerotic disease of the carotid bifurcation bilaterally
without significant carotid stenosis. Moderate to severe stenosis of
the cavernous carotid bilaterally.

Moderate to severe stenosis distal left vertebral artery and
moderate stenosis distal right vertebral artery.

Severe stenosis proximal left posterior cerebral artery with diffuse
disease in the posterior cerebral artery bilaterally.

Chronic microvascular ischemic change in the white matter, advanced
for age

Small bilateral effusions with bibasilar atelectasis.

## 2017-11-03 MED FILL — HYDROCHLOROTHIAZIDE 12.5 MG: 12.5 | 90 days supply | Qty: 90 | Fill #1

## 2017-11-03 MED FILL — CLOPIDOGREL 75 MG TABLET: 75 | 30 days supply | Qty: 30 | Fill #0

## 2017-11-03 MED FILL — GABAPENTIN 300 MG CAPSULE: 300 | 30 days supply | Qty: 90 | Fill #0

## 2017-11-03 MED FILL — LANTUS SOLOSTAR 100 UNITS/M: 100 | 30 days supply | Qty: 15 | Fill #2

## 2017-11-03 MED FILL — CARVEDILOL 25 MG TABLET: 25 | 30 days supply | Qty: 60 | Fill #1

## 2017-11-06 MED FILL — ACCU-CHEK GUIDE STRP: 30 days supply | Qty: 100 | Fill #0

## 2017-12-01 MED FILL — DICLOFENAC SODIUM 1% GEL: 1 | 13 days supply | Qty: 100 | Fill #1

## 2017-12-01 MED FILL — CLOPIDOGREL 75 MG TABLET: 75 | 30 days supply | Qty: 30 | Fill #1

## 2017-12-01 MED FILL — DILTIAZEM 24HR ER 180 MG TA: 180 | 30 days supply | Qty: 30 | Fill #1

## 2017-12-01 MED FILL — CARVEDILOL 25 MG TABLET: 25 | 30 days supply | Qty: 60 | Fill #0

## 2017-12-02 MED FILL — LANTUS SOLOSTAR 100 UNITS/M: 100 | 30 days supply | Qty: 15 | Fill #3

## 2017-12-24 DIAGNOSIS — I10 Essential (primary) hypertension: Secondary | ICD-10-CM | POA: Diagnosis not present

## 2017-12-24 DIAGNOSIS — I679 Cerebrovascular disease, unspecified: Secondary | ICD-10-CM | POA: Diagnosis not present

## 2017-12-24 DIAGNOSIS — E119 Type 2 diabetes mellitus without complications: Secondary | ICD-10-CM | POA: Diagnosis not present

## 2017-12-24 DIAGNOSIS — R5383 Other fatigue: Secondary | ICD-10-CM | POA: Diagnosis not present

## 2017-12-24 MED FILL — GABAPENTIN 400 MG CAPSULE: 400 | 30 days supply | Qty: 90 | Fill #0

## 2017-12-24 MED FILL — cloNIDine HCL 0.2 MG TABS: 0.2 | 30 days supply | Qty: 60 | Fill #0

## 2017-12-24 MED FILL — PRAVASTATIN SODIUM 20 MG TA: 20 | 30 days supply | Qty: 30 | Fill #0

## 2018-01-08 MED FILL — CLOPIDOGREL 75 MG TABLET: 75 | 30 days supply | Qty: 30 | Fill #2

## 2018-01-08 MED FILL — DILTIAZEM 24HR ER 180 MG TA: 180 | 30 days supply | Qty: 30 | Fill #2

## 2018-01-08 MED FILL — CARVEDILOL 25 MG TABLET: 25 | 30 days supply | Qty: 60 | Fill #1

## 2018-01-08 MED FILL — FAMOTIDINE 20 MG TABLET: 20 | 90 days supply | Qty: 180 | Fill #2

## 2018-02-09 MED FILL — CARVEDILOL 25 MG TABLET: 25 | 30 days supply | Qty: 60 | Fill #0

## 2018-02-09 MED FILL — DILTIAZEM 24HR ER 180 MG TA: 180 | 30 days supply | Qty: 30 | Fill #3

## 2018-02-09 MED FILL — HYDROCHLOROTHIAZIDE 12.5 MG: 12.5 | 90 days supply | Qty: 90 | Fill #2

## 2018-02-09 MED FILL — CLOPIDOGREL 75 MG TABLET: 75 | 30 days supply | Qty: 30 | Fill #0

## 2018-02-09 MED FILL — GABAPENTIN 400 MG CAPSULE: 400 | 30 days supply | Qty: 90 | Fill #1

## 2018-02-23 MED FILL — DICLOFENAC SODIUM 1% GEL: 1 | 13 days supply | Qty: 100 | Fill #2

## 2018-02-24 DIAGNOSIS — E1121 Type 2 diabetes mellitus with diabetic nephropathy: Secondary | ICD-10-CM | POA: Diagnosis not present

## 2018-02-24 DIAGNOSIS — Z8673 Personal history of transient ischemic attack (TIA), and cerebral infarction without residual deficits: Secondary | ICD-10-CM | POA: Diagnosis not present

## 2018-02-24 DIAGNOSIS — I10 Essential (primary) hypertension: Secondary | ICD-10-CM | POA: Diagnosis not present

## 2018-02-24 DIAGNOSIS — Z87891 Personal history of nicotine dependence: Secondary | ICD-10-CM | POA: Diagnosis not present

## 2018-02-24 MED FILL — PRAVASTATIN SODIUM 20 MG TA: 20 | 30 days supply | Qty: 30 | Fill #1

## 2018-02-24 MED FILL — CARTIA XT 180 MG CAPSULE SA: 180 | 90 days supply | Qty: 90 | Fill #0

## 2018-02-27 MED FILL — HUMALOG 100 UNITS/ML KWIKPE: 100 | 50 days supply | Qty: 15 | Fill #0

## 2018-02-27 MED FILL — LANTUS SOLOSTAR 100 UNITS/M: 100 | 38 days supply | Qty: 15 | Fill #0

## 2018-02-27 MED FILL — ACCU-CHEK GUIDE STRP: 30 days supply | Qty: 100 | Fill #1

## 2018-03-09 MED FILL — CARVEDILOL 25 MG TABLET: 25 | 30 days supply | Qty: 60 | Fill #1

## 2018-03-10 MED FILL — traMADol HCL 50 MG TABS: 50 | 30 days supply | Qty: 120 | Fill #0

## 2018-03-10 MED FILL — cloNIDine HCL 0.2 MG TABS: 0.2 | 30 days supply | Qty: 60 | Fill #1

## 2018-03-13 MED FILL — CLOPIDOGREL 75 MG TABLET: 75 | 30 days supply | Qty: 30 | Fill #1

## 2018-03-18 ENCOUNTER — Ambulatory Visit: Payer: 59 | Admitting: Sports Medicine

## 2018-03-18 ENCOUNTER — Encounter: Payer: Self-pay | Admitting: Sports Medicine

## 2018-03-18 VITALS — BP 146/90 | HR 66 | Ht 64.0 in | Wt 217.2 lb

## 2018-03-18 DIAGNOSIS — M25559 Pain in unspecified hip: Secondary | ICD-10-CM | POA: Diagnosis not present

## 2018-03-18 DIAGNOSIS — M9903 Segmental and somatic dysfunction of lumbar region: Secondary | ICD-10-CM

## 2018-03-18 DIAGNOSIS — M9905 Segmental and somatic dysfunction of pelvic region: Secondary | ICD-10-CM

## 2018-03-18 DIAGNOSIS — M9908 Segmental and somatic dysfunction of rib cage: Secondary | ICD-10-CM | POA: Diagnosis not present

## 2018-03-18 MED ORDER — CYCLOBENZAPRINE HCL 5 MG PO TABS: 5.0000 mg | ORAL_TABLET | Freq: Every day | ORAL | 1 refills | Status: DC

## 2018-03-18 MED FILL — CYCLOBENZAPRINE 5 MG TABLET: 5 | 30 days supply | Qty: 30 | Fill #0

## 2018-03-18 NOTE — Progress Notes (Signed)
Rebecca Wagner. Rebecca [facility] [phone]  Rebecca Wagner - 57 y.o. female MRN [id number]  Date of birth: 01/09/1961  Visit Date: 03/18/2018  PCP: Leilani Able, MD   Referred by: Leilani Able, MD   Scribe(s) for today's visit: [name], CMA  SUBJECTIVE:  Rebecca Wagner is here for Initial Assessment (R hip pain)   HPI: Her R hip pain symptoms INITIALLY: Began about 2 weeks ago and began when bending over the clean her tub. She stool to walk to the bedroom and felt a sharp pain down her R leg.  Described as moderate sharp pain, radiating to the leg but not beyond the ankle.  Worsened with walking, sitting and extending her R leg, sit-to-stand.  Improved with heat.  Additional associated symptoms include: Hx of LBP, L hip pain (2008). She denies current LBP. She reports recent weight gain d/t sedentary job and snack choices. She denies loss of control of bladder or bowel function.     At this time symptoms are improving compared to onset.  She has been taking Gabapentin 400 mg at bedtime per PCP with some relief. Last week she took the Gabapentin BID, it made her feel sleepy. Prior to taking Gabapentin she had tried Tylenol arthritis with no relief. She can't take IBU, post heart attack and stroke per pt. Since the pain started she has been ambulating with a cane, this helps her support herself.   CT abd/pelv 01/2016  REVIEW OF SYSTEMS: Reports night time disturbances. Denies fevers, chills, or night sweats. Denies unexplained weight loss. Denies personal history of cancer. Reports changes in bowel or bladder habits - nocturia, diabetic. Reports recent unreported falls - landed on L hip. Denies new or worsening dyspnea or wheezing. Denies headaches or dizziness.  Reports numbness, tingling or weakness RLE.  Denies dizziness or presyncopal episodes Denies lower extremity edema.   HISTORY:  Prior history  reviewed and updated per electronic medical record.  Social History   Occupational History  . Not on file  Tobacco Use  . Smoking status: Former Games developer  . Smokeless tobacco: Never Used  . Tobacco comment: quit smoking in 2010  Substance and Sexual Activity  . Alcohol use: No  . Drug use: No  . Sexual activity: Yes    Birth control/protection: Post-menopausal   Social History   Social History Narrative  . Not on file    Past Medical History:  Diagnosis Date  . Arthritis   . Depression   . Diabetes mellitus without complication (HCC)   . Hypertension    Past Surgical History:  Procedure Laterality Date  . CARDIAC CATHETERIZATION N/A 04/10/2016   Procedure: Left Heart Cath and Coronary Angiography;  Surgeon: Iran Ouch, MD;  Location: [facility] INVASIVE CV LAB;  Service: Cardiovascular;  Laterality: N/A;  . CESAREAN SECTION    . LAPAROSCOPIC APPENDECTOMY N/A 02/29/2016   Procedure: LAPAROSCOPIC APPENDECTOMY;  Surgeon: Axel Filler, MD;  Location: [facility] OR;  Service: General;  Laterality: N/A;   family history includes Diabetes in her mother; Heart disease in her father.  DATA OBTAINED & REVIEWED:   Recent Labs    03/27/17 1037 06/03/17 1117  HGBA1C 9.0 8.5   .   OBJECTIVE:  VS:  HT:5\' 4"  (162.6 cm)   WT:217 lb 3.2 oz (98.5 kg)  BMI:37.26    BP:(!) 146/90  HR:66bpm  TEMP: ( )  RESP:99 %   PHYSICAL EXAM: CONSTITUTIONAL: Well-developed, Well-nourished  and In no acute distress PSYCHIATRIC: Alert & appropriately interactive. and Not depressed or anxious appearing. RESPIRATORY: No increased work of breathing and Trachea Midline EYES: Pupils are equal., EOM intact without nystagmus. and No scleral icterus.  VASCULAR EXAM: Warm and well perfused NEURO: unremarkable Normal associated myotomal distribution strength to manual muscle testing Normal sensation to light touch Normal and symmetric associated DTRs  MSK Exam: Back and hips:  Well aligned, no  significant deformity. No overlying skin changes. TTP over Bilateral greater trochanters.  More pain directly over the gluteal musculature. Non tender over Midline of her spine.   RANGE OF MOTION & STRENGTH  Good internal and external rotation of bilateral hips.  No restrictions are pain-free.  Negative straight leg raise bilaterally.   SPECIALITY TESTING:  Hips have good internal and external range of motion with only pain over the gluteal musculature.  She has a negative FADIR and Pearlean BrownieFaber test although slightly painful at terminal FADIR on the left not right.  Able to heel toe walk without difficulty.     ASSESSMENT   1. Greater trochanteric pain syndrome   2. Somatic dysfunction of lumbar region   3. Somatic dysfunction of pelvis region   4. Somatic dysfunction of rib cage region     PLAN:  Pertinent additional documentation may be included in corresponding procedure notes, imaging studies, problem based documentation and patient instructions.  Procedures:  . Osteopathic manipulation was performed today based on physical exam findings.  Please see procedure note for further information including Osteopathic Exam findings . Discussed the foundation of treatment for this condition is physical therapy and/or daily (5-6 days/week) therapeutic exercises, focusing on core strengthening, coordination, neuromuscular control/reeducation.  Therapeutic exercises prescribed per procedure note.  Medications:  Meds ordered this encounter  Medications  . cyclobenzaprine (FLEXERIL) 5 MG tablet    Sig: Take 1 tablet (5 mg total) by mouth at bedtime.    Dispense:  30 tablet    Refill:  1   Discussion/Instructions: No problem-specific Assessment & Plan notes found for this encounter.  . Discussed the underlying features of tight hip flexors leading to crouched, fetal like position that results in spinal column compression.  Including lumbar hyperflexion with hypermobility, thoracic flexion with  restrictive rotation and cervical lordosis reversal  . Links to [employer] videos provided today per Patient Instructions.  These exercises were developed by Myles LippsEric Goodman, DC with a strong emphasis on core neuromuscular reducation and postural realignment through body-weight exercises. . Discussed red flag symptoms that warrant earlier emergent evaluation and patient voices understanding. . Activity modifications and the importance of avoiding exacerbating activities (limiting pain to no more than a 4 / 10 during or following activity) recommended and discussed.  Follow-up:  . Return in about 4 weeks (around 04/15/2018) for repeat clinical exam.   . If any lack of improvement consider: further diagnostic evaluation with X-ray lumbar spine and/or hips  . At follow up will plan to consider: repeat osteopathic manipulation     CMA/ATC served as scribe during this visit. History, Physical, and Plan performed by medical provider. Documentation and orders reviewed and attested to.      Andrena MewsMichael D Brighid Koch, DO    [facility] Physician

## 2018-03-18 NOTE — Progress Notes (Signed)
PROCEDURE NOTE : OSTEOPATHIC MANIPULATION The decision today to treat with Osteopathic Manipulative Therapy (OMT) was based on physical exam findings. Verbal consent was obtained following a discussion with the patient regarding the of risks, benefits and potential side effects, including an acute pain flare,post manipulation soreness and need for repeat treatments.     Contraindications to OMT: NONE  Manipulation was performed as below: Regions Treated OMT Techniques Used  Lumbar spine Pelvis Sacrum HVLA muscle energy myofascial release   The patient tolerated the treatment well and reported Improved symptoms following treatment today. Patient was given medications, exercises, stretches and lifestyle modifications per AVS and verbally.   OSTEOPATHIC/STRUCTURAL EXAM:   L4 FRS right (Flexed, Rotated & Sidebent) Right psoas spasm Right anterior innonimate L on L sacral torsion

## 2018-03-18 NOTE — Progress Notes (Signed)
PROCEDURE NOTE: THERAPEUTIC EXERCISES (97110) 15 minutes spent for Therapeutic exercises as below and as referenced in the AVS.  This included exercises focusing on stretching, strengthening, with significant focus on eccentric aspects.   Proper technique shown and discussed handout in great detail with ATC.  All questions were discussed and answered.   Long term goals include an improvement in range of motion, strength, endurance as well as avoiding reinjury. Frequency of visits is one time as determined during today's  office visit. Frequency of exercises to be performed is as per handout.  EXERCISES REVIEWED:  Pelvic recruitment  Hip ABduction strengthening with focus on Glute Medius Recruitment

## 2018-03-18 NOTE — Patient Instructions (Addendum)
Also check out [employer] Training" which is a program developed by Dr. Myles LippsEric Goodman.   There are links to a couple of his YouTube Videos below and I would like to see performing one of his videos 5-6 days per week.    A good intro video is: "Independence from Pain 7-minute Video" - [email or web address]   Exercises that focus more on the neck are as below: Dr. Derrill KayGoodman with [employer] teaching neck and shoulder details Part 1 - https://youtu.be/[email or web address] Part 2 Dr. Derrill KayGoodman with [facility] [employer] quick routine to practice daily - https://youtu.be/[email or web address]  Do not try to attempt the entire video when first beginning.    Try breaking of each exercise that he goes into shorter segments.  Otherwise if they perform an exercise for 45 seconds, start with 15 seconds and rest and then resume when they begin the new activity.  If you work your way up to being able to do these videos without having to stop, I expect you will see significant improvements in your pain.  If you enjoy his videos and would like to find out more you can look on his website: [email or web address].  He has a workout streaming option as well as a DVD set available for purchase.  Amazon has the best price for his DVDs.     Please perform the exercise program that we have prepared for you and gone over in detail on a daily basis.  In addition to the handout you were provided you can access your program through: [email or web address]   Your unique program code is:  [email or web address]

## 2018-03-22 ENCOUNTER — Encounter: Payer: Self-pay | Admitting: Sports Medicine

## 2018-03-26 MED FILL — ACCU-CHEK GUIDE STRP: 30 days supply | Qty: 100 | Fill #2

## 2018-03-26 MED FILL — DICLOFENAC SODIUM 1% GEL: 1 | 13 days supply | Qty: 100 | Fill #3

## 2018-04-08 MED FILL — LANTUS SOLOSTAR 100 UNITS/M: 100 | 38 days supply | Qty: 15 | Fill #1

## 2018-04-08 MED FILL — HUMALOG 100 UNITS/ML KWIKPE: 100 | 50 days supply | Qty: 15 | Fill #1

## 2018-04-13 MED FILL — CYCLOBENZAPRINE 5 MG TABLET: 5 | 30 days supply | Qty: 30 | Fill #1

## 2018-04-13 MED FILL — PRAVASTATIN SODIUM 20 MG TA: 20 | 30 days supply | Qty: 30 | Fill #2

## 2018-04-17 ENCOUNTER — Ambulatory Visit: Payer: 59 | Admitting: Sports Medicine

## 2018-04-17 ENCOUNTER — Encounter: Payer: Self-pay | Admitting: Sports Medicine

## 2018-04-17 VITALS — BP 160/90 | HR 78 | Ht 64.0 in | Wt 223.2 lb

## 2018-04-17 DIAGNOSIS — R262 Difficulty in walking, not elsewhere classified: Secondary | ICD-10-CM | POA: Diagnosis not present

## 2018-04-17 DIAGNOSIS — I63512 Cerebral infarction due to unspecified occlusion or stenosis of left middle cerebral artery: Secondary | ICD-10-CM

## 2018-04-17 DIAGNOSIS — R29898 Other symptoms and signs involving the musculoskeletal system: Secondary | ICD-10-CM | POA: Diagnosis not present

## 2018-04-17 DIAGNOSIS — M25512 Pain in left shoulder: Secondary | ICD-10-CM

## 2018-04-17 DIAGNOSIS — M25511 Pain in right shoulder: Secondary | ICD-10-CM

## 2018-04-17 DIAGNOSIS — R269 Unspecified abnormalities of gait and mobility: Secondary | ICD-10-CM | POA: Diagnosis not present

## 2018-04-17 DIAGNOSIS — G8929 Other chronic pain: Secondary | ICD-10-CM | POA: Diagnosis not present

## 2018-04-17 DIAGNOSIS — M25559 Pain in unspecified hip: Secondary | ICD-10-CM

## 2018-04-17 MED ORDER — DICLOFENAC SODIUM 1 % TD GEL: TRANSDERMAL | 1 refills | Status: DC

## 2018-04-17 MED ORDER — CYCLOBENZAPRINE HCL 10 MG PO TABS: 10.0000 mg | ORAL_TABLET | Freq: Every day | ORAL | 3 refills | Status: AC

## 2018-04-17 MED FILL — DICLOFENAC SODIUM 1% GEL: 1 | 12 days supply | Qty: 100 | Fill #0

## 2018-04-17 NOTE — Progress Notes (Signed)
Rebecca Wagner. Rebecca Wagner [phone]  Rebecca Wagner - 57 y.o. female MRN [id number]  Date of birth: September 14, 1960  Visit Date: 04/17/2018  PCP: Leilani Able, MD   Referred by: Leilani Able, MD  Scribe(s) for today's visit: Christoper Fabian, LAT, ATC  SUBJECTIVE:  Rebecca Wagner is here for Follow-up (Bilateral hip pain)   HPI: Her R hip pain symptoms INITIALLY: Began about 2 weeks ago and began when bending over the clean her tub. She stool to walk to the bedroom and felt a sharp pain down her R leg.  Described as moderate sharp pain, radiating to the leg but not beyond the ankle.  Worsened with walking, sitting and extending her R leg, sit-to-stand.  Improved with heat.  Additional associated symptoms include: Hx of LBP, L hip pain (2008). She denies current LBP. She reports recent weight gain d/t sedentary job and snack choices. She denies loss of control of bladder or bowel function.     At this time symptoms are improving compared to onset.  She has been taking Gabapentin 400 mg at bedtime per PCP with some relief. Last week she took the Gabapentin BID, it made her feel sleepy. Prior to taking Gabapentin she had tried Tylenol arthritis with no relief. She can't take IBU, post heart attack and stroke per pt. Since the pain started she has been ambulating with a cane, this helps her support herself.   04/17/2018: Compared to the last office visit on 03/08/18, her previously described R hip symptoms are improving and she notes 85-90% improvement.  Pt states that she would like to request some exercises for her arms due to chest and UE tightness and discomfort, a refill on her Flexeril and an rx for Voltaren gel.  She states that she also would like to get an rx for PT for her prior stroke and heart attack. Current symptoms are mild & are radiating to R LE She has been taking Flexeril 5mg  nightly and has not been taking  Gabapentin.  She has been doing [employer] and her HEP.  CT abd/pelv 01/2016; L hip XR - 10/06/2006; L hip MRI - 10/25/2006  REVIEW OF SYSTEMS: Denies night time disturbances. Denies fevers, chills, or night sweats. Denies unexplained weight loss. Denies personal history of cancer. Reports changes in bowel or bladder habits - nocturia, diabetic. Denies recent unreported falls - landed on L hip. Denies new or worsening dyspnea or wheezing. Denies headaches or dizziness.  Reports numbness, tingling or weakness RLE.  Denies dizziness or presyncopal episodes Denies lower extremity edema.   HISTORY:  Prior history reviewed and updated per electronic medical record.  Social History   Occupational History  . Not on file  Tobacco Use  . Smoking status: Former Games developer  . Smokeless tobacco: Never Used  . Tobacco comment: quit smoking in 2010  Substance and Sexual Activity  . Alcohol use: No  . Drug use: No  . Sexual activity: Yes    Birth control/protection: Post-menopausal   Social History   Social History Narrative  . Not on file    Past Medical History:  Diagnosis Date  . Arthritis   . Depression   . Diabetes mellitus without complication (HCC)   . Hypertension    Past Surgical History:  Procedure Laterality Date  . CARDIAC CATHETERIZATION N/A 04/10/2016   Procedure: Left Heart Cath and Coronary Angiography;  Surgeon: Iran Ouch, MD;  Location: [facility]  INVASIVE CV LAB;  Service: Cardiovascular;  Laterality: N/A;  . CESAREAN SECTION    . LAPAROSCOPIC APPENDECTOMY N/A 02/29/2016   Procedure: LAPAROSCOPIC APPENDECTOMY;  Surgeon: Axel Filler, MD;  Location: [facility] OR;  Service: General;  Laterality: N/A;   family history includes Diabetes in her mother; Heart disease in her father.  DATA OBTAINED & REVIEWED:   Recent Labs    06/03/17 1117  HGBA1C 8.5   .   OBJECTIVE:  VS:  HT:5\' 4"  (162.6 cm)   WT:223 lb 3.2 oz (101.2 kg)  BMI:38.29    BP:(!) 160/90   HR:78bpm  TEMP: ( )  RESP:97 %   PHYSICAL EXAM: CONSTITUTIONAL: Well-developed, Well-nourished and In no acute distress PSYCHIATRIC: Alert & appropriately interactive. and Not depressed or anxious appearing. RESPIRATORY: No increased work of breathing and Trachea Midline EYES: Pupils are equal., EOM intact without nystagmus. and No scleral icterus.  VASCULAR EXAM: Warm and well perfused NEURO: unremarkable Normal associated myotomal distribution strength to manual muscle testing Normal sensation to light touch Normal and symmetric associated DTRs  MSK Exam: Back and hips:  Well aligned, no significant deformity. No overlying skin changes. Prior tenderness over bilateral hips is significantly improved.  She has good internal and external range of motion bilateral hips.  Negative straight leg raise.   SPECIALITY TESTING:  Hips have good internal and external range of motion with only pain over the gluteal musculature.  She has a negative FADIR and Pearlean Brownie test although slightly painful at terminal FADIR on the left not right.  Able to heel toe walk without difficulty.    Neck is overall good range of motion.  She has negative Spurling's compression test bilaterally.  Upper extremity strength is 5/5 diffusely.  ASSESSMENT   1. Difficulty walking   2. Greater trochanteric pain syndrome   3. Weakness of both hips   4. Gait disturbance   5. Left Frontal-Parietal CVA   6. Chronic pain of both shoulders     PLAN:  Pertinent additional documentation may be included in corresponding procedure notes, imaging studies, problem based documentation and patient instructions.  Procedures:  . None  Medications:  Meds ordered this encounter  Medications  . cyclobenzaprine (FLEXERIL) 10 MG tablet    Sig: Take 1 tablet (10 mg total) by mouth at bedtime.    Dispense:  90 tablet    Refill:  3  . diclofenac sodium (VOLTAREN) 1 % GEL    Sig: Apply topically to affected area qid    Dispense:   100 g    Refill:  1   Discussion/Instructions: No problem-specific Assessment & Plan notes found for this encounter.  . Overall she is better.  Is reporting difficulty with ambulation is unsteady.  She has not regained incontinence following her CVA and feels that this is significantly limiting her exercise.  Referral to neuro rehab for gait training and upper extremity strengthening requested. . Voltaren gel to be used as needed for bilateral knees and hands.  Flexeril prescription provided at increased dose . Links to [employer] provided today per Patient Instructions.  These exercises were developed by Myles Lipps, DC with a strong emphasis on core neuromuscular reducation and postural realignment through body-weight exercises. . Discussed red flag symptoms that warrant earlier emergent evaluation and patient voices understanding. . Activity modifications and the importance of avoiding exacerbating activities (limiting pain to no more than a 4 / 10 during or following activity) recommended and discussed.  Follow-up:  . Return in  about 8 weeks (around 06/12/2018).   . If any lack of improvement consider: further diagnostic evaluation with Of the cervical and lower lumbar spine.     CMA/ATC served as Neurosurgeon during this visit. History, Physical, and Plan performed by medical provider. Documentation and orders reviewed and attested to.      Andrena Mews, DO    [facility] Physician

## 2018-04-20 MED FILL — CARVEDILOL 25 MG TABLET: 25 | 30 days supply | Qty: 60 | Fill #2

## 2018-04-20 MED FILL — CLOPIDOGREL 75 MG TABLET: 75 | 30 days supply | Qty: 30 | Fill #2

## 2018-04-20 MED FILL — CANDESARTAN-HCTZ 32-25 MG T: 32-25 | 30 days supply | Qty: 30 | Fill #1

## 2018-05-06 ENCOUNTER — Ambulatory Visit: Payer: 59 | Admitting: Sports Medicine

## 2018-05-06 ENCOUNTER — Encounter: Payer: Self-pay | Admitting: Sports Medicine

## 2018-05-06 ENCOUNTER — Ambulatory Visit (INDEPENDENT_AMBULATORY_CARE_PROVIDER_SITE_OTHER): Payer: 59

## 2018-05-06 VITALS — BP 160/98 | HR 92 | Ht 64.0 in | Wt 215.0 lb

## 2018-05-06 DIAGNOSIS — M25559 Pain in unspecified hip: Secondary | ICD-10-CM | POA: Diagnosis not present

## 2018-05-06 DIAGNOSIS — R269 Unspecified abnormalities of gait and mobility: Secondary | ICD-10-CM | POA: Diagnosis not present

## 2018-05-06 DIAGNOSIS — M25551 Pain in right hip: Secondary | ICD-10-CM | POA: Diagnosis not present

## 2018-05-06 DIAGNOSIS — R262 Difficulty in walking, not elsewhere classified: Secondary | ICD-10-CM | POA: Diagnosis not present

## 2018-05-06 DIAGNOSIS — R29898 Other symptoms and signs involving the musculoskeletal system: Secondary | ICD-10-CM

## 2018-05-06 DIAGNOSIS — M4726 Other spondylosis with radiculopathy, lumbar region: Secondary | ICD-10-CM | POA: Diagnosis not present

## 2018-05-06 NOTE — Patient Instructions (Addendum)
Call [facility] at Phone: [phone] to schedule your appointment.

## 2018-05-06 NOTE — Progress Notes (Signed)
Rebecca Wagner. Rebecca Wagner Sports [facility] at [facility] [phone]  Rebecca Wagner - 57 y.o. female MRN [id number]  Date of birth: 1960-12-19  Visit Date: 05/06/2018  PCP: Rebecca Able, MD   Referred by: Rebecca Able, MD  Scribe(s) for today's visit: [name], CMA  SUBJECTIVE:  Rebecca Wagner is here for Follow-up (R hip pain)   HPI: 09/182019: Her R hip pain symptoms INITIALLY: Began about 2 weeks ago and began when bending over the clean her tub. She stool to walk to the bedroom and felt a sharp pain down her R leg.  Described as moderate sharp pain, radiating to the leg but not beyond the ankle.  Worsened with walking, sitting and extending her R leg, sit-to-stand.  Improved with heat.  Additional associated symptoms include: Hx of LBP, L hip pain (2008). She denies current LBP. She reports recent weight gain d/t sedentary job and snack choices. She denies loss of control of bladder or bowel function.    At this time symptoms are improving compared to onset.  She has been taking Gabapentin 400 mg at bedtime per PCP with some relief. Last week she took the Gabapentin BID, it made her feel sleepy. Prior to taking Gabapentin she had tried Tylenol arthritis with no relief. She can't take IBU, post heart attack and stroke per pt. Since the pain started she has been ambulating with a cane, this helps her support herself.   04/17/2018: Compared to the last office visit on 03/08/18, her previously described R hip symptoms are improving and she notes 85-90% improvement.  Pt states that she would like to request some exercises for her arms due to chest and UE tightness and discomfort, a refill on her Flexeril and an rx for Voltaren gel.  She states that she also would like to get an rx for PT for her prior stroke and heart attack. Current symptoms are mild & are radiating to R LE She has been taking Flexeril 5mg  nightly and has not been taking  Gabapentin.  She has been doing [employer] and her HEP. CT abd/pelv 01/2016; L hip XR - 10/06/2006; L hip MRI - 10/25/2006  05/06/2018: Compared to the last office visit, her previously described symptoms are worsening. She went to a football game Saturday and sat on the cold hard bleachers. Pain was in the R side of the lower back but that has resolved. Now the pain radiates into the R hip and down the leg to the calf. Sx are worse when putting pressure on the R leg.  Current symptoms are severe & are radiating to the R leg.  She has been using Voltaren gel prn and taking Flexeril at bedtime, which helps some. She was provided with Derrill Kay exercises and HEP (pelvic tilt, cat/camal, and hip aBduction) which she was doing qhs, she has stopped since pain flared up Saturday. She was referred to PT but she hasn't been in contact with PT to schedule. She has been using pain patch similar to [employer]. She has been taking Gabapentin BID-TID prn. She has also been taking Tylenol prn. She cannot take IBU. She has been ambulating with a walker.    REVIEW OF SYSTEMS: Reports night time disturbances. Denies fevers, chills, or night sweats. Reports unexplained weight loss, down 8 lbs since her last visit. . Denies personal history of cancer. Reports changes in bowel or bladder habits - nocturia, diabetic. Denies recent unreported falls. Denies new or worsening  dyspnea or wheezing. Denies headaches or dizziness.  Reports numbness, tingling or weakness RLE.  Denies dizziness or presyncopal episodes Denies lower extremity edema.   HISTORY:  Prior history reviewed and updated per electronic medical record.  Social History   Occupational History  . Not on file  Tobacco Use  . Smoking status: Former Games developer  . Smokeless tobacco: Never Used  . Tobacco comment: quit smoking in 2010  Substance and Sexual Activity  . Alcohol use: No  . Drug use: No  . Sexual activity: Yes    Birth control/protection:  Post-menopausal   Social History   Social History Narrative  . Not on file    Past Medical History:  Diagnosis Date  . Arthritis   . Depression   . Diabetes mellitus without complication (HCC)   . Hypertension    Past Surgical History:  Procedure Laterality Date  . CARDIAC CATHETERIZATION N/A 04/10/2016   Procedure: Left Heart Cath and Coronary Angiography;  Surgeon: Iran Ouch, MD;  [facility]: [facility];  Service: Cardiovascular;  Laterality: N/A;  . CESAREAN SECTION    . LAPAROSCOPIC APPENDECTOMY N/A 02/29/2016   Procedure: LAPAROSCOPIC APPENDECTOMY;  Surgeon: Axel Filler, MD;  Location: [facility] OR;  Service: General;  Laterality: N/A;   family history includes Diabetes in her mother; Heart disease in her father.  DATA OBTAINED & REVIEWED:   No results for input(s): HGBA1C, LABURIC, CREATINE in the last 8760 hours. .   OBJECTIVE:  VS:  HT:5\' 4"  (162.6 cm)   WT:215 lb (97.5 kg)  BMI:36.89    BP:(!) 160/98  HR:92bpm  TEMP: ( )  RESP:98 %   PHYSICAL EXAM: CONSTITUTIONAL: Well-developed, Well-nourished and In no acute distress PSYCHIATRIC: Alert & appropriately interactive. and Not depressed or anxious appearing. RESPIRATORY: No increased work of breathing and Trachea Midline EYES: Pupils are equal., EOM intact without nystagmus. and No scleral icterus.  VASCULAR EXAM: Warm and well perfused NEURO: unremarkable  MSK Exam: Back and hips:  Well aligned, no significant deformity. No overlying skin changes. Prior tenderness over bilateral hips is significantly improved.  She has good internal and external range of motion bilateral hips.  Negative straight leg raise.   SPECIALITY TESTING:  Hips have good internal and external range of motion with only pain over the gluteal musculature.  She has a negative FADIR and Pearlean Brownie test although slightly painful at terminal FADIR on the left not right.  Wagner to heel toe walk     ASSESSMENT   1. Right hip pain     2. Difficulty walking   3. Greater trochanteric pain syndrome   4. Weakness of both hips   5. Gait disturbance      PROCEDURES:  None  PLAN:  Pertinent additional documentation may be included in corresponding procedure notes, imaging studies, problem based documentation and patient instructions.  Greater trochanteric pain syndrome Hip x-rays are reassuring today however she likely has a significant component of greater trochanteric pain syndrome and significant weakness with hip abductor's.  She will benefit from formal physical therapy   Referral to physical therapy placed today   Activity modifications and the importance of avoiding exacerbating activities (limiting pain to no more than a 4 / 10 during or following activity) recommended and discussed.  Discussed red flag symptoms that warrant earlier emergent evaluation and patient voices understanding.   No orders of the defined types were placed in this encounter.  Lab Orders  No laboratory test(s) ordered today   Imaging  Orders     DG Lumbar Spine Complete     DG HIP UNILAT W OR W/O PELVIS 2-3 VIEWS RIGHT Referral Orders  No referral(s) requested today   Return in about 6 weeks (around 06/17/2018).     CMA/ATC served as Neurosurgeon during this visit. History, Physical, and Plan performed by medical provider. Documentation and orders reviewed and attested to.      Andrena Mews, DO     Sports Medicine Physician

## 2018-05-08 MED FILL — cloNIDine HCL 0.2 MG TABS: 0.2 | 30 days supply | Qty: 60 | Fill #2

## 2018-05-11 ENCOUNTER — Telehealth: Payer: Self-pay

## 2018-05-11 NOTE — Telephone Encounter (Signed)
FMLA forms received and to Dr. Berline Chough to complete/review.

## 2018-05-12 DIAGNOSIS — Z0279 Encounter for issue of other medical certificate: Secondary | ICD-10-CM

## 2018-05-12 NOTE — Telephone Encounter (Signed)
Completed and returned to Brandy 

## 2018-05-13 ENCOUNTER — Telehealth: Payer: Self-pay | Admitting: Sports Medicine

## 2018-05-13 NOTE — Telephone Encounter (Signed)
Pt requesting to have a prescription for Pensaid 2% instead of Diclofenac sodium topical gel 1%.Pt requesting Pensaid 2% because it is a stronger gel to help with pain. Pt states if she can not have a prescription for Pensaid 2% she would like to have a refill of Diclofenac sodium topical gel. Pt would like for the prescription to be sent to [facility].

## 2018-05-13 NOTE — Telephone Encounter (Signed)
Forms ready for pick up and ready to be faxed. Called pt and left VM to call the office. There will be at $29.00 simple FMLA form fee associated with these forms.

## 2018-05-13 NOTE — Telephone Encounter (Signed)
See note

## 2018-05-13 NOTE — Telephone Encounter (Signed)
Dr. Berline Choughigby, please advise of OK to change change Rx.

## 2018-05-14 NOTE — Telephone Encounter (Signed)
Called pt and left VM to call the office.  

## 2018-05-15 ENCOUNTER — Other Ambulatory Visit: Payer: Self-pay

## 2018-05-15 ENCOUNTER — Ambulatory Visit: Payer: 59 | Attending: Nurse Practitioner | Admitting: Physical Therapy

## 2018-05-15 ENCOUNTER — Encounter: Payer: Self-pay | Admitting: Physical Therapy

## 2018-05-15 ENCOUNTER — Other Ambulatory Visit: Payer: Self-pay | Admitting: Sports Medicine

## 2018-05-15 DIAGNOSIS — M6281 Muscle weakness (generalized): Secondary | ICD-10-CM | POA: Diagnosis not present

## 2018-05-15 DIAGNOSIS — M5441 Lumbago with sciatica, right side: Secondary | ICD-10-CM | POA: Insufficient documentation

## 2018-05-15 DIAGNOSIS — R2681 Unsteadiness on feet: Secondary | ICD-10-CM | POA: Insufficient documentation

## 2018-05-15 DIAGNOSIS — R2689 Other abnormalities of gait and mobility: Secondary | ICD-10-CM | POA: Insufficient documentation

## 2018-05-15 DIAGNOSIS — G8929 Other chronic pain: Secondary | ICD-10-CM | POA: Insufficient documentation

## 2018-05-15 MED FILL — CYCLOBENZAPRINE 10 MG TAB: 10 | 90 days supply | Qty: 90 | Fill #0

## 2018-05-15 MED FILL — GABAPENTIN 400 MG CAPSULE: 400 | 30 days supply | Qty: 90 | Fill #2

## 2018-05-15 NOTE — Telephone Encounter (Signed)
Last OV 05/06/2018 Last refill 04/17/2018 #90/3 Next OV 06/26/2018  Looks like pt should have refills remaining. Will call pharmacy to see if rx was placed "on file".

## 2018-05-15 NOTE — Therapy (Signed)
[facility] [address] [place], [place], [address] Phone: [phone]   Fax:  [phone]  Physical Therapy Evaluation  Patient Details  Name: Rebecca Wagner MRN: [id number] Date of Birth: [birth year] Referring Provider (PT): Dr. Berline Choughigby   Encounter Date: 05/15/2018  PT End of Session - 05/15/18 1456    Visit Number  1    Number of Visits  13    Date for PT Re-Evaluation  07/03/18    Authorization Type  [facility] UMR    PT Start Time  1450    PT Stop Time  1530    PT Time Calculation (min)  40 min    Activity Tolerance  Patient tolerated treatment well    Behavior During Therapy  [facility] for tasks assessed/performed       Past Medical History:  Diagnosis Date  . Arthritis   . Depression   . Diabetes mellitus without complication (HCC)   . Hypertension     Past Surgical History:  Procedure Laterality Date  . CARDIAC CATHETERIZATION N/A 04/10/2016   Procedure: Left Heart Cath and Coronary Angiography;  Surgeon: Iran OuchMuhammad A Arida, MD;  Location: [facility] INVAS[facility] LAB;  Service: Cardiovascular;  Laterality: N/A;  . CESAREAN SECTION    . LAPAROSCOPIC APPENDECTOMY N/A 02/29/2016   Procedure: LAPAROSCOPIC APPENDECTOMY;  Surgeon: Axel FillerArmando Ramirez, MD;  [facility];  Service: General;  Laterality: N/A;    There were no vitals filed for this visit.   Subjective Assessment - 05/15/18 1453    Subjective  Patient reports CVA in 2017 - never followed up on PT referral. Feels like she doesn't walk very well. Feels like she wobbles when she walks, has reduced gait speed. Walks with a SPC intermittently - "trying to divorce cane" - has to use when pain flares up. Has had to miss work due to pain at low back. Pain shoots from back into leg. Has been taking bird baths as she is afraid to step into tub for shower. Feels as if pain is exacerbated by hot showers and cold weather. Denies falls, but will stumble; often uses furniture/walls  to walk.     Pertinent History  CVA, HTN, DM, arthritis    Patient Stated Goals  "to be able to walk more efficiently, and not be scared to walk"    Currently in Pain?  No/denies         [facility] Assessment - 05/15/18 1501      Assessment   Medical Diagnosis  Difficulty walking    Referring Provider (PT)  Dr. Berline Choughigby    Onset Date/Surgical Date  --   CVA in 2017   Prior Therapy  no      Precautions   Precautions  Fall    Precaution Comments  prefers female therapist      Restrictions   Weight Bearing Restrictions  No      Balance Screen   Has the patient fallen in the past 6 months  No    Has the patient had a decrease in activity level because of a fear of falling?   No    Is the patient reluctant to leave their home because of a fear of falling?   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    Leisure  singing in choir      Cognition   Overall Cognitive Status  Within Functional Limits for tasks assessed      Sensation   Light Touch  Appears Intact      Coordination   Gross Motor Movements are Fluid and Coordinated  Yes      Posture/Postural Control   Posture/Postural Control  Postural limitations    Postural Limitations  Rounded Shoulders;Forward head;Posterior pelvic tilt      Ambulation/Gait   Ambulation/Gait  Yes    Ambulation/Gait Assistance  6: Modified independent (Device/Increase time)    Ambulation Distance (Feet)  100 Feet    Assistive device  None    Gait Pattern  Step-through  pattern;Decreased stride length;Antalgic;Trunk flexed    Ambulation Surface  Level;Indoor    Gait velocity  2.38 ft/sec    Stairs  Yes    Stairs Assistance  6: Modified independent (Device/Increase time)    Stair Management Technique  Two rails;Alternating pattern;Forwards    Number of Stairs  4    Height of Stairs  6      Balance   Balance Assessed  Yes      Standardized Balance Assessment   Standardized Balance Assessment  Berg Balance Test;Five Times Sit to Stand    Five times sit to stand comments   21.63      Berg Balance Test   Sit to Stand  Able to stand  independently using hands    Standing Unsupported  Able to stand safely 2 minutes    Sitting with Back Unsupported but Feet Supported on Floor or Stool  Able to sit safely and securely 2 minutes    Stand to Sit  Controls descent by using hands    Transfers  Able to transfer safely, definite need of hands    Standing Unsupported with Eyes Closed  Able to stand 10 seconds with supervision    Standing Ubsupported with Feet Together  Able to place feet together independently and stand for 1 minute with supervision    From Standing, Reach Forward with Outstretched Arm  Can reach forward >12 cm safely (5")    From Standing Position, Pick up Object from Floor  Able to pick up shoe, needs supervision    From Standing Position, Turn to Look Behind Over each Shoulder  Looks behind from both sides and weight shifts well    Turn 360 Degrees  Able to turn 360 degrees safely but slowly    Standing Unsupported, Alternately Place Feet on Step/Stool  Able to complete >2 steps/needs minimal assist    Standing Unsupported, One Foot in Front  Able to take small step independently and hold 30 seconds    Standing on One Leg  Tries to lift leg/unable to hold 3 seconds but remains standing independently    Total Score  39    Berg comment:  high fall risk                Objective measurements completed on examination: See above findings.  PT Education - 05/15/18 1647    Education Details  exam findings, POC, goal establishment    Person(s) Educated  Patient    Methods  Explanation    Comprehension  Verbalized understanding          PT Long Term Goals - 05/15/18 1651      PT LONG TERM GOAL #1   Title  patient to be independent with advanced HEP     Time  6    Period  Weeks    Status  New    Target Date  07/03/18      PT LONG TERM GOAL #2   Title  patient to improve gait speed to >/= 3.2 ft/sec with LRAD for improved functional mobility    Time  6    Period  Weeks    Status  New    Target Date  07/03/18      PT LONG TERM GOAL #3   Title  patient to improve 5x sit to stand by >/= 8 seconds demonstrating improved mobility    Time  6    Period  Weeks    Status  New    Target Date  07/03/18      PT LONG TERM GOAL #4   Title  patient to improve Berg by >/= 6 points demosntrating reduced fall risk    Time  6    Period  Weeks    Status  New    Target Date  07/03/18      PT LONG TERM GOAL #5   Title  patient to demonstrate reciprocal stair navigation up 1 flight with single handrail without LOB or instability    Time  6    Period  Weeks    Status  New    Target Date  07/03/18             Plan - 05/15/18 1457    Clinical Impression Statement  Rebecca Wagner is a very pleasant 57 y/o female presenting to OPPT today regarding primary complaints of reduced balance and mobility as well as low back pain with radiation into LE. Patient today with reduced gait speed compared to that of a community ambulator as well as demonstrating high fall risk with Berg Balance and 5x sit to stand. Patient to benefit from skilled PT intervention to address functional mobility limitations and balance to reduce fall risk.     History and Personal Factors relevant to plan of care:  CVA, HTN, DM, arthritis    Clinical Presentation  Evolving    Clinical Presentation due to:  CVA, HTN, DM, arthritis, low  back pain, use of AD    Clinical Decision Making  Moderate    Rehab Potential  Good    PT Frequency  2x / week    PT Duration  6 weeks    PT Treatment/Interventions  ADLs/Self Care Home Management;Cryotherapy;Moist Heat;Therapeutic exercise;Therapeutic activities;Functional mobility training;Stair training;Gait training;DME Instruction;Balance training;Neuromuscular re-education;Patient/family education;Manual techniques    PT Next Visit Plan  initiate HEP for balance and strength    Consulted and Agree with Plan of Care  Patient       Patient will benefit from skilled therapeutic intervention in order to improve the following deficits and impairments:  Abnormal gait, Decreased activity tolerance, Decreased balance, Decreased safety awareness, Decreased mobility, Difficulty walking, Decreased strength  Visit Diagnosis: Unsteadiness on feet  Other abnormalities of gait and mobility  Muscle weakness (generalized)  Chronic bilateral low back pain with right-sided  sciatica     Problem List Patient Active Problem List   Diagnosis Date Noted  . Nondisplaced fracture of navicular bone of left foot with routine healing 12/21/2016  . Closed displaced fracture of navicular bone of left foot 12/12/2016  . Essential hypertension, benign 05/06/2016  . Left Frontal-Parietal CVA 04/11/2016  . ST elevation - in setting of CVA & HTN Emergency - NOT MI 04/11/2016  . Multiple vessel coronary artery disease 04/11/2016  . Diabetes mellitus type 2, insulin dependent (HCC) 04/11/2016  . Acute respiratory failure with hypoxia (HCC) 04/11/2016  . Hyperlipidemia 04/11/2016  . Acute pulmonary edema (HCC)   . Hypertensive emergency   . Acute appendicitis 02/29/2016    Kipp Laurence, PT, DPT Supplemental Physical Therapist 05/15/18 4:54 PM Pager: [phone] Office: [phone]  [facility] [address] [place], [place],  [address] Phone: [phone]   Fax:  [phone]  Name: Rebecca Wagner MRN: [id number] Date of Birth: 03/18/1961

## 2018-05-18 NOTE — Telephone Encounter (Signed)
I believe this has been changed.  Okay to change to Pennsaid but may need to have this filled through [facility].

## 2018-05-18 NOTE — Telephone Encounter (Signed)
[facility].

## 2018-05-19 ENCOUNTER — Other Ambulatory Visit: Payer: Self-pay | Admitting: Physical Therapy

## 2018-05-19 NOTE — Telephone Encounter (Signed)
Called and LM for pt about which pharmacy she would prefer us to send the Pennsaid script to due to the high co-pay w/ non[facility] pharmacy.  Will not proceed w/ placing order until I hear from the pt.

## 2018-05-20 NOTE — Telephone Encounter (Signed)
Called again and left another message regarding needing to hear from pt r.e. Pharmacy for Pennsaid before I will place rx.

## 2018-05-21 ENCOUNTER — Other Ambulatory Visit: Payer: Self-pay | Admitting: Physical Therapy

## 2018-05-21 MED ORDER — DICLOFENAC SODIUM 2 % TD SOLN: 1.0000 "application " | Freq: Two times a day (BID) | TRANSDERMAL | 2 refills | Status: DC

## 2018-05-21 MED FILL — CARVEDILOL 25 MG TABLET: 25 | 90 days supply | Qty: 180 | Fill #0

## 2018-05-21 NOTE — Telephone Encounter (Signed)
Pt returned my call but not state whether or not is was ok for me to place her rx for Pennsaid w/ [facility] vs her normal pharmacy but did state that she needs the medication.  [facility].

## 2018-05-25 NOTE — Telephone Encounter (Signed)
Fax received 05/18/2018:  Please complete the medication clarification and fax back to [phone] by 06/02/2018.

## 2018-05-26 NOTE — Telephone Encounter (Signed)
Forms to Dr. Berline Choughigby to review/complete.

## 2018-06-04 ENCOUNTER — Telehealth: Payer: Self-pay | Admitting: Family Medicine

## 2018-06-04 NOTE — Telephone Encounter (Signed)
Copied from CRM [id number]. Topic: Quick Communication - See Telephone Encounter >> Jun 04, 2018 12:55 PM Floria RavelingStovall, Shana A wrote: CRM for notification. See Telephone encounter for: 06/04/18.  Pt stated she does not start PT until the 13th.  She is still hurting and is currently having really serve pain in her leg and hip.  She is waiting to hear back from her cardiologist to see if she can use the pensaid due to the ibufrofen  Best call back number is [phone]

## 2018-06-08 NOTE — Telephone Encounter (Signed)
Called pt and left VM to call the office.  

## 2018-06-12 ENCOUNTER — Encounter: Payer: Self-pay | Admitting: Physical Therapy

## 2018-06-12 ENCOUNTER — Ambulatory Visit: Payer: 59 | Attending: Nurse Practitioner | Admitting: Physical Therapy

## 2018-06-12 DIAGNOSIS — G8929 Other chronic pain: Secondary | ICD-10-CM | POA: Diagnosis not present

## 2018-06-12 DIAGNOSIS — R2681 Unsteadiness on feet: Secondary | ICD-10-CM | POA: Diagnosis not present

## 2018-06-12 DIAGNOSIS — M25551 Pain in right hip: Secondary | ICD-10-CM | POA: Insufficient documentation

## 2018-06-12 DIAGNOSIS — M5441 Lumbago with sciatica, right side: Secondary | ICD-10-CM | POA: Insufficient documentation

## 2018-06-12 DIAGNOSIS — R2689 Other abnormalities of gait and mobility: Secondary | ICD-10-CM | POA: Insufficient documentation

## 2018-06-12 DIAGNOSIS — M6281 Muscle weakness (generalized): Secondary | ICD-10-CM | POA: Insufficient documentation

## 2018-06-12 NOTE — Patient Instructions (Signed)
Access Code: [email or web address]  URL: [email or web address]  Date: 06/12/2018  Prepared by: Modena Morrowenise Robertson   Exercises  Sit to Stand without Arm Support - 10 reps - 3 sets - 2x daily - 7x weekly  Standing Tandem Balance with Counter Support - 2 sets - 10 hold - 1x daily - 7x weekly  Single Leg Stance with Support - 3 sets - 10 hold - 1x daily - 7x weekly  Standing Hip Abduction with Counter Support - 10 reps - 2 sets - 1x daily - 7x weekly  Walking with Counter Support - 5 reps - 1x daily - 7x weekly  Backward Walking with Counter Support - 5 reps - 1x daily - 7x weekly  Standing March with Counter Support - 10 reps - 2 sets - 1x daily - 7x weekly

## 2018-06-12 NOTE — Therapy (Signed)
[facility] [address] [place], [place], [address] Phone: [phone]   Fax:  [phone]  Physical Therapy Treatment  Patient Details  Name: Rebecca Wagner MRN: [id number] Date of Birth: 1961/05/12 Referring Provider (PT): Dr. Berline Choughigby   Encounter Date: 06/12/2018  PT End of Session - 06/12/18 1635    Visit Number  2    Number of Visits  13    Date for PT Re-Evaluation  07/03/18    Authorization Type  [facility] U[facility]    PT Start Time  1438    PT Stop Time  1520    PT Time Calculation (min)  42 min    Activity Tolerance  Patient tolerated treatment well    Behavior During Therapy  [facility] for tasks assessed/performed       Past Medical History:  Diagnosis Date  . Arthritis   . Depression   . Diabetes mellitus without complication (HCC)   . Hypertension     Past Surgical History:  Procedure Laterality Date  . CARDIAC CATHETERIZATION N/A 04/10/2016   Procedure: Left Heart Cath and Coronary Angiography;  Surgeon: Iran OuchMuhammad A Arida, MD;  [facility];  Service: Cardiovascular;  Laterality: N/A;  . CESAREAN SECTION    . LAPAROSCOPIC APPENDECTOMY N/A 02/29/2016   Procedure: LAPAROSCOPIC APPENDECTOMY;  Surgeon: Axel FillerArmando Ramirez, MD;  [facility];  Service: General;  Laterality: N/A;    There were no vitals filed for this visit.  Subjective Assessment - 06/12/18 1540    Subjective  Pt reports still having pain in R hip that radiates down her leg.  Has called Dr Janeece Riggersigby's office and has a follow up with him.  Denies falls.    Pertinent History  CVA, HTN, DM, arthritis    Patient Stated Goals  "to be able to walk more efficiently, and not be scared to walk"    Currently in Pain?  No/denies                       [facility]/Exercise - 06/12/18 0001      Exercises   Exercises  Knee/Hip      Knee/Hip Exercises: Aerobic   Nustep  9 minutes no resistance for flexibility       Performed HEP as provided in Pt Instructions during session.  Pt requiring intermittent UE assist.         PT Education - 06/12/18 1635    Education Details  HEP, initiating walking program 1x/day    Person(s) Educated  Patient    Methods  Explanation;Demonstration;Handout    Comprehension  Verbalized understanding;Returned demonstration;Need further instruction          PT Long Term Goals - 05/15/18 1651      PT LONG TERM GOAL #1   Title  patient to be independent with advanced HEP     Time  6    Period  Weeks    Status  New    Target Date  07/03/18      PT LONG TERM GOAL #2   Title  patient to improve gait speed to >/= 3.2 ft/sec with LRAD for improved functional mobility    Time  6    Period  Weeks    Status  New    Target Date  07/03/18      PT LONG TERM GOAL #3   Title  patient to improve 5x sit to stand by >/= 8 seconds  demonstrating improved mobility    Time  6    Period  Weeks    Status  New    Target Date  07/03/18      PT LONG TERM GOAL #4   Title  patient to improve Berg by >/= 6 points demosntrating reduced fall risk    Time  6    Period  Weeks    Status  New    Target Date  07/03/18      PT LONG TERM GOAL #5   Title  patient to demonstrate reciprocal stair navigation up 1 flight with single handrail without LOB or instability    Time  6    Period  Weeks    Status  New    Target Date  07/03/18            Plan - 06/12/18 1636    Clinical Impression Statement  Session focused on developing HEP for patient to address strength and balance.  Pt continues with R LE pain at times.  Reported 0/10 upon arrival and "little" pain with therapy session.  Discussed taking her tylenol prior to treatment.  Continue PT per POC.    Rehab Potential  Good    PT Frequency  2x / week    PT Duration  6 weeks    PT Treatment/Interventions  ADLs/Self Care Home Management;Cryotherapy;Moist Heat;Therapeutic exercise;Therapeutic activities;Functional mobility  training;Stair training;Gait training;DME Instruction;Balance training;Neuromuscular re-education;Patient/family education;Manual techniques    PT Next Visit Plan  Review HEP and revise if needed.  Follow up on walking since last visit.  Develop walking program.  Continue strength and balance.    PT Home Exercise Plan   Access Code: [email or web address]     Consulted and Agree with Plan of Care  Patient       Patient will benefit from skilled therapeutic intervention in order to improve the following deficits and impairments:  Abnormal gait, Decreased activity tolerance, Decreased balance, Decreased safety awareness, Decreased mobility, Difficulty walking, Decreased strength  Visit Diagnosis: Unsteadiness on feet  Other abnormalities of gait and mobility  Muscle weakness (generalized)  Chronic bilateral low back pain with right-sided sciatica     Problem List Patient Active Problem List   Diagnosis Date Noted  . Nondisplaced fracture of navicular bone of left foot with routine healing 12/21/2016  . Closed displaced fracture of navicular bone of left foot 12/12/2016  . Essential hypertension, benign 05/06/2016  . Left Frontal-Parietal CVA 04/11/2016  . ST elevation - in setting of CVA & HTN Emergency - NOT MI 04/11/2016  . Multiple vessel coronary artery disease 04/11/2016  . Diabetes mellitus type 2, insulin dependent (HCC) 04/11/2016  . Acute respiratory failure with hypoxia (HCC) 04/11/2016  . Hyperlipidemia 04/11/2016  . Acute pulmonary edema (HCC)   . Hypertensive emergency   . Acute appendicitis 02/29/2016    Newell Coral, PTA [facility] 06/12/18 4:40 PM Phone: [phone] Fax: [phone]   [facility] [address] [place], [place], [address] Phone: [phone]   Fax:  [phone]  Name: Rebecca Wagner MRN: [id number] Date of Birth: May 20, 1961

## 2018-06-17 ENCOUNTER — Ambulatory Visit: Payer: 59 | Admitting: Physical Therapy

## 2018-06-17 VITALS — BP 150/90 | HR 72

## 2018-06-17 DIAGNOSIS — M5441 Lumbago with sciatica, right side: Secondary | ICD-10-CM

## 2018-06-17 DIAGNOSIS — R2681 Unsteadiness on feet: Secondary | ICD-10-CM

## 2018-06-17 DIAGNOSIS — R2689 Other abnormalities of gait and mobility: Secondary | ICD-10-CM | POA: Diagnosis not present

## 2018-06-17 DIAGNOSIS — G8929 Other chronic pain: Secondary | ICD-10-CM

## 2018-06-17 DIAGNOSIS — M25551 Pain in right hip: Secondary | ICD-10-CM

## 2018-06-17 DIAGNOSIS — M6281 Muscle weakness (generalized): Secondary | ICD-10-CM | POA: Diagnosis not present

## 2018-06-17 NOTE — Therapy (Signed)
[facility] [address] [place], [place], [address] Phone: [phone]   Fax:  [phone]  Physical Therapy Treatment  Patient Details  Name: Rebecca Wagner MRN: [id number] Date of Birth: 10/07/1960 Referring Provider (PT): Dr. Berline Chough   Encounter Date: 06/17/2018  PT End of Session - 06/17/18 1701    Visit Number  3    Number of Visits  13    Date for PT Re-Evaluation  07/03/18    Authorization Type  [facility]    PT Start Time  1600    PT Stop Time  1630    PT Time Calculation (min)  30 min    Activity Tolerance  Patient tolerated treatment well;Patient limited by pain;Patient limited by fatigue    Behavior During Therapy  [facility] for tasks assessed/performed       Past Medical History:  Diagnosis Date  . Arthritis   . Depression   . Diabetes mellitus without complication (HCC)   . Hypertension     Past Surgical History:  Procedure Laterality Date  . CARDIAC CATHETERIZATION N/A 04/10/2016   Procedure: Left Heart Cath and Coronary Angiography;  Surgeon: Iran Ouch, MD;  [facility];  Service: Cardiovascular;  Laterality: N/A;  . CESAREAN SECTION    . LAPAROSCOPIC APPENDECTOMY N/A 02/29/2016   Procedure: LAPAROSCOPIC APPENDECTOMY;  Surgeon: Axel Filler, MD;  Location: [facility];  Service: General;  Laterality: N/A;    Vitals:   06/17/18 1600 06/17/18 1625  BP: (!) 180/100 (!) 150/90  Pulse: 80 72    Subjective Assessment - 06/17/18 1652    Subjective  Pt reported 7/10 right lateral upper leg with radiating pain down to knee; she came in 15 min late and was hurrying, observed her being distressed and out of breath but she was walking and talking , but focsed on the leg pain;  pt did report during the session that she sits for work, has not been doing therapy since her CVA, 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  Yes    Pain Score  7     Pain Location  Hip    Pain Orientation  Right    Pain Descriptors / Indicators  Sharp;Burning;Tender;Radiating    Pain Type  Acute pain    Pain Radiating Towards  knee    Pain Onset  1 to 4 weeks ago    Pain Frequency  Intermittent    Aggravating Factors   sitting, walking transitional mvts     Pain Relieving Factors  take tylenol     Effect of Pain on Daily Activities  makes walking very difficult     Multiple Pain Sites  No                       [facility] Adult PT Treatment/Exercise - 06/17/18 0001      Ambulation/Gait   Ambulation/Gait  Yes    Ambulation/Gait Assistance  6: Modified independent (Device/Increase time)    Ambulation Distance (Feet)  100 Feet    Assistive device  Small based quad cane   recommended to move the left hand vs right due to hip pain R   Gait Pattern  Step-to pattern;Decreased weight shift to left;Decreased stride  length;Shuffle;Trendelenburg;Lateral trunk lean to right;Lateral hip instability;Poor foot clearance - left;Wide base of support    Ambulation Surface  Level      Manual Therapy   Manual Therapy  Soft tissue mobilization;Manual Traction    Soft tissue mobilization  to right IT band; foam roll    Manual Traction  long axis traction right LE             PT Education - 06/17/18 1659    Education Details  Change to holding cane in the left hand to normalize gait and take excessive stress of the right IT band; stressed importance of her getting BP under contorl; she is a Char[employer] fundraiserN and knows the importance ;    Person(s) Educated  Patient    Methods  Explanation;Demonstration    Comprehension  Verbalized understanding;Need further instruction          PT Long Term Goals - 05/15/18 1651      PT LONG TERM GOAL #1   Title  patient to be independent with advanced HEP     Time  6    Period  Weeks    Status  New     Target Date  07/03/18      PT LONG TERM GOAL #2   Title  patient to improve gait speed to >/= 3.2 ft/sec with LRAD for improved functional mobility    Time  6    Period  Weeks    Status  New    Target Date  07/03/18      PT LONG TERM GOAL #3   Title  patient to improve 5x sit to stand by >/= 8 seconds demonstrating improved mobility    Time  6    Period  Weeks    Status  New    Target Date  07/03/18      PT LONG TERM GOAL #4   Title  patient to improve Berg by >/= 6 points demosntrating reduced fall risk    Time  6    Period  Weeks    Status  New    Target Date  07/03/18      PT LONG TERM GOAL #5   Title  patient to demonstrate reciprocal stair navigation up 1 flight with single handrail without LOB or instability    Time  6    Period  Weeks    Status  New    Target Date  07/03/18            Plan - 06/17/18 1701    Clinical Impression Statement  Pt's BP was main issue initially; held the plan to do body weight supported gait training and shifted to passive tx with relaxation tech to see if we could help get her BP to come down; able to affect the hip pain with manual techniques; she left at 4.5/10 pain , much less stressed affect and BP and come down to more normalized level; although still elevated; her hip pain is consistent with right trochanteric bursitis and it responded very well the  STM using the foam roll;  negative FABER and FADIR tests.  pain is only along the IT band     Clinical Presentation  Evolving    Clinical Decision Making  Moderate    Rehab Potential  Good    Clinical Impairments Affecting Rehab Potential  HTN    PT Treatment/Interventions  ADLs/Self Care Home Management;Cryotherapy;Moist Heat;Therapeutic exercise;Therapeutic activities;Functional mobility training;Stair training;Gait training;DME Instruction;Balance training;Neuromuscular re-education;Patient/family education;Manual techniques  PT Next Visit Plan  take BP and adapt POC as needed;  assess effectivness of manual therapy; if pain is low and bp is normal recommend body weight support to get her to walk at a pace of 3-4 ft/sec; work on  fear of walking; review HEP compliance     Consulted and Agree with Plan of Care  Patient       Patient will benefit from skilled therapeutic intervention in order to improve the following deficits and impairments:  Abnormal gait, Decreased activity tolerance, Decreased balance, Decreased safety awareness, Decreased mobility, Difficulty walking, Decreased strength  Visit Diagnosis: Unsteadiness on feet  Other abnormalities of gait and mobility  Muscle weakness (generalized)  Chronic bilateral low back pain with right-sided sciatica  Pain in right hip     Problem List Patient Active Problem List   Diagnosis Date Noted  . Nondisplaced fracture of navicular bone of left foot with routine healing 12/21/2016  . Closed displaced fracture of navicular bone of left foot 12/12/2016  . Essential hypertension, benign 05/06/2016  . Left Frontal-Parietal CVA 04/11/2016  . ST elevation - in setting of CVA & HTN Emergency - NOT MI 04/11/2016  . Multiple vessel coronary artery disease 04/11/2016  . Diabetes mellitus type 2, insulin dependent (HCC) 04/11/2016  . Acute respiratory failure with hypoxia (HCC) 04/11/2016  . Hyperlipidemia 04/11/2016  . Acute pulmonary edema (HCC)   . Hypertensive emergency   . Acute appendicitis 02/29/2016    Vashti Hey D PT DPT 06/17/2018, 5:07 PM  [facility] [address] [place], [place], [address] Phone: [phone]   Fax:  [phone]  Name: Rebecca Wagner MRN: [id number] Date of Birth: Nov 25, 1960

## 2018-06-19 ENCOUNTER — Encounter: Payer: Self-pay | Admitting: Physical Therapy

## 2018-06-19 ENCOUNTER — Ambulatory Visit: Payer: 59 | Admitting: Sports Medicine

## 2018-06-19 ENCOUNTER — Ambulatory Visit: Payer: 59 | Admitting: Physical Therapy

## 2018-06-19 VITALS — BP 164/86

## 2018-06-19 DIAGNOSIS — R2681 Unsteadiness on feet: Secondary | ICD-10-CM

## 2018-06-19 DIAGNOSIS — G8929 Other chronic pain: Secondary | ICD-10-CM | POA: Diagnosis not present

## 2018-06-19 DIAGNOSIS — R2689 Other abnormalities of gait and mobility: Secondary | ICD-10-CM

## 2018-06-19 DIAGNOSIS — M6281 Muscle weakness (generalized): Secondary | ICD-10-CM | POA: Diagnosis not present

## 2018-06-19 DIAGNOSIS — M5441 Lumbago with sciatica, right side: Secondary | ICD-10-CM | POA: Diagnosis not present

## 2018-06-19 DIAGNOSIS — M25551 Pain in right hip: Secondary | ICD-10-CM | POA: Diagnosis not present

## 2018-06-19 NOTE — Therapy (Signed)
[facility] [address] [place], [place], [address] Phone: [phone]   Fax:  [phone]  Physical Therapy Treatment  Patient Details  Name: Rebecca Wagner MRN: [id number] Date of Birth: 07-Sep-1960 Referring Provider (PT): Dr. Berline Choughigby   Encounter Date: 06/19/2018  PT End of Session - 06/19/18 1641    Visit Number  4    Number of Visits  13    Date for PT Re-Evaluation  07/03/18    Authorization Type  [facility] U[facility]    PT Start Time  1533    PT Stop Time  1615    PT Time Calculation (min)  42 min    Activity Tolerance  Patient tolerated treatment well    Behavior During Therapy  [facility] for tasks assessed/performed       Past Medical History:  Diagnosis Date  . Arthritis   . Depression   . Diabetes mellitus without complication (HCC)   . Hypertension     Past Surgical History:  Procedure Laterality Date  . CARDIAC CATHETERIZATION N/A 04/10/2016   Procedure: Left Heart Cath and Coronary Angiography;  Surgeon: Iran OuchMuhammad A Arida, MD;  [facility];  Service: Cardiovascular;  Laterality: N/A;  . CESAREAN SECTION    . LAPAROSCOPIC APPENDECTOMY N/A 02/29/2016   Procedure: LAPAROSCOPIC APPENDECTOMY;  Surgeon: Axel FillerArmando Ramirez, MD;  Location: [facility] OR;  Service: General;  Laterality: N/A;    Vitals:   06/19/18 1635  BP: (!) 164/86    Subjective Assessment - 06/19/18 1637    Subjective  feeling better at last session - greatest complaint continues to be R hip pain    Pertinent History  CVA, HTN, DM, arthritis    Limitations  Walking;Standing    Patient Stated Goals  deal with leg pain    Currently in Pain?  Yes    Pain Score  5     Pain Location  Hip    Pain Orientation  Right    Pain Descriptors / Indicators  Aching;Tender    Pain Type  Acute pain    Pain Onset  1 to 4 weeks ago                       [facility]/Exercise - 06/19/18 0001      Bed Mobility   Bed  Mobility  Rolling Right;Rolling Left;Right Sidelying to Sit    Rolling Right  Supervision/verbal cueing;Contact Guard/Touching assist    Rolling Left  Supervision/Verbal cueing;Contact Guard/Touching assist    Right Sidelying to Sit  Supervision/Verbal cueing;Contact Guard/Touching assist      Knee/Hip Exercises: Supine   Bridges  Strengthening;Both;10 reps      Knee/Hip Exercises: Sidelying   Clams  R LE only x 15 reps - cueing for form      Manual Therapy   Manual Therapy  Passive ROM    Manual therapy comments  patient supine    Passive ROM  manual stretching R hip: piriformis 3 x 60 sec; HS 3 x 60 sec                  PT Long Term Goals - 05/15/18 1651      PT LONG TERM GOAL #1   Title  patient to be independent with advanced HEP     Time  6    Period  Weeks    Status  New    Target Date  07/03/18  PT LONG TERM GOAL #2   Title  patient to improve gait speed to >/= 3.2 ft/sec with LRAD for improved functional mobility    Time  6    Period  Weeks    Status  New    Target Date  07/03/18      PT LONG TERM GOAL #3   Title  patient to improve 5x sit to stand by >/= 8 seconds demonstrating improved mobility    Time  6    Period  Weeks    Status  New    Target Date  07/03/18      PT LONG TERM GOAL #4   Title  patient to improve Berg by >/= 6 points demosntrating reduced fall risk    Time  6    Period  Weeks    Status  New    Target Date  07/03/18      PT LONG TERM GOAL #5   Title  patient to demonstrate reciprocal stair navigation up 1 flight with single handrail without LOB or instability    Time  6    Period  Weeks    Status  New    Target Date  07/03/18            Plan - 06/19/18 1642    Clinical Impression Statement  Session today focusing on R hip stretching and strenghtening with HEP handout given. Educaiton also on bed mobility - log rolling - for greater ease as patient reports difficulty with this and often has to use bed furniture to  complete successfully. Will continue to progress strength and balance as tolerated.     Rehab Potential  Good    Clinical Impairments Affecting Rehab Potential  HTN    PT Treatment/Interventions  ADLs/Self Care Home Management;Cryotherapy;Moist Heat;Therapeutic exercise;Therapeutic activities;Functional mobility training;Stair training;Gait training;DME Instruction;Balance training;Neuromuscular re-education;Patient/family education;Manual techniques    PT Next Visit Plan  take BP and adapt POC as needed; assess effectivness of manual therapy; if pain is low and bp is normal recommend body weight support to get her to walk at a pace of 3-4 ft/sec; work on  fear of walking; review HEP compliance     Consulted and Agree with Plan of Care  Patient       Patient will benefit from skilled therapeutic intervention in order to improve the following deficits and impairments:  Abnormal gait, Decreased activity tolerance, Decreased balance, Decreased safety awareness, Decreased mobility, Difficulty walking, Decreased strength  Visit Diagnosis: Unsteadiness on feet  Other abnormalities of gait and mobility  Muscle weakness (generalized)  Chronic bilateral low back pain with right-sided sciatica     Problem List Patient Active Problem List   Diagnosis Date Noted  . Nondisplaced fracture of navicular bone of left foot with routine healing 12/21/2016  . Closed displaced fracture of navicular bone of left foot 12/12/2016  . Essential hypertension, benign 05/06/2016  . Left Frontal-Parietal CVA 04/11/2016  . ST elevation - in setting of CVA & HTN Emergency - NOT MI 04/11/2016  . Multiple vessel coronary artery disease 04/11/2016  . Diabetes mellitus type 2, insulin dependent (HCC) 04/11/2016  . Acute respiratory failure with hypoxia (HCC) 04/11/2016  . Hyperlipidemia 04/11/2016  . Acute pulmonary edema (HCC)   . Hypertensive emergency   . Acute appendicitis 02/29/2016    Rebecca Wagner,  PT, DPT Supplemental Physical Therapist 06/19/18 4:50 PM Pager: [phone] Office: [phone]  [facility] [address] [place], [address] Phone: [phone]   Fax:  [phone]  Name: NIAJA STICKLEY MRN: [id number] Date of Birth: 12-10-1960

## 2018-06-19 NOTE — Patient Instructions (Addendum)
Piriformis (Supine)    Cross legs, right on top. Gently pull other knee toward chest until stretch is felt in buttock/hip of top leg. Hold _30___ seconds. Repeat __3__ times per set. Do __2__ sets per session.    Hamstring Step 2    Left foot relaxed, knee straight, other leg bent, foot flat. Raise straight leg further upward to maximal range. Hold _30__ seconds. Relax leg completely down. Repeat _3__ times.  Bridge    Lie back, legs bent. Inhale, pressing hips up. Keeping ribs in, lengthen lower back. Exhale, rolling down along spine from top. Repeat __15__ times. Do __2__ sessions per day.  Clam Shell 90 Degrees: Two Pillows    Lying with hips and knees bent 90, two pillows between knees and ankles. Roll top knee up. Be sure pelvis does not roll backward. Do not arch back. Do __15_ times, each leg, _2__ times per day.    Log Roll    Lying on back, bend left knee and place left arm across chest. Roll all in one movement to the right. Reverse to roll to the left. Always move as one unit.   Copyright  VHI. All rights reserved.

## 2018-06-22 MED FILL — CARTIA XT 180 MG CAPSULE SA: 180 | 90 days supply | Qty: 90 | Fill #1

## 2018-06-26 ENCOUNTER — Ambulatory Visit: Payer: 59 | Admitting: Sports Medicine

## 2018-06-26 ENCOUNTER — Encounter: Payer: Self-pay | Admitting: Sports Medicine

## 2018-06-26 VITALS — BP 150/86 | HR 79 | Ht 64.0 in | Wt 215.4 lb

## 2018-06-26 DIAGNOSIS — R29898 Other symptoms and signs involving the musculoskeletal system: Secondary | ICD-10-CM | POA: Diagnosis not present

## 2018-06-26 DIAGNOSIS — M25559 Pain in unspecified hip: Secondary | ICD-10-CM | POA: Diagnosis not present

## 2018-06-26 DIAGNOSIS — R262 Difficulty in walking, not elsewhere classified: Secondary | ICD-10-CM | POA: Diagnosis not present

## 2018-06-26 DIAGNOSIS — M25551 Pain in right hip: Secondary | ICD-10-CM

## 2018-06-26 NOTE — Progress Notes (Signed)
Rebecca FellsMichael D. Delorise Shinerigby, DO  [facility] [phone]  [name] - 57 y.o. female MRN [id number]  Date of birth: March 25, 1961  Visit Date: 06/26/2018  PCP: Leilani Ableeese, Betti, MD   Referred by: Leilani Ableeese, Betti, MD  SUBJECTIVE:   Chief Complaint  Patient presents with  . f/u LBP, B hip pain    HPI: Patient is here today for follow-up of her low back and bilateral hip pain.  She is having worsening symptoms on the right hip and physical therapy has recommended discussing injection.  She is hesitant to do this however.  She is continuing to have pain with any type of bending and twisting.  She has been using Tylenol, ibuprofen, Pennsaid, Biofreeze, gabapentin, salon pause, Flexeril, Voltaren.  Physical therapy has been helpful but she is having limitations there due to the lateral hip pain.   REVIEW OF SYSTEMS: Patient has nighttime disturbances due to laying on her hips.  She did have a reported fall from trying to squat down.  She is having tingling in the right lower extremity along the lateral thigh.   HISTORY:  Prior history reviewed and updated per electronic medical record.  Patient Active Problem List   Diagnosis Date Noted  . Greater trochanteric pain syndrome 06/26/2018    05/06/18 XR L-spine and R hip  IMPRESSION: Chronic severe L3-L4 disc and endplate degeneration. No acute osseous abnormality identified.  IMPRESSION: 1. Normal for age radiographic appearance of the right hip. 2. Calcified atherosclerosis.   . Nondisplaced fracture of navicular bone of left foot with routine healing 12/21/2016  . Closed displaced fracture of navicular bone of left foot 12/12/2016  . Essential hypertension, benign 05/06/2016  . Left Frontal-Parietal CVA 04/11/2016  . ST elevation - in setting of CVA & HTN Emergency - NOT MI 04/11/2016  . Multiple vessel coronary artery disease 04/11/2016      Prox RCA to Dist RCA lesion, 95  %stenosed.  Dist Cx lesion, 90 %stenosed.  Prox Cx lesion, 70 %stenosed.  Mid LAD to Dist LAD lesion, 99 %stenosed.  Dist LAD lesion, 99 %stenosed.  Ost 2nd Diag to 2nd Diag lesion, 90 %stenosed.  1st Diag lesion, 90 %stenosed.  Ost 3rd Mrg lesion, 99 %stenosed.  Ost 4th Mrg lesion, 70 %stenosed.  There is mild to moderate left ventricular systolic dysfunction.  LV end diastolic pressure is mildly elevated.  The left ventricular ejection fraction is 35-45% by visual estimate.   1. Severe, diffuse, heavily calcified 3 vessel disease in a typical diabetic pattern affecting branches and distal vessels. No clear culprit for unstable angina or myocardial infarction. 2. Mild to moderately decreased LV systolic function with an ejection fraction of 40% with akinesis of the apical segments. 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 However, LAD ischemia cannot be excluded given that the LAD wraps around the apex. 3. Mildly elevated left ventricular end-diastolic pressure.  Recommendations: No clear culprit for unstable angina is identified. It's possible that the patient developed flash pulmonary edema in the setting of uncontrolled hypertension with underlying three-vessel coronary artery disease. Recommend blood pressure control with nitroglycerin drip. The LVEDP was actually close to normal when her blood pressure was controlled after she was given one dose of labetalol. I'm still concerned about a possible neurologic event given her labile blood pressure. The patient has no good options for revascularization of her coronary artery disease given poor targets and diffuse disease.   . Diabetes mellitus type 2,  insulin dependent (HCC) 04/11/2016  . Acute respiratory failure with hypoxia (HCC) 04/11/2016  . Hyperlipidemia 04/11/2016  . Acute pulmonary edema (HCC)   . Hypertensive emergency   . Acute appendicitis 02/29/2016   Social History   Occupational History   . Not on file  Tobacco Use  . Smoking status: Former Games developermoker  . Smokeless tobacco: Never Used  . Tobacco comment: quit smoking in 2010  Substance and Sexual Activity  . Alcohol use: No  . Drug use: No  . Sexual activity: Yes    Birth control/protection: Post-menopausal   Social History   Social History Narrative  . Not on file    OBJECTIVE:  VS:  HT:5\' 4"  (162.6 cm)   WT:215 lb 6.4 oz (97.7 kg)  BMI:36.96    BP:(!) 150/86  HR:79bpm  TEMP: ( )  RESP:99 %   PHYSICAL EXAM: Adult female. No acute distress.  Alert and appropriate. Right HIP Exam: Well aligned, no significant deformity. No overlying skin changes. TTP over greater trochanter bilaterally.  Worse on the right than the left.  She has pain radiating down the IT band as well. Non tender over ASIS bilaterally.  Passive Flexion/Extension(Optional): normal, no pain Passive Internal & External Rotation(Optional): normal, no pain Log Roll(Optional): normal, no pain FADIR(Optional): normal, no pain positive, mild pain Stinchfield testing(Optional): positive, mild painStrength: 4/5   She walks with a markedly antalgic/coxalgia gait.   ASSESSMENT:   1. Right hip pain   2. Difficulty walking   3. Greater trochanteric pain syndrome   4. Weakness of both hips     PROCEDURES:  None  PLAN:  Pertinent additional documentation may be included in corresponding procedure notes, imaging studies, problem based documentation and patient instructions.  >50% of this 25 minute visit spent in direct patient counseling and/or coordination of care.  Discussion was focused on education regarding the in discussing the pathoetiology and anticipated clinical course of the above condition.  Ultimately we discussed the merits of injection for greater trochanteric pain syndrome and she said she would like additional time to think about this prior to proceeding with injection.  Continue working with physical therapy  Activity  modifications and the importance of avoiding exacerbating activities (limiting pain to no more than a 4 / 10 during or following activity) recommended and discussed.  Discussed red flag symptoms that warrant earlier emergent evaluation and patient voices understanding.   No orders of the defined types were placed in this encounter.  Lab Orders  No laboratory test(s) ordered today   Imaging Orders  No imaging studies ordered today   Referral Orders  No referral(s) requested today    At follow up will plan to consider: initial corticosteroid injections  Return in about 8 weeks (around 08/21/2018).  Or sooner for injection         Andrena MewsMichael D Laconda Basich, DO    [facility] Physician

## 2018-06-26 NOTE — Patient Instructions (Addendum)
Let me know if you would like to do the injection on either hip.  We also have the option to do nitroglycerin patches over the area.  It is okay to use the Pennsaid over the hip.  If you have any worsening lower leg or foot pain I would like to see you back sooner.  Keep working with therapy and performing home therapeutic exercises on a daily basis.

## 2018-07-03 MED FILL — traMADol HCL 50 MG TABS: 50 | 30 days supply | Qty: 120 | Fill #1

## 2018-07-03 MED FILL — LANTUS SOLOSTAR 100 UNITS/M: 100 | 38 days supply | Qty: 15 | Fill #2

## 2018-07-03 MED FILL — CARTIA XT 180 MG CAPSULE SA: 180 | 90 days supply | Qty: 90 | Fill #1

## 2018-07-10 ENCOUNTER — Ambulatory Visit: Payer: 59 | Attending: Nurse Practitioner | Admitting: Physical Therapy

## 2018-07-10 DIAGNOSIS — M25551 Pain in right hip: Secondary | ICD-10-CM | POA: Insufficient documentation

## 2018-07-10 DIAGNOSIS — G8929 Other chronic pain: Secondary | ICD-10-CM | POA: Insufficient documentation

## 2018-07-10 DIAGNOSIS — M25552 Pain in left hip: Secondary | ICD-10-CM | POA: Insufficient documentation

## 2018-07-10 DIAGNOSIS — M6281 Muscle weakness (generalized): Secondary | ICD-10-CM | POA: Insufficient documentation

## 2018-07-10 DIAGNOSIS — R2681 Unsteadiness on feet: Secondary | ICD-10-CM | POA: Insufficient documentation

## 2018-07-10 DIAGNOSIS — M5441 Lumbago with sciatica, right side: Secondary | ICD-10-CM | POA: Insufficient documentation

## 2018-07-10 DIAGNOSIS — R2689 Other abnormalities of gait and mobility: Secondary | ICD-10-CM | POA: Insufficient documentation

## 2018-07-13 ENCOUNTER — Ambulatory Visit: Payer: 59 | Admitting: Physical Therapy

## 2018-07-13 ENCOUNTER — Encounter: Payer: Self-pay | Admitting: Physical Therapy

## 2018-07-13 DIAGNOSIS — M25552 Pain in left hip: Secondary | ICD-10-CM

## 2018-07-13 DIAGNOSIS — G8929 Other chronic pain: Secondary | ICD-10-CM | POA: Diagnosis not present

## 2018-07-13 DIAGNOSIS — M5441 Lumbago with sciatica, right side: Secondary | ICD-10-CM | POA: Diagnosis not present

## 2018-07-13 DIAGNOSIS — R2689 Other abnormalities of gait and mobility: Secondary | ICD-10-CM | POA: Diagnosis not present

## 2018-07-13 DIAGNOSIS — M6281 Muscle weakness (generalized): Secondary | ICD-10-CM | POA: Diagnosis not present

## 2018-07-13 DIAGNOSIS — M25551 Pain in right hip: Secondary | ICD-10-CM

## 2018-07-13 DIAGNOSIS — R2681 Unsteadiness on feet: Secondary | ICD-10-CM | POA: Diagnosis not present

## 2018-07-13 NOTE — Therapy (Signed)
[facility] [address] [place], [place], [address] Phone: [phone]   Fax:  [phone]  Physical Therapy Treatment  Patient Details  Name: Rebecca Wagner MRN: [id number] Date of Birth: 18-Jul-1960 Referring Provider (PT): Dr. Paulla Fore   Encounter Date: 07/13/2018  PT End of Session - 07/13/18 1614    Visit Number  5    Number of Visits  21    Date for PT Re-Evaluation  09/11/18    Authorization Type  [facility]    PT Start Time  2111    PT Stop Time  1615    PT Time Calculation (min)  44 min    Activity Tolerance  Patient tolerated treatment well    Behavior During Therapy  [facility] for tasks assessed/performed       Past Medical History:  Diagnosis Date  . Arthritis   . Depression   . Diabetes mellitus without complication ([place])   . Hypertension     Past Surgical History:  Procedure Laterality Date  . CARDIAC CATHETERIZATION N/A 04/10/2016   Procedure: Left Heart Cath and Coronary Angiography;  Surgeon: Wellington Hampshire, MD;  Location: [facility];  Service: Cardiovascular;  Laterality: N/A;  . CESAREAN SECTION    . LAPAROSCOPIC APPENDECTOMY N/A 02/29/2016   Procedure: LAPAROSCOPIC APPENDECTOMY;  Surgeon: Ralene Ok, MD;  Location: [facility];  Service: General;  Laterality: N/A;    There were no vitals filed for this visit.  Subjective Assessment - 07/13/18 1539    Subjective  Hurting a lot today due to the weather and has not had her gabapentin.  Pain is in her L hip today.  Gets a steroid injection next week.  Had two falls after New years.  One time she was carrying too much in her UE.    Pertinent History  CVA, HTN, DM, arthritis    Limitations  Walking;Standing    Patient Stated Goals  deal with leg pain    Currently in Pain?  Yes    Pain Score  5     Pain Location  Hip    Pain Orientation  Left    Pain Descriptors / Indicators  Discomfort    Pain Type  Chronic pain         [facility] PT  Assessment - 07/13/18 1544      Assessment   Medical Diagnosis  Difficulty walking    Referring Provider (PT)  Dr. Paulla Fore    Onset Date/Surgical Date  04/17/18    Hand Dominance  Right    Prior Therapy  no      Precautions   Precautions  Fall    Precaution Comments  prefers female therapist      Balance Screen   Has the patient fallen in the past 6 months  Yes    How many times?  2      Prior Function   Level of Independence  Independent      Observation/Other Assessments   Focus on Therapeutic Outcomes (FOTO)   not assessed      Ambulation/Gait   Ambulation/Gait  Yes    Ambulation/Gait Assistance  6: Modified independent (Device/Increase time)    Ambulation Distance (Feet)  115 Feet    Assistive device  Small based quad cane    Gait Pattern  Step-to pattern;Decreased stride length;Decreased stance time - left;Decreased weight shift to left;Left genu recurvatum;Antalgic;Decreased trunk rotation;Trunk flexed    Ambulation Surface  Level;Indoor    Stairs  Yes    Stairs Assistance  4: Min assist;5: Supervision    Stairs Assistance Details (indicate cue type and reason)  At home pt negotiates her stairs sideways with both UE on L rail.  Performed with supervision.  Performed forwards with therapist with min A due to fear of L knee buckling    Stair Management Technique  One rail Left;Step to pattern;Sideways;Forwards;With cane    Number of Stairs  12    Height of Stairs  6      Standardized Balance Assessment   Standardized Balance Assessment  Berg Balance Test;Five Times Sit to Stand;10 meter walk test    Five times sit to stand comments   19.97 seconds from mat, without UE support but pressing legs back against mat, uncontrolled descent    10 Meter Walk  25.34 seconds with cane or  1.29 ft/sec      Berg Balance Test   Sit to Stand  Able to stand without using hands and stabilize independently    Standing Unsupported  Able to stand 2 minutes with supervision    Sitting with  Back Unsupported but Feet Supported on Floor or Stool  Able to sit safely and securely 2 minutes    Stand to Sit  Controls descent by using hands    Transfers  Able to transfer safely, definite need of hands    Standing Unsupported with Eyes Closed  Able to stand 10 seconds with supervision    Standing Ubsupported with Feet Together  Able to place feet together independently and stand for 1 minute with supervision    From Standing, Reach Forward with Outstretched Arm  Can reach confidently >25 cm (10")    From Standing Position, Pick up Object from Floor  Able to pick up shoe, needs supervision    From Standing Position, Turn to Look Behind Over each Shoulder  Looks behind from both sides and weight shifts well    Turn 360 Degrees  Able to turn 360 degrees safely but slowly    Standing Unsupported, Alternately Place Feet on Step/Stool  Able to complete >2 steps/needs minimal assist    Standing Unsupported, One Foot in Front  Able to take small step independently and hold 30 seconds    Standing on One Leg  Unable to try or needs assist to prevent fall    Total Score  39    Berg comment:  39/50 high falls risk                           PT Education - 07/13/18 2013    Education Details  Clinical findings following reassessment of outcome measures, areas to continue to address    Person(s) Educated  Patient    Methods  Explanation    Comprehension  Verbalized understanding          PT Long Term Goals - 07/13/18 1601      PT LONG TERM GOAL #1   Title  patient to be independent with advanced HEP     Time  6    Period  Weeks    Status  Achieved      PT LONG TERM GOAL #2   Title  patient to improve gait speed to >/= 3.2 ft/sec with LRAD for improved functional mobility    Baseline  1.29 ft/sec    Time  6    Period  Weeks    Status  Not Met  PT LONG TERM GOAL #3   Title  patient to improve 5x sit to stand by >/= 8 seconds demonstrating improved mobility     Baseline  19 seconds    Time  6    Period  Weeks    Status  Not Met      PT LONG TERM GOAL #4   Title  patient to improve Berg by >/= 6 points demosntrating reduced fall risk    Baseline  39/56 - unchanged    Time  6    Period  Weeks    Status  Not Met      PT LONG TERM GOAL #5   Title  patient to demonstrate reciprocal stair navigation up 1 flight with single handrail without LOB or instability    Baseline  step to with one rail    Time  6    Period  Weeks    Status  Not Met        New goals for recertification: PT Short Term Goals - 07/13/18 2025      PT SHORT TERM GOAL #1   Title  Pt will be independent with initial HEP    Time  4    Period  Weeks    Status  New    Target Date  08/12/18      PT SHORT TERM GOAL #2   Title  Pt will improve gait velocity with LRAD to >/= 1.8 ft/sec    Baseline  1.29 ft/sec with cane    Time  4    Period  Weeks    Status  New    Target Date  08/12/18      PT SHORT TERM GOAL #3   Title  Pt will improve five time sit to stand to </= 15 seconds without use of UE and LE pushing back on mat    Baseline  19 seconds    Time  4    Period  Weeks    Status  New    Target Date  08/12/18      PT SHORT TERM GOAL #4   Title  Pt will improve BERG to >/= 43/56    Baseline  39/56    Time  4    Period  Weeks    Status  New    Target Date  08/12/18      PT SHORT TERM GOAL #5   Title  Pt will report being able to stand at her standing desk at work for 10-12 minutes at a time with pain </= 4/10    Time  4    Period  Weeks    Status  New    Target Date  08/12/18      PT Long Term Goals - 07/13/18 2028      PT LONG TERM GOAL #1   Title  patient to be independent with advanced HEP     Time  8    Period  Weeks    Status  Revised    Target Date  09/11/18      PT LONG TERM GOAL #2   Title  patient to improve gait speed to >/= 2.6 ft/sec with LRAD for improved functional mobility    Time  8    Period  Weeks    Status  Revised    Target  Date  09/11/18      PT LONG TERM GOAL #3   Title  patient to improve 5x sit  to stand to </= 13 seconds without use of UE demonstrating improved mobility    Time  8    Period  Weeks    Status  Revised    Target Date  09/11/18      PT LONG TERM GOAL #4   Title  patient to improve Berg to >/= 46/56 demosntrating reduced fall risk    Time  8    Period  Weeks    Status  Revised    Target Date  09/11/18      PT LONG TERM GOAL #5   Title  patient to demonstrate reciprocal stair navigation up 1 flight with L rail and cane in RUE with supervision    Time  8    Period  Weeks    Status  New    Target Date  09/11/18      Additional Long Term Goals   Additional Long Term Goals  Yes      PT LONG TERM GOAL #6   Title  Pt will report being able to stand at her standing desk at work for 15-18 minutes at a time    Time  8    Period  Weeks    Status  New    Target Date  09/11/18            Plan - 07/13/18 2017    Clinical Impression Statement  Treatment session today focused on re-assessment of pt function and impairments and progress towards LTG.  Pt is making slow progress and met only 1/5 LTG.  Pt experienced a decline in gait velocity, likely due to increased pain today and antalgic gait, five time sit to stand improved but not to goal level, standing balance score remained the same and continues to demonstrate step to sequencing for stair negotiation.  Pt remains at a high risk for falls and will benefit from continued skilled PT services to address these impairments, to decrease pain, maximize functional mobility independence and decrease falls risk.      Rehab Potential  Good    Clinical Impairments Affecting Rehab Potential  HTN    PT Frequency  2x / week    PT Duration  8 weeks    PT Treatment/Interventions  ADLs/Self Care Home Management;Cryotherapy;Moist Heat;Therapeutic exercise;Therapeutic activities;Functional mobility training;Stair training;Gait training;DME  Instruction;Balance training;Neuromuscular re-education;Patient/family education;Manual techniques;Passive range of motion;Dry needling;Taping    PT Next Visit Plan  Review current HEP and revise as needed for LE strengthening, balance.  Step ups/down to work on Industrial/product designer.  SLS activities    PT Home Exercise Plan   Access Code: [email or web address]     Consulted and Agree with Plan of Care  Patient       Patient will benefit from skilled therapeutic intervention in order to improve the following deficits and impairments:  Abnormal gait, Decreased activity tolerance, Decreased balance, Decreased mobility, Difficulty walking, Decreased strength, Impaired flexibility, Pain  Visit Diagnosis: Unsteadiness on feet  Other abnormalities of gait and mobility  Muscle weakness (generalized)  Pain in right hip  Pain in left hip     Problem List Patient Active Problem List   Diagnosis Date Noted  . Greater trochanteric pain syndrome 06/26/2018  . Nondisplaced fracture of navicular bone of left foot with routine healing 12/21/2016  . Closed displaced fracture of navicular bone of left foot 12/12/2016  . Essential hypertension, benign 05/06/2016  . Left Frontal-Parietal CVA 04/11/2016  . ST elevation - in setting of CVA & HTN Emergency -  NOT MI 04/11/2016  . Multiple vessel coronary artery disease 04/11/2016  . Diabetes mellitus type 2, insulin dependent ([place]) 04/11/2016  . Acute respiratory failure with hypoxia ([place]) 04/11/2016  . Hyperlipidemia 04/11/2016  . Acute pulmonary edema (HCC)   . Hypertensive emergency   . Acute appendicitis 02/29/2016   Rico Junker, PT, DPT 07/13/18    8:25 PM    [facility] [address] [place], [place], [address] Phone: [phone]   Fax:  [phone]  Name: MAUDINE KLUESNER MRN: [id number] Date of Birth: [birth year]

## 2018-07-14 ENCOUNTER — Telehealth: Payer: Self-pay | Admitting: Sports Medicine

## 2018-07-14 DIAGNOSIS — E1121 Type 2 diabetes mellitus with diabetic nephropathy: Secondary | ICD-10-CM | POA: Diagnosis not present

## 2018-07-14 DIAGNOSIS — Z8673 Personal history of transient ischemic attack (TIA), and cerebral infarction without residual deficits: Secondary | ICD-10-CM | POA: Diagnosis not present

## 2018-07-14 DIAGNOSIS — Z87891 Personal history of nicotine dependence: Secondary | ICD-10-CM | POA: Diagnosis not present

## 2018-07-14 DIAGNOSIS — R279 Unspecified lack of coordination: Secondary | ICD-10-CM | POA: Diagnosis not present

## 2018-07-14 DIAGNOSIS — I10 Essential (primary) hypertension: Secondary | ICD-10-CM | POA: Diagnosis not present

## 2018-07-14 NOTE — Telephone Encounter (Signed)
Copied from CRM [id number]. Topic: General - Other >> Jul 14, 2018 10:55 AM Floria Raveling A wrote: Reason for CRM:  Pt is coming in for an injection in a week with Dr Berline Chough, pt is requesting not to see the setup of the needle as she walks in the room.  She also would like to know if she will need to have someone to driver her?

## 2018-07-14 NOTE — Telephone Encounter (Signed)
Called pt and answered her questions regarding upcoming hip injection.

## 2018-07-15 ENCOUNTER — Ambulatory Visit: Payer: 59 | Admitting: Physical Therapy

## 2018-07-20 ENCOUNTER — Ambulatory Visit: Payer: 59 | Admitting: Physical Therapy

## 2018-07-20 ENCOUNTER — Encounter: Payer: Self-pay | Admitting: Sports Medicine

## 2018-07-20 ENCOUNTER — Ambulatory Visit: Payer: Self-pay

## 2018-07-20 ENCOUNTER — Ambulatory Visit (INDEPENDENT_AMBULATORY_CARE_PROVIDER_SITE_OTHER): Payer: 59 | Admitting: Sports Medicine

## 2018-07-20 VITALS — BP 150/82 | HR 83 | Ht 64.0 in | Wt 214.0 lb

## 2018-07-20 DIAGNOSIS — M25559 Pain in unspecified hip: Secondary | ICD-10-CM

## 2018-07-20 DIAGNOSIS — M25551 Pain in right hip: Secondary | ICD-10-CM

## 2018-07-20 DIAGNOSIS — R269 Unspecified abnormalities of gait and mobility: Secondary | ICD-10-CM | POA: Diagnosis not present

## 2018-07-20 DIAGNOSIS — R262 Difficulty in walking, not elsewhere classified: Secondary | ICD-10-CM

## 2018-07-20 DIAGNOSIS — R29898 Other symptoms and signs involving the musculoskeletal system: Secondary | ICD-10-CM | POA: Diagnosis not present

## 2018-07-20 NOTE — Procedures (Signed)
PROCEDURE NOTE:  Ultrasound Guided: Injection: Right hip, Greater trochanteric Images were obtained and interpreted by myself, Gaspar Bidding, DO  Images have been saved and stored to PACS system. Images obtained on: GE S7 Ultrasound machine    ULTRASOUND FINDINGS:  Hip abductor's have a small interstitial split at the insertion of the greater trochanter with some calcification consistent with possible calcific tendinitis.  Minimal bursal formation.  DESCRIPTION OF PROCEDURE:  The patient's clinical condition is marked by substantial pain and/or significant functional disability. Other conservative therapy has not provided relief, is contraindicated, or not appropriate. There is a reasonable likelihood that injection will significantly improve the patient's pain and/or functional impairment.   After discussing the risks, benefits and expected outcomes of the injection and all questions were reviewed and answered, the patient wished to undergo the above named procedure.  Verbal consent was obtained.  The ultrasound was used to identify the target structure and adjacent neurovascular structures. The skin was then prepped in sterile fashion and the target structure was injected under direct visualization using sterile technique as below:  Single injection performed as below: PREP: Alcohol and Ethel Chloride APPROACH:direct, stopcock technique, 22g 3.5 in. INJECTATE: 5 cc 1% lidocaine, 2 cc 0.5% Marcaine and 2 cc 40mg /mL DepoMedrol ASPIRATE: None DRESSING: Band-Aid  Post procedural instructions including recommending icing and warning signs for infection were reviewed.    This procedure was well tolerated and there were no complications.   IMPRESSION: Succesful Ultrasound Guided: Injection

## 2018-07-20 NOTE — Progress Notes (Signed)
  Rebecca FellsMichael D. Delorise Shinerigby, DO  [facility] [phone]  [name] - 58 y.o. female MRN [id number]  Date of birth: 1960-08-31  Visit Date: July 20, 2018  PCP: Rebecca Wagner, Betti, MD   Referred by: Rebecca Wagner, Betti, MD  SUBJECTIVE:  Chief Complaint  Patient presents with  . Right Hip - Follow-up    XR L-spine and R hip 05/06/2018. Has tried Pennsaid, Gabapentin, Voltaren Gel, Flexeril, and Topical Analgesic Patch. Has been to PT, provided with HEP, and ambulating with walker.   . f/u R hip pain    HPI: Patient is here for follow-up of the right lateral hip pain she has been having for quite some time.  She has been working with physical therapy and noticing an increasingly difficult time with her gait.  She has tried the above treatments with minimal improvement.  She is interested in right hip injection today.  She denies any fevers or chills but has had 2 falls.  No associated trauma with these falls.  REVIEW OF SYSTEMS: Per HPI  HISTORY:  Prior history reviewed and updated per electronic medical record.  Social History   Occupational History  . Not on file  Tobacco Use  . Smoking status: Former Games developermoker  . Smokeless tobacco: Never Used  . Tobacco comment: quit smoking in 2010  Substance and Sexual Activity  . Alcohol use: No  . Drug use: No  . Sexual activity: Yes    Birth control/protection: Post-menopausal   Social History   Social History Narrative  . Not on file    OBJECTIVE:  VS:  HT:5\' 4"  (162.6 cm)   WT:214 lb (97.1 kg)  BMI:36.72    BP:(!) 150/82  HR:83bpm  TEMP: ( )  RESP:98 %   PHYSICAL EXAM: Adult obese female.  No acute distress.  Alert and appropriate.  Right hip has tenderness over the greater trochanter.  She has good internal and external rotation of the hip.  Weakness with side-lying hip abduction.   ASSESSMENT  1. Right hip pain   2. Greater trochanteric pain syndrome   3. Difficulty walking     4. Weakness of both hips   5. Gait disturbance      PROCEDURES:  US Guided Injection per procedure note    PLAN:  Pertinent additional documentation may be included in corresponding procedure notes, imaging studies, problem based documentation and patient instructions.  She should do well with this injection as well as combined with physical therapy.  She has had worsening symptoms over the past several months and will likely benefit from continued PT.  She may get some additional systemic effect from the steroid but if any lack of improvement further diagnostic evaluation of her hips and/or lumbar spine will be pursued with MRI.  Activity modifications and the importance of avoiding exacerbating activities (limiting pain to no more than a 4 / 10 during or following activity) recommended and discussed. Discussed red flag symptoms that warrant earlier emergent evaluation and patient voices understanding. No orders of the defined types were placed in this encounter.  Lab Orders  No laboratory test(s) ordered today    Imaging Orders     US MSK POCT ULTRASOUND Referral Orders  No referral(s) requested today    Return in about 6 weeks (around 08/31/2018).          Andrena MewsMichael D Rigby, DO    [facility] Physician

## 2018-07-20 NOTE — Patient Instructions (Signed)

## 2018-07-23 ENCOUNTER — Ambulatory Visit: Payer: 59

## 2018-07-23 ENCOUNTER — Telehealth: Payer: Self-pay | Admitting: Sports Medicine

## 2018-07-23 ENCOUNTER — Other Ambulatory Visit: Payer: Self-pay

## 2018-07-23 DIAGNOSIS — M5441 Lumbago with sciatica, right side: Secondary | ICD-10-CM | POA: Diagnosis not present

## 2018-07-23 DIAGNOSIS — M25552 Pain in left hip: Secondary | ICD-10-CM | POA: Diagnosis not present

## 2018-07-23 DIAGNOSIS — R2689 Other abnormalities of gait and mobility: Secondary | ICD-10-CM | POA: Diagnosis not present

## 2018-07-23 DIAGNOSIS — M6281 Muscle weakness (generalized): Secondary | ICD-10-CM | POA: Diagnosis not present

## 2018-07-23 DIAGNOSIS — G8929 Other chronic pain: Secondary | ICD-10-CM | POA: Diagnosis not present

## 2018-07-23 DIAGNOSIS — R2681 Unsteadiness on feet: Secondary | ICD-10-CM

## 2018-07-23 DIAGNOSIS — M25551 Pain in right hip: Secondary | ICD-10-CM | POA: Diagnosis not present

## 2018-07-23 MED ORDER — DICLOFENAC SODIUM 2 % TD SOLN: 1.0000 "application " | Freq: Two times a day (BID) | TRANSDERMAL | 2 refills | Status: AC

## 2018-07-23 NOTE — Patient Instructions (Signed)
Access Code: [email or web address]  URL: [email or web address]  Date: 07/23/2018  Prepared by: Zerita Boers   Exercises  Sit to Stand without Arm Support - 10 reps - 3 sets - 2x daily - 7x weekly  Standing Tandem Balance with Counter Support - 2 sets - 10 hold - 1x daily - 7x weekly  Single Leg Stance with Support - 3 sets - 10 hold - 1x daily - 7x weekly  Standing Hip Abduction with Counter Support - 10 reps - 2 sets - 1x daily - 7x weekly  Walking with Counter Support - 5 reps - 1x daily - 7x weekly  Backward Walking with Counter Support - 5 reps - 1x daily - 7x weekly  Standing March with Counter Support - 10 reps - 2 sets - 1x daily - 7x weekly

## 2018-07-23 NOTE — Therapy (Signed)
[facility] [address] [place], [place], [address] Phone: [phone]   Fax:  [phone]  Physical Therapy Treatment  Patient Details  Name: Rebecca Wagner MRN: [id number] Date of Birth: 13-Mar-1961 Referring Provider (PT): Andrena Mews, DO   Encounter Date: 07/23/2018  PT End of Session - 07/23/18 1624    Visit Number  6    Number of Visits  21    Date for PT Re-Evaluation  09/11/18    Authorization Type  [facility]    PT Start Time  1534    PT Stop Time  1614    PT Time Calculation (min)  40 min    Equipment Utilized During Treatment  --   min guard to S prn   Activity Tolerance  Patient tolerated treatment well    Behavior During Therapy  [facility] for tasks assessed/performed       Past Medical History:  Diagnosis Date  . Arthritis   . Depression   . Diabetes mellitus without complication (HCC)   . Hypertension     Past Surgical History:  Procedure Laterality Date  . CARDIAC CATHETERIZATION N/A 04/10/2016   Procedure: Left Heart Cath and Coronary Angiography;  Surgeon: Iran Ouch, MD;  [facility];  Service: Cardiovascular;  Laterality: N/A;  . CESAREAN SECTION    . LAPAROSCOPIC APPENDECTOMY N/A 02/29/2016   Procedure: LAPAROSCOPIC APPENDECTOMY;  Surgeon: Axel Filler, MD;  Location: [facility] OR;  Service: General;  Laterality: N/A;    There were no vitals filed for this visit.  Subjective Assessment - 07/23/18 1538    Subjective  Pt denied falls since last visit. Pt had a steroid injection in R hip on Monday and was in a lot of pain, so she missed her PT appt. Pt hasn't performed HEP 2/2 pain.     Pertinent History  CVA, HTN, DM, arthritis    Limitations  Walking;Standing    Patient Stated Goals  deal with leg pain    Currently in Pain?  Yes    Pain Score  --   4-5/10   Pain Location  Hip    Pain Orientation  Right    Pain Descriptors / Indicators  Sore    Pain Type  Chronic  pain    Pain Radiating Towards  LE    Pain Onset  More than a month ago    Pain Frequency  Intermittent    Aggravating Factors   standing and walking up straight-leans forward onto RW    Pain Relieving Factors  medication            Therex and Neuro re-ed: Access Code: [email or web address]  URL: [email or web address]  Date: 07/23/2018  Prepared by: Zerita Boers   Exercises  Sit to Stand without Arm Support - 10 reps - 3 sets - 2x daily - 7x weekly (NMR) Standing Tandem Balance with Counter Support - 2 sets - 10 hold - 1x daily - 7x weekly (NMR) Single Leg Stance with Support - 3 sets - 10 hold - 1x daily - 7x weekly (NMR) Standing Hip Abduction with Counter Support - 10 reps - 2 sets - 1x daily - 7x weekly (therex) Walking with Counter Support - 5 reps - 1x daily - 7x weekly (NMR) Backward Walking with Counter Support - 5 reps - 1x daily - 7x weekly (NMR) Standing March with Counter Support - 10 reps - 2 sets - 1x daily - 7x weekly (therex)  Pt  performed all activities with 1-2 UE support and min guard to S for safety. Cues to improve eccentric control during therex and weight shifting during NMR activities. Pt reported pain remained approx the same during activities (3-5/10).                     PT Education - 07/23/18 1622    Education Details  PT reviewed HEP. PT encouraged pt to stand upright (take breaks every 1-2 hours at work) and adjusted RW to promote upright posture vs. leaning on forearms. PT discussed the importance of performing HEP in order to improve strength and pain. Rehab tech added tennis balls to pt's RW to reduce friction.     Person(s) Educated  Patient    Methods  Explanation;Demonstration;Handout;Verbal cues    Comprehension  Verbalized understanding;Returned demonstration;Need further instruction       PT Short Term Goals - 07/13/18 2025      PT SHORT TERM GOAL #1   Title  Pt will be independent with initial HEP    Time  4     Period  Weeks    Status  New    Target Date  08/12/18      PT SHORT TERM GOAL #2   Title  Pt will improve gait velocity with LRAD to >/= 1.8 ft/sec    Baseline  1.29 ft/sec with cane    Time  4    Period  Weeks    Status  New    Target Date  08/12/18      PT SHORT TERM GOAL #3   Title  Pt will improve five time sit to stand to </= 15 seconds without use of UE and LE pushing back on mat    Baseline  19 seconds    Time  4    Period  Weeks    Status  New    Target Date  08/12/18      PT SHORT TERM GOAL #4   Title  Pt will improve BERG to >/= 43/56    Baseline  39/56    Time  4    Period  Weeks    Status  New    Target Date  08/12/18      PT SHORT TERM GOAL #5   Title  Pt will report being able to stand at her standing desk at work for 10-12 minutes at a time with pain </= 4/10    Time  4    Period  Weeks    Status  New    Target Date  08/12/18        PT Long Term Goals - 07/13/18 2028      PT LONG TERM GOAL #1   Title  patient to be independent with advanced HEP     Time  8    Period  Weeks    Status  Revised    Target Date  09/11/18      PT LONG TERM GOAL #2   Title  patient to improve gait speed to >/= 2.6 ft/sec with LRAD for improved functional mobility    Time  8    Period  Weeks    Status  Revised    Target Date  09/11/18      PT LONG TERM GOAL #3   Title  patient to improve 5x sit to stand to </= 13 seconds without use of UE demonstrating improved mobility    Time  8  Period  Weeks    Status  Revised    Target Date  09/11/18      PT LONG TERM GOAL #4   Title  patient to improve Berg to >/= 46/56 demosntrating reduced fall risk    Time  8    Period  Weeks    Status  Revised    Target Date  09/11/18      PT LONG TERM GOAL #5   Title  patient to demonstrate reciprocal stair navigation up 1 flight with L rail and cane in RUE with supervision    Time  8    Period  Weeks    Status  New    Target Date  09/11/18      Additional Long Term Goals    Additional Long Term Goals  Yes      PT LONG TERM GOAL #6   Title  Pt will report being able to stand at her standing desk at work for 15-18 minutes at a time    Time  8    Period  Weeks    Status  New    Target Date  09/11/18            Plan - 07/23/18 1546    Clinical Impression Statement  Today's skilled session focused on reviewing HEP to ensure proper technique, as pt has not performed 2/2 pain. Pt demonstrated progress, as she required only one seated rest break during HEP and one after performing HEP. Pt would continue to benefit from skilled PT to improve safety during functional mobility.     Rehab Potential  Good    Clinical Impairments Affecting Rehab Potential  HTN    PT Frequency  2x / week    PT Duration  8 weeks    PT Treatment/Interventions  ADLs/Self Care Home Management;Cryotherapy;Moist Heat;Therapeutic exercise;Therapeutic activities;Functional mobility training;Stair training;Gait training;DME Instruction;Balance training;Neuromuscular re-education;Patient/family education;Manual techniques;Passive range of motion;Dry needling;Taping    PT Next Visit Plan  Trial hip/back stretches. Step ups/down to work on Psychologist, counsellingstair negotiation.  SLS activities    PT Home Exercise Plan   Access Code: [email or web address]     Consulted and Agree with Plan of Care  Patient       Patient will benefit from skilled therapeutic intervention in order to improve the following deficits and impairments:  Abnormal gait, Decreased activity tolerance, Decreased balance, Decreased mobility, Difficulty walking, Decreased strength, Impaired flexibility, Pain  Visit Diagnosis: Other abnormalities of gait and mobility  Muscle weakness (generalized)  Unsteadiness on feet     Problem List Patient Active Problem List   Diagnosis Date Noted  . Greater trochanteric pain syndrome 06/26/2018  . Nondisplaced fracture of navicular bone of left foot with routine healing 12/21/2016  . Closed displaced  fracture of navicular bone of left foot 12/12/2016  . Essential hypertension, benign 05/06/2016  . Left Frontal-Parietal CVA 04/11/2016  . ST elevation - in setting of CVA & HTN Emergency - NOT MI 04/11/2016  . Multiple vessel coronary artery disease 04/11/2016  . Diabetes mellitus type 2, insulin dependent (HCC) 04/11/2016  . Acute respiratory failure with hypoxia (HCC) 04/11/2016  . Hyperlipidemia 04/11/2016  . Acute pulmonary edema (HCC)   . Hypertensive emergency   . Acute appendicitis 02/29/2016    Alfredo Collymore L 07/23/2018, 4:28 PM  [facility] [address] [place], [place], [address] Phone: [phone]   Fax:  [phone]  Name: Stevphen MeuseValerie S Blankenhorn MRN: [id number] Date of Birth: 1961-05-31  Zerita BoersJennifer Yaseen Gilberg, PT,DPT 07/23/18 4:30 PM Phone: [phone] Fax: [phone]

## 2018-07-23 NOTE — Telephone Encounter (Signed)
Rx has been refilled. Called pt, VM full.

## 2018-07-23 NOTE — Telephone Encounter (Signed)
See note  Copied from CRM [id number]. Topic: General - Other >> Jul 23, 2018  9:52 AM Rebecca Wagner wrote: Reason for CRM:  Pt states that the day after the injection she was at Wagner 10 plus on the pain scale.  Today she is at Wagner 6 or 7.  She has put pensaid on it and has helped some.  She would like to know how long it takes for the injection to kick in?  She also wanted to let Dr Berline Chough know that she will be send FMLA.   She also is requesting Wagner refill on the Pensaid

## 2018-07-24 NOTE — Telephone Encounter (Signed)
Called pt and advised that rx for Pennsaid has been refilled. Also advised, per AVS, pain can continues or increase slightly during the first 24-48 hours but should gradually improved over the next 2 weeks, out to 6 weeks after the injection. Pt verbalized understanding.

## 2018-07-25 ENCOUNTER — Encounter: Payer: Self-pay | Admitting: Sports Medicine

## 2018-07-25 NOTE — Assessment & Plan Note (Signed)
Hip x-rays are reassuring today however she likely has a significant component of greater trochanteric pain syndrome and significant weakness with hip abductor's.  She will benefit from formal physical therapy

## 2018-07-27 ENCOUNTER — Ambulatory Visit: Payer: 59 | Admitting: Physical Therapy

## 2018-07-27 DIAGNOSIS — M6281 Muscle weakness (generalized): Secondary | ICD-10-CM

## 2018-07-27 DIAGNOSIS — R2681 Unsteadiness on feet: Secondary | ICD-10-CM | POA: Diagnosis not present

## 2018-07-27 DIAGNOSIS — M5441 Lumbago with sciatica, right side: Secondary | ICD-10-CM | POA: Diagnosis not present

## 2018-07-27 DIAGNOSIS — G8929 Other chronic pain: Secondary | ICD-10-CM | POA: Diagnosis not present

## 2018-07-27 DIAGNOSIS — R2689 Other abnormalities of gait and mobility: Secondary | ICD-10-CM

## 2018-07-27 DIAGNOSIS — M25551 Pain in right hip: Secondary | ICD-10-CM

## 2018-07-27 DIAGNOSIS — M25552 Pain in left hip: Secondary | ICD-10-CM | POA: Diagnosis not present

## 2018-07-27 MED FILL — FREESTYLE LIBRE 14 DAY READ: 90 days supply | Qty: 1 | Fill #0

## 2018-07-27 NOTE — Therapy (Signed)
[facility] [address] [place], [place], [address] Phone: [phone]   Fax:  [phone]  Physical Therapy Treatment  Patient Details  Name: Rebecca Wagner MRN: [id number] Date of Birth: March 03, 1961 Referring Provider (PT): Andrena MewsMichael D Rigby, DO   Encounter Date: 07/27/2018  PT End of Session - 07/27/18 2125    Visit Number  7    Number of Visits  21    Date for PT Re-Evaluation  09/11/18    Authorization Type  [facility] UMR - 10th visit PN    PT Start Time  1530    PT Stop Time  1615    PT Time Calculation (min)  45 min    Equipment Utilized During Treatment  --    Activity Tolerance  Patient limited by pain    Behavior During Therapy  [facility] for tasks assessed/performed       Past Medical History:  Diagnosis Date  . Arthritis   . Depression   . Diabetes mellitus without complication (HCC)   . Hypertension     Past Surgical History:  Procedure Laterality Date  . CARDIAC CATHETERIZATION N/A 04/10/2016   Procedure: Left Heart Cath and Coronary Angiography;  Surgeon: Iran OuchMuhammad A Arida, MD;  [facility];  Service: Cardiovascular;  Laterality: N/A;  . CESAREAN SECTION    . LAPAROSCOPIC APPENDECTOMY N/A 02/29/2016   Procedure: LAPAROSCOPIC APPENDECTOMY;  Surgeon: Axel FillerArmando Ramirez, MD;  [facility];  Service: General;  Laterality: N/A;    There were no vitals filed for this visit.  Subjective Assessment - 07/27/18 1552    Subjective  R hip is still hurting quite a bit; Still walking with RW and intermittently has to lean fully on RW in trunk flexion to relieve pain in back and hip.    Pertinent History  CVA, HTN, DM, arthritis    Limitations  Walking;Standing    Patient Stated Goals  deal with leg pain    Currently in Pain?  Yes    Pain Onset  More than a month ago         [facility] - 07/27/18 2114      Palpation   Spinal mobility  hypomobile R lumbar spine.  Did not recreate  patient's pain with palpation and grade 1 mobilizations to R transverse processes of lumbar spine    SI assessment   Significant tenderness to palpation over R greater trochanter and piriformis muscle.  Palpation over sciatic notch recreated pt's referred pain                   [facility] Adult PT Treatment/Exercise - 07/27/18 2114      Ambulation/Gait   Ambulation/Gait  Yes    Ambulation/Gait Assistance  5: Supervision    Ambulation/Gait Assistance Details  pt ambulating with RW today instead of quad cane due to increase in hip pain.  Pt also demonstrating more flexed posture and leaning on RW for support and to decrease pain in low back and hip.    Ambulation Distance (Feet)  50 Feet    Assistive device  Rolling walker    Gait Pattern  Step-to pattern;Decreased step length - right;Decreased step length - left;Decreased stride length;Shuffle;Trunk flexed;Poor foot clearance - left;Poor foot clearance - right    Ambulation Surface  Level;Indoor      Therapeutic Activites    Therapeutic Activities  Other Therapeutic Activities    Other Therapeutic Activities  discussed addition of TDN for treatment  of pain and decreased ROM.  Discussed indications for use and possible side effects.  Pt would like to use TDN as a last resort      Knee/Hip Exercises: Stretches   Passive Hamstring Stretch  Right;3 reps;60 seconds    Hip Flexor Stretch  Both;60 seconds    Hip Flexor Stretch Limitations  in prone while performing palpation to lumbar spine and SI region    Piriformis Stretch  Right;3 reps;60 seconds    Piriformis Stretch Limitations  supine figure 4 stretch with assistance to maintain placement of R foot; also performed straight leg stretch across midline to increase hip ER and lateral hip ROM    Gastroc Stretch  Right;3 reps;30 seconds    Gastroc Stretch Limitations  while performing HS stretch             PT Education - 07/27/18 2125    Education Details  see TA; discussed  patient's pain and difference between referred pain from lumbar spine vs. tendonopathy vs. sciatica vs. OA of R hip.      Person(s) Educated  Patient    Methods  Explanation    Comprehension  Verbalized understanding       PT Short Term Goals - 07/13/18 2025      PT SHORT TERM GOAL #1   Title  Pt will be independent with initial HEP    Time  4    Period  Weeks    Status  New    Target Date  08/12/18      PT SHORT TERM GOAL #2   Title  Pt will improve gait velocity with LRAD to >/= 1.8 ft/sec    Baseline  1.29 ft/sec with cane    Time  4    Period  Weeks    Status  New    Target Date  08/12/18      PT SHORT TERM GOAL #3   Title  Pt will improve five time sit to stand to </= 15 seconds without use of UE and LE pushing back on mat    Baseline  19 seconds    Time  4    Period  Weeks    Status  New    Target Date  08/12/18      PT SHORT TERM GOAL #4   Title  Pt will improve BERG to >/= 43/56    Baseline  39/56    Time  4    Period  Weeks    Status  New    Target Date  08/12/18      PT SHORT TERM GOAL #5   Title  Pt will report being able to stand at her standing desk at work for 10-12 minutes at a time with pain </= 4/10    Time  4    Period  Weeks    Status  New    Target Date  08/12/18        PT Long Term Goals - 07/13/18 2028      PT LONG TERM GOAL #1   Title  patient to be independent with advanced HEP     Time  8    Period  Weeks    Status  Revised    Target Date  09/11/18      PT LONG TERM GOAL #2   Title  patient to improve gait speed to >/= 2.6 ft/sec with LRAD for improved functional mobility    Time  8  Period  Weeks    Status  Revised    Target Date  09/11/18      PT LONG TERM GOAL #3   Title  patient to improve 5x sit to stand to </= 13 seconds without use of UE demonstrating improved mobility    Time  8    Period  Weeks    Status  Revised    Target Date  09/11/18      PT LONG TERM GOAL #4   Title  patient to improve Berg to >/= 46/56  demosntrating reduced fall risk    Time  8    Period  Weeks    Status  Revised    Target Date  09/11/18      PT LONG TERM GOAL #5   Title  patient to demonstrate reciprocal stair navigation up 1 flight with L rail and cane in RUE with supervision    Time  8    Period  Weeks    Status  New    Target Date  09/11/18      Additional Long Term Goals   Additional Long Term Goals  Yes      PT LONG TERM GOAL #6   Title  Pt will report being able to stand at her standing desk at work for 15-18 minutes at a time    Time  8    Period  Weeks    Status  New    Target Date  09/11/18            Plan - 07/27/18 2128    Clinical Impression Statement  Continued to assess patient's pain and referral pattern with palpation.  Palpation of R piriformis/obturator muscles and sciatic notch did reproduce patient's pain and referral pattern.  Therapist provided PROM to R hamstring, gastroc and piriformis muscles as well as hip flexors.  At end of session pt reported improvement in pain and requested to learn how to perform these stretches at home.  Will continue to address and progress towards LTG.    Rehab Potential  Good    Clinical Impairments Affecting Rehab Potential  HTN    PT Frequency  2x / week    PT Duration  8 weeks    PT Treatment/Interventions  ADLs/Self Care Home Management;Cryotherapy;Moist Heat;Therapeutic exercise;Therapeutic activities;Functional mobility training;Stair training;Gait training;DME Instruction;Balance training;Neuromuscular re-education;Patient/family education;Manual techniques;Passive range of motion;Dry needling;Taping    PT Next Visit Plan  Appears to have a piriformis syndrome/sciatica type of hip pain.     Have pt try supine piriformis and hamstring stretches for home with strap - add to HEP.  Step ups/down to work on Psychologist, counselling.  SLS activities.  Would she be open to TDN of piriformis?      PT Home Exercise Plan   Access Code: [email or web address]     Consulted and  Agree with Plan of Care  Patient       Patient will benefit from skilled therapeutic intervention in order to improve the following deficits and impairments:  Abnormal gait, Decreased activity tolerance, Decreased balance, Decreased mobility, Difficulty walking, Decreased strength, Impaired flexibility, Pain  Visit Diagnosis: Other abnormalities of gait and mobility  Muscle weakness (generalized)  Unsteadiness on feet  Pain in right hip  Chronic bilateral low back pain with right-sided sciatica     Problem List Patient Active Problem List   Diagnosis Date Noted  . Greater trochanteric pain syndrome 06/26/2018  . Nondisplaced fracture of navicular bone of left foot with routine  healing 12/21/2016  . Closed displaced fracture of navicular bone of left foot 12/12/2016  . Essential hypertension, benign 05/06/2016  . Left Frontal-Parietal CVA 04/11/2016  . ST elevation - in setting of CVA & HTN Emergency - NOT MI 04/11/2016  . Multiple vessel coronary artery disease 04/11/2016  . Diabetes mellitus type 2, insulin dependent (HCC) 04/11/2016  . Acute respiratory failure with hypoxia (HCC) 04/11/2016  . Hyperlipidemia 04/11/2016  . Acute pulmonary edema (HCC)   . Hypertensive emergency   . Acute appendicitis 02/29/2016   Dierdre Highman, PT, DPT 07/27/18    9:37 PM    [facility] [address] [place], [place], [address] Phone: [phone]   Fax:  [phone]  Name: Rebecca Wagner MRN: [id number] Date of Birth: 26-Dec-1960

## 2018-07-29 DIAGNOSIS — R279 Unspecified lack of coordination: Secondary | ICD-10-CM | POA: Diagnosis not present

## 2018-07-29 DIAGNOSIS — Z8673 Personal history of transient ischemic attack (TIA), and cerebral infarction without residual deficits: Secondary | ICD-10-CM | POA: Diagnosis not present

## 2018-07-29 DIAGNOSIS — I10 Essential (primary) hypertension: Secondary | ICD-10-CM | POA: Diagnosis not present

## 2018-07-29 DIAGNOSIS — E1121 Type 2 diabetes mellitus with diabetic nephropathy: Secondary | ICD-10-CM | POA: Diagnosis not present

## 2018-07-30 ENCOUNTER — Ambulatory Visit: Payer: 59 | Admitting: Physical Therapy

## 2018-07-31 NOTE — Telephone Encounter (Signed)
See note

## 2018-07-31 NOTE — Telephone Encounter (Signed)
[employer] is calling in to speak with someone about PA for pt's medication. They would like a call back at : [phone], they are stating that they need more information in order to complete PA for Pennsaid medication.

## 2018-08-03 ENCOUNTER — Telehealth: Payer: Self-pay

## 2018-08-03 ENCOUNTER — Ambulatory Visit: Payer: 59 | Admitting: Physical Therapy

## 2018-08-03 DIAGNOSIS — Z0279 Encounter for issue of other medical certificate: Secondary | ICD-10-CM

## 2018-08-03 NOTE — Telephone Encounter (Signed)
Employee Medical Certification form in response to an accomodation request.   Comeplete and fax to [employer] ATTN Evangeline Dakin at [phone].   Forms due by 08/16/2018.

## 2018-08-03 NOTE — Telephone Encounter (Signed)
PA form received from [facility] and faxed back to [phone].

## 2018-08-03 NOTE — Telephone Encounter (Signed)
Called pt, VM full. Need to know if this request is for work place accommodations, continuous leave, or intermittent leave.

## 2018-08-04 MED FILL — FREESTYLE LIBRE 14 DAY SENS: 28 days supply | Qty: 2 | Fill #0

## 2018-08-04 MED FILL — cloNIDine HCL 0.2 MG TABS: 0.2 | 30 days supply | Qty: 60 | Fill #3 | Status: TO

## 2018-08-04 NOTE — Telephone Encounter (Signed)
Form faxed and copy at the front for pt to pick up.

## 2018-08-04 NOTE — Telephone Encounter (Signed)
Completed and returned

## 2018-08-04 NOTE — Telephone Encounter (Signed)
Forms to Dr. Berline Chough

## 2018-08-05 ENCOUNTER — Encounter: Payer: Self-pay | Admitting: Physical Therapy

## 2018-08-05 ENCOUNTER — Ambulatory Visit: Payer: 59 | Attending: Nurse Practitioner | Admitting: Physical Therapy

## 2018-08-05 DIAGNOSIS — M6281 Muscle weakness (generalized): Secondary | ICD-10-CM | POA: Diagnosis not present

## 2018-08-05 DIAGNOSIS — G8929 Other chronic pain: Secondary | ICD-10-CM | POA: Insufficient documentation

## 2018-08-05 DIAGNOSIS — M25551 Pain in right hip: Secondary | ICD-10-CM | POA: Insufficient documentation

## 2018-08-05 DIAGNOSIS — M5441 Lumbago with sciatica, right side: Secondary | ICD-10-CM | POA: Diagnosis not present

## 2018-08-05 DIAGNOSIS — R2689 Other abnormalities of gait and mobility: Secondary | ICD-10-CM | POA: Diagnosis not present

## 2018-08-05 DIAGNOSIS — R2681 Unsteadiness on feet: Secondary | ICD-10-CM | POA: Diagnosis not present

## 2018-08-05 NOTE — Patient Instructions (Signed)
Access Code: [email or web address]  URL: [email or web address]  Date: 08/05/2018  Prepared by: Modena Morrow   Exercises  Sit to Stand without Arm Support - 10 reps - 3 sets - 2x daily - 7x weekly  Standing Tandem Balance with Counter Support - 2 sets - 10 hold - 1x daily - 7x weekly  Single Leg Stance with Support - 3 sets - 10 hold - 1x daily - 7x weekly  Standing Hip Abduction with Counter Support - 10 reps - 2 sets - 1x daily - 7x weekly  Walking with Counter Support - 5 reps - 1x daily - 7x weekly  Backward Walking with Counter Support - 5 reps - 1x daily - 7x weekly  Standing March with Counter Support - 10 reps - 2 sets - 1x daily - 7x weekly  Supine Hamstring Stretch with Strap - 3 sets - 30 seconds hold - 2x daily - 7x weekly  Supine Figure 4 Piriformis Stretch - 3 sets - 30 seconds hold - 2x daily - 7x weekly

## 2018-08-05 NOTE — Therapy (Signed)
[facility] [address] [place], [place], [address] Phone: [phone]   Fax:  [phone]  Physical Therapy Treatment  Patient Details  Name: Rebecca Wagner MRN: [id number] Date of Birth: 01/26/1961 Referring Provider (PT): Andrena MewsMichael D Rigby, DO   Encounter Date: 08/05/2018  PT End of Session - 08/05/18 1636    Visit Number  8    Number of Visits  21    Date for PT Re-Evaluation  09/11/18    Authorization Type  [facility] - 10th visit PN    PT Start Time  1534    PT Stop Time  1616    PT Time Calculation (min)  42 min    Activity Tolerance  Patient tolerated treatment well    Behavior During Therapy  [facility] for tasks assessed/performed       Past Medical History:  Diagnosis Date  . Arthritis   . Depression   . Diabetes mellitus without complication (HCC)   . Hypertension     Past Surgical History:  Procedure Laterality Date  . CARDIAC CATHETERIZATION N/A 04/10/2016   Procedure: Left Heart Cath and Coronary Angiography;  Surgeon: Iran OuchMuhammad A Arida, MD;  [facility];  Service: Cardiovascular;  Laterality: N/A;  . CESAREAN SECTION    . LAPAROSCOPIC APPENDECTOMY N/A 02/29/2016   Procedure: LAPAROSCOPIC APPENDECTOMY;  Surgeon: Axel FillerArmando Ramirez, MD;  [facility];  Service: General;  Laterality: N/A;    There were no vitals filed for this visit.  Subjective Assessment - 08/05/18 1540    Subjective  Pt reports having a fall last week while trying to reach behind her to pull the door shut and fell backwards and to the side.  Had difficulty getting up from the floor.  Feels like her arms are weak.    Pertinent History  CVA, HTN, DM, arthritis    Limitations  Walking;Standing    Patient Stated Goals  deal with leg pain    Currently in Pain?  Yes    Pain Score  4     Pain Location  Leg    Pain Orientation  Left    Pain Descriptors / Indicators  Aching    Pain Type  Chronic pain    Pain Onset  More  than a month ago    Pain Frequency  Intermittent    Aggravating Factors   weather, first thing in the morning    Pain Relieving Factors  get moving more    Effect of Pain on Daily Activities  Cant stand longer than 10-15 minutes    Multiple Pain Sites  No          [facility] Adult PT Treatment/Exercise - 08/05/18 0001      Transfers   Transfers  Floor to Transfer    Floor to Transfer  4: Min assist;With upper extremity assist;Multiple attempts;Other (comment)    Floor to Transfer Details (indicate cue type and reason)  Practiced transfer from floor to mat due to patients fall since last visit.  Had pt move from standing at mat to tall kneeling.  Worked on weight shifting in tall kneeling progressing to bringing R foot forward to prepare for standing.  Pt able to bring R foot forward without assist.  Leans forward onto mat with Bil UEE's to come up to standiing.  Pt needed overall min assist with min verbal and visual cues.  Discussed how to come from side sitting to on hands and knees (did not  practice due to patient fatigue).    Number of Reps  2 sets      Self-Care   Self-Care  Other Self-Care Comments    Other Self-Care Comments   Discussed patients fall and how she could have handled situation differently.  Pt was coming out of door and holding onto storm door with one hand and turned to reach backward with other hand to shut main door behind her.  Discussed positioning her body and RW to hold storm door and turning herself and her walker to the side so she could safely reach door knob without letting go of walker completely.  Also discussed how pt could incorporate increased mobility into her day.  Pt does have breaks during the day where she can take walks indoors. Will discuss further on next visit.      Knee/Hip Exercises: Stretches   Passive Hamstring Stretch  Both;2 reps;30 seconds;Other (comment)   used balck theraband as strap   Passive Hamstring Stretch Limitations  Added to HEP     Piriformis Stretch  Right;2 reps;30 seconds    Piriformis Stretch Limitations  supine figure 4 stretch with assistance to maintain placement of R foot and to achieve optimal stretch.  Added to HEP.         PT Education - 08/05/18 1635    Education Details  Additions to HEP, adding walking program next visit    Person(s) Educated  Patient    Methods  Explanation;Demonstration;Verbal cues;Handout    Comprehension  Verbalized understanding       PT Short Term Goals - 07/13/18 2025      PT SHORT TERM GOAL #1   Title  Pt will be independent with initial HEP    Time  4    Period  Weeks    Status  New    Target Date  08/12/18      PT SHORT TERM GOAL #2   Title  Pt will improve gait velocity with LRAD to >/= 1.8 ft/sec    Baseline  1.29 ft/sec with cane    Time  4    Period  Weeks    Status  New    Target Date  08/12/18      PT SHORT TERM GOAL #3   Title  Pt will improve five time sit to stand to </= 15 seconds without use of UE and LE pushing back on mat    Baseline  19 seconds    Time  4    Period  Weeks    Status  New    Target Date  08/12/18      PT SHORT TERM GOAL #4   Title  Pt will improve BERG to >/= 43/56    Baseline  39/56    Time  4    Period  Weeks    Status  New    Target Date  08/12/18      PT SHORT TERM GOAL #5   Title  Pt will report being able to stand at her standing desk at work for 10-12 minutes at a time with pain </= 4/10    Time  4    Period  Weeks    Status  New    Target Date  08/12/18        PT Long Term Goals - 07/13/18 2028      PT LONG TERM GOAL #1   Title  patient to be independent with advanced HEP  Time  8    Period  Weeks    Status  Revised    Target Date  09/11/18      PT LONG TERM GOAL #2   Title  patient to improve gait speed to >/= 2.6 ft/sec with LRAD for improved functional mobility    Time  8    Period  Weeks    Status  Revised    Target Date  09/11/18      PT LONG TERM GOAL #3   Title  patient to improve 5x  sit to stand to </= 13 seconds without use of UE demonstrating improved mobility    Time  8    Period  Weeks    Status  Revised    Target Date  09/11/18      PT LONG TERM GOAL #4   Title  patient to improve Berg to >/= 46/56 demosntrating reduced fall risk    Time  8    Period  Weeks    Status  Revised    Target Date  09/11/18      PT LONG TERM GOAL #5   Title  patient to demonstrate reciprocal stair navigation up 1 flight with L rail and cane in RUE with supervision    Time  8    Period  Weeks    Status  New    Target Date  09/11/18      Additional Long Term Goals   Additional Long Term Goals  Yes      PT LONG TERM GOAL #6   Title  Pt will report being able to stand at her standing desk at work for 15-18 minutes at a time    Time  8    Period  Weeks    Status  New    Target Date  09/11/18            Plan - 08/05/18 1637    Clinical Impression Statement  Pt with less c/o pain upon arrival and during session today.  Session focused on transfers, stretching and discussing fall/safety issues.  Pt does not have a walking program or routine and reports that she feels better when she moves more.  Will add walking program next visit.  Continue PT per POC.    Rehab Potential  Good    Clinical Impairments Affecting Rehab Potential  HTN    PT Frequency  2x / week    PT Duration  8 weeks    PT Treatment/Interventions  ADLs/Self Care Home Management;Cryotherapy;Moist Heat;Therapeutic exercise;Therapeutic activities;Functional mobility training;Stair training;Gait training;DME Instruction;Balance training;Neuromuscular re-education;Patient/family education;Manual techniques;Passive range of motion;Dry needling;Taping    PT Next Visit Plan  Try 3 or 6 MWT to develop walking program for home (pt has breaks at work where she can walk indoors).  Follow up on supine piriformis and hamstring stretches that were added to HEP.  Step ups/down to work on Psychologist, counselling.  SLS activities.   Would she be open to TDN of piriformis?      PT Home Exercise Plan   Access Code: [email or web address]     Consulted and Agree with Plan of Care  Patient       Patient will benefit from skilled therapeutic intervention in order to improve the following deficits and impairments:  Abnormal gait, Decreased activity tolerance, Decreased balance, Decreased mobility, Difficulty walking, Decreased strength, Impaired flexibility, Pain  Visit Diagnosis: Other abnormalities of gait and mobility  Muscle weakness (generalized)  Unsteadiness on feet  Problem List Patient Active Problem List   Diagnosis Date Noted  . Greater trochanteric pain syndrome 06/26/2018  . Nondisplaced fracture of navicular bone of left foot with routine healing 12/21/2016  . Closed displaced fracture of navicular bone of left foot 12/12/2016  . Essential hypertension, benign 05/06/2016  . Left Frontal-Parietal CVA 04/11/2016  . ST elevation - in setting of CVA & HTN Emergency - NOT MI 04/11/2016  . Multiple vessel coronary artery disease 04/11/2016  . Diabetes mellitus type 2, insulin dependent (HCC) 04/11/2016  . Acute respiratory failure with hypoxia (HCC) 04/11/2016  . Hyperlipidemia 04/11/2016  . Acute pulmonary edema (HCC)   . Hypertensive emergency   . Acute appendicitis 02/29/2016    Newell Coralenise Terry Raihana Balderrama, PTA [facility] 08/05/18 4:41 PM Phone: [phone] Fax: [phone]   [facility] [address] [place], [place], [address] Phone: [phone]   Fax:  [phone]  Name: Rebecca Wagner MRN: [id number] Date of Birth: [birth year]

## 2018-08-06 ENCOUNTER — Ambulatory Visit: Payer: 59 | Admitting: Physical Therapy

## 2018-08-06 ENCOUNTER — Encounter: Payer: Self-pay | Admitting: Physical Therapy

## 2018-08-06 DIAGNOSIS — R2681 Unsteadiness on feet: Secondary | ICD-10-CM

## 2018-08-06 DIAGNOSIS — R2689 Other abnormalities of gait and mobility: Secondary | ICD-10-CM

## 2018-08-06 DIAGNOSIS — M6281 Muscle weakness (generalized): Secondary | ICD-10-CM | POA: Diagnosis not present

## 2018-08-06 DIAGNOSIS — G8929 Other chronic pain: Secondary | ICD-10-CM | POA: Diagnosis not present

## 2018-08-06 DIAGNOSIS — M25551 Pain in right hip: Secondary | ICD-10-CM

## 2018-08-06 DIAGNOSIS — M5441 Lumbago with sciatica, right side: Secondary | ICD-10-CM | POA: Diagnosis not present

## 2018-08-06 NOTE — Telephone Encounter (Signed)
Called pt, VM full, unable to leave VM.

## 2018-08-06 NOTE — Patient Instructions (Signed)
WALKING  Walking is a great form of exercise to increase your strength, endurance and overall fitness.  A walking program can help you start slowly and gradually build endurance as you go.  Everyone's ability is different, so each person's starting point will be different.  You do not have to follow them exactly.  The are just samples. You should simply find out what's right for you and stick to that program.   In the beginning, you'll start off walking 2 times a day for short distances.  As you get stronger, you'll be walking further at just 1-2 times per day.  A. You Can Walk For A Certain Length Of Time Each Day    Walk 3 minutes 2 times per day.  Increase 2-3 minutes every 7 days.  Work up to 25-30 minutes (1-2 times per day).    Use your walker for balance.

## 2018-08-08 ENCOUNTER — Encounter: Payer: Self-pay | Admitting: Sports Medicine

## 2018-08-10 NOTE — Therapy (Signed)
[facility] [address] [place], [place], [address] Phone: [phone]   Fax:  [phone]  Physical Therapy Treatment  Patient Details  Name: Rebecca Wagner MRN: [id number] Date of Birth: 04/05/1961 Referring Provider (PT): Andrena Mews, DO   Encounter Date: 08/06/2018   08/06/18 1538  PT Visits / Re-Eval  Visit Number 9  Number of Visits 21  Date for PT Re-Evaluation 09/11/18  Authorization  Authorization Type [facility] - 10th visit PN  PT Time Calculation  PT Start Time 1534  PT Stop Time 1615  PT Time Calculation (min) 41 min  PT - End of Session  Activity Tolerance Patient tolerated treatment well  Behavior During Therapy [facility] for tasks assessed/performed     Past Medical History:  Diagnosis Date  . Arthritis   . Depression   . Diabetes mellitus without complication (HCC)   . Hypertension     Past Surgical History:  Procedure Laterality Date  . CARDIAC CATHETERIZATION N/A 04/10/2016   Procedure: Left Heart Cath and Coronary Angiography;  Surgeon: Iran Ouch, MD;  [facility];  Service: Cardiovascular;  Laterality: N/A;  . CESAREAN SECTION    . LAPAROSCOPIC APPENDECTOMY N/A 02/29/2016   Procedure: LAPAROSCOPIC APPENDECTOMY;  Surgeon: Axel Filler, MD;  Location: [facility] OR;  Service: General;  Laterality: N/A;    There were no vitals filed for this visit.     08/06/18 1536  Symptoms/Limitations  Subjective No new compliants. No new falls.   Pertinent History CVA, HTN, DM, arthritis  Limitations Walking;Standing  Patient Stated Goals deal with leg pain  Pain Assessment  Currently in Pain? Yes  Pain Score 3  Pain Location Generalized (bil LE's knees down to calves)  Pain Orientation Right;Left  Pain Descriptors / Indicators Tightness;Other (Comment) ("pulling")  Pain Onset More than a month ago  Aggravating Factors  1st thing in the morning, weather  Pain Relieving  Factors Flexaryl, moving around      08/06/18 0001  Self-Care  Self-Care Other Self-Care Comments  Other Self-Care Comments  Reviewed concepts of dry needling for pain and tightness. Pt continues to want this to be a "last resort" intervention.   Knee/Hip Exercises: Stretches  Passive Hamstring Stretch Right;2 reps;60 seconds;Limitations  Passive Hamstring Stretch Limitations PTA passively performing stretch  Piriformis Stretch Right;2 reps;60 seconds;Limitations  Piriformis Stretch Limitations reviewed with pt performing stretch from HEP.  Active Hamstring Stretch Right;1 rep;60 seconds  Active Hamstring Stretch Limitations with strap at edge of mat       08/06/18 1538  6 Minute Walk- Baseline  6 Minute Walk- Baseline y  HR (bpm) 70  02 Sat (%RA) 96 %  Modified Borg Scale for Dyspnea 0- Nothing at all  Perceived Rate of Exertion (Borg) 6-  6 Minute walk- Post Test  6 Minute Walk Post Test y  HR (bpm) 76  02 Sat (%RA) 100 %  Modified Borg Scale for Dyspnea 0- Nothing at all  Perceived Rate of Exertion (Borg) 7- Very, very light  6 minute walk test results   Aerobic Endurance Distance Walked 186  Endurance additional comments 3 minute walk test with RW. no rest breaks. left foot scuffing x 3 episodes with gait. min guard assist for balance. added walking program to HEP.        08/06/18 1730  PT Education  Education Details added walking program to HEP  Person(s) Educated Patient  Methods Explanation;Demonstration;Verbal cues;Handout  Comprehension Verbalized understanding;Returned demonstration;Verbal  cues required;Tactile cues required       PT Short Term Goals - 07/13/18 2025      PT SHORT TERM GOAL #1   Title  Pt will be independent with initial HEP    Time  4    Period  Weeks    Status  New    Target Date  08/12/18      PT SHORT TERM GOAL #2   Title  Pt will improve gait velocity with LRAD to >/= 1.8 ft/sec    Baseline  1.29 ft/sec with cane    Time  4     Period  Weeks    Status  New    Target Date  08/12/18      PT SHORT TERM GOAL #3   Title  Pt will improve five time sit to stand to </= 15 seconds without use of UE and LE pushing back on mat    Baseline  19 seconds    Time  4    Period  Weeks    Status  New    Target Date  08/12/18      PT SHORT TERM GOAL #4   Title  Pt will improve BERG to >/= 43/56    Baseline  39/56    Time  4    Period  Weeks    Status  New    Target Date  08/12/18      PT SHORT TERM GOAL #5   Title  Pt will report being able to stand at her standing desk at work for 10-12 minutes at a time with pain </= 4/10    Time  4    Period  Weeks    Status  New    Target Date  08/12/18        PT Long Term Goals - 07/13/18 2028      PT LONG TERM GOAL #1   Title  patient to be independent with advanced HEP     Time  8    Period  Weeks    Status  Revised    Target Date  09/11/18      PT LONG TERM GOAL #2   Title  patient to improve gait speed to >/= 2.6 ft/sec with LRAD for improved functional mobility    Time  8    Period  Weeks    Status  Revised    Target Date  09/11/18      PT LONG TERM GOAL #3   Title  patient to improve 5x sit to stand to </= 13 seconds without use of UE demonstrating improved mobility    Time  8    Period  Weeks    Status  Revised    Target Date  09/11/18      PT LONG TERM GOAL #4   Title  patient to improve Berg to >/= 46/56 demosntrating reduced fall risk    Time  8    Period  Weeks    Status  Revised    Target Date  09/11/18      PT LONG TERM GOAL #5   Title  patient to demonstrate reciprocal stair navigation up 1 flight with L rail and cane in RUE with supervision    Time  8    Period  Weeks    Status  New    Target Date  09/11/18      Additional Long Term Goals   Additional Long Term Goals  Yes      PT LONG TERM GOAL #6   Title  Pt will report being able to stand at her standing desk at work for 15-18 minutes at a time    Time  8    Period  Weeks     Status  New    Target Date  09/11/18         08/06/18 1538  Plan  Clinical Impression Statement Today's skilled session initially focused on performance of 3 minute walk test and addition of a walking program to pt's HEP. Also reviewed stretches added at last session with cues on correct technique needed. Primary PT addressed pt questions on dry needlin with pt stating she wants this to remain a last resort intervention.   Pt will benefit from skilled therapeutic intervention in order to improve on the following deficits Abnormal gait;Decreased activity tolerance;Decreased balance;Decreased mobility;Difficulty walking;Decreased strength;Impaired flexibility;Pain  Rehab Potential Good  Clinical Impairments Affecting Rehab Potential HTN  PT Frequency 2x / week  PT Duration 8 weeks  PT Treatment/Interventions ADLs/Self Care Home Management;Cryotherapy;Moist Heat;Therapeutic exercise;Therapeutic activities;Functional mobility training;Stair training;Gait training;DME Instruction;Balance training;Neuromuscular re-education;Patient/family education;Manual techniques;Passive range of motion;Dry needling;Taping  PT Next Visit Plan 10th visit progress note next session; continue to work on LE/core stretching, strengthening, step ups/downs to work on Psychologist, counselling. How is the walking program going?  PT Home Exercise Plan  Access Code: [email or web address]   Consulted and Agree with Plan of Care Patient          Patient will benefit from skilled therapeutic intervention in order to improve the following deficits and impairments:  Abnormal gait, Decreased activity tolerance, Decreased balance, Decreased mobility, Difficulty walking, Decreased strength, Impaired flexibility, Pain  Visit Diagnosis: Other abnormalities of gait and mobility  Muscle weakness (generalized)  Unsteadiness on feet  Pain in right hip     Problem List Patient Active Problem List   Diagnosis Date Noted  . Greater  trochanteric pain syndrome 06/26/2018  . Nondisplaced fracture of navicular bone of left foot with routine healing 12/21/2016  . Closed displaced fracture of navicular bone of left foot 12/12/2016  . Essential hypertension, benign 05/06/2016  . Left Frontal-Parietal CVA 04/11/2016  . ST elevation - in setting of CVA & HTN Emergency - NOT MI 04/11/2016  . Multiple vessel coronary artery disease 04/11/2016  . Diabetes mellitus type 2, insulin dependent (HCC) 04/11/2016  . Acute respiratory failure with hypoxia (HCC) 04/11/2016  . Hyperlipidemia 04/11/2016  . Acute pulmonary edema (HCC)   . Hypertensive emergency   . Acute appendicitis 02/29/2016    Sallyanne Kuster, PTA, [facility] [address] [place], [place] [address] [phone] 08/10/18, 8:26 AM   Name: MALARY MABIN MRN: [id number] Date of Birth: 1961-01-12

## 2018-08-12 ENCOUNTER — Ambulatory Visit: Payer: 59 | Admitting: Physical Therapy

## 2018-08-12 ENCOUNTER — Encounter: Payer: Self-pay | Admitting: Physical Therapy

## 2018-08-12 DIAGNOSIS — M25551 Pain in right hip: Secondary | ICD-10-CM | POA: Diagnosis not present

## 2018-08-12 DIAGNOSIS — R2689 Other abnormalities of gait and mobility: Secondary | ICD-10-CM

## 2018-08-12 DIAGNOSIS — R2681 Unsteadiness on feet: Secondary | ICD-10-CM

## 2018-08-12 DIAGNOSIS — M5441 Lumbago with sciatica, right side: Secondary | ICD-10-CM | POA: Diagnosis not present

## 2018-08-12 DIAGNOSIS — G8929 Other chronic pain: Secondary | ICD-10-CM | POA: Diagnosis not present

## 2018-08-12 DIAGNOSIS — M6281 Muscle weakness (generalized): Secondary | ICD-10-CM | POA: Diagnosis not present

## 2018-08-12 NOTE — Therapy (Addendum)
[facility] [address] [place], [place], [address] Phone: [phone]   Fax:  [phone]  Physical Therapy Treatment and 10th visit Progress Note  Patient Details  Name: Rebecca Wagner MRN: [id number] Date of Birth: [birth year] Referring Provider (PT): Gerda Diss, DO   Encounter Date: 08/12/2018  PT End of Session - 08/12/18 1637    Visit Number  10    Number of Visits  21    Date for PT Re-Evaluation  09/11/18    Authorization Type  [facility] - 10th visit PN    PT Start Time  1535    PT Stop Time  1620    PT Time Calculation (min)  45 min    Activity Tolerance  Patient tolerated treatment well    Behavior During Therapy  [facility] for tasks assessed/performed       Past Medical History:  Diagnosis Date  . Arthritis   . Depression   . Diabetes mellitus without complication ([place])   . Hypertension     Past Surgical History:  Procedure Laterality Date  . CARDIAC CATHETERIZATION N/A 04/10/2016   Procedure: Left Heart Cath and Coronary Angiography;  Surgeon: Wellington Hampshire, MD;  Location: [facility];  Service: Cardiovascular;  Laterality: N/A;  . CESAREAN SECTION    . LAPAROSCOPIC APPENDECTOMY N/A 02/29/2016   Procedure: LAPAROSCOPIC APPENDECTOMY;  Surgeon: Ralene Ok, MD;  Location: [facility];  Service: General;  Laterality: N/A;    There were no vitals filed for this visit.  Subjective Assessment - 08/12/18 1544    Subjective  No new falls.  Reports her standing desk is heavy because she has 2 screens on it an keyboard.  She has to lift it from the seated to standing position and needs help to raise it.  She is unable to raise by herself.    Pertinent History  CVA, HTN, DM, arthritis    Limitations  Walking;Standing    Patient Stated Goals  deal with leg pain    Currently in Pain?  Yes    Pain Score  2     Pain Location  Leg   hips to calves   Pain Orientation  Right;Left    Pain Descriptors  / Indicators  Tightness   pulling   Pain Type  Chronic pain    Pain Onset  More than a month ago    Aggravating Factors   leaning forward, weather    Pain Relieving Factors  biofreeze         [facility] Adult PT Treatment/Exercise - 08/12/18 0001      Transfers   Transfers  Sit to Stand;Stand to Sit    Sit to Stand  6: Modified independent (Device/Increase time)    Five time sit to stand comments   16.87 seconds from mat with UE crosses.  Pt did have 1 bout uncontrolled descent and leans with backs of legs against mat.    Stand to Sit  6: Modified independent (Device/Increase time)    Number of Reps  Other reps (comment)   5 reps x 5 sets during session   Transfer Cueing  pt reluctant to try sit<>stand without UE support      Ambulation/Gait   Ambulation/Gait  Yes    Ambulation/Gait Assistance  5: Supervision    Ambulation/Gait Assistance Details  Pt continues to use RW instead of quad cane due to hip pain.  States she wants to return to quad cane but  has gotten reliant on RW.    Ambulation Distance (Feet)  100 Feet   x 2 plus during gym activities   Assistive device  Rolling walker    Gait Pattern  Step-to pattern;Decreased step length - right;Decreased step length - left;Decreased stride length;Shuffle;Trunk flexed;Poor foot clearance - left;Poor foot clearance - right    Ambulation Surface  Level;Indoor    Gait velocity  2.34 ft/sec   14 seconds for 35mwith RW     Standardized Balance Assessment   Standardized Balance Assessment  Berg Balance Test      Berg Balance Test   Sit to Stand  Able to stand without using hands and stabilize independently    Standing Unsupported  Able to stand safely 2 minutes    Sitting with Back Unsupported but Feet Supported on Floor or Stool  Able to sit safely and securely 2 minutes    Stand to Sit  Sits safely with minimal use of hands    Transfers  Able to transfer safely, minor use of hands    Standing Unsupported with Eyes Closed  Able to  stand 10 seconds safely    Standing Ubsupported with Feet Together  Able to place feet together independently and stand 1 minute safely    From Standing, Reach Forward with Outstretched Arm  Can reach confidently >25 cm (10")    From Standing Position, Pick up Object from Floor  Able to pick up shoe, needs supervision    From Standing Position, Turn to Look Behind Over each Shoulder  Looks behind from both sides and weight shifts well    Turn 360 Degrees  Able to turn 360 degrees safely but slowly    Standing Unsupported, Alternately Place Feet on Step/Stool  Able to complete >2 steps/needs minimal assist    Standing Unsupported, One Foot in Front  Able to take small step independently and hold 30 seconds    Standing on One Leg  Tries to lift leg/unable to hold 3 seconds but remains standing independently    Total Score  45             PT Education - 08/12/18 1552    Education Details  progress toward goals    Person(s) Educated  Patient    Methods  Explanation    Comprehension  Verbalized understanding       PT Short Term Goals - 08/12/18 1634      PT SHORT TERM GOAL #1   Title  Pt will be independent with initial HEP    Time  4    Period  Weeks    Status  On-going    Target Date  08/12/18      PT SHORT TERM GOAL #2   Title  Pt will improve gait velocity with LRAD to >/= 1.8 ft/sec    Baseline  1.29 ft/sec with cane >> 2.34 ft/sec with RW    Time  4    Period  Weeks    Status  Partially Met   Met speed but now using RW (vs cane)   Target Date  08/12/18      PT SHORT TERM GOAL #3   Title  Pt will improve five time sit to stand to </= 15 seconds without use of UE and LE pushing back on mat    Baseline  19 seconds >> 16.87 seconds from mat without UE but pushes back with LE's on mat on 2/12    Time  4  Period  Weeks    Status  Partially Met    Target Date  08/12/18      PT SHORT TERM GOAL #4   Title  Pt will improve BERG to >/= 43/56    Baseline  39/56; 45/56 on  2/12    Time  4    Period  Weeks    Status  Achieved    Target Date  08/12/18      PT SHORT TERM GOAL #5   Title  Pt will report being able to stand at her standing desk at work for 10-12 minutes at a time with pain </= 4/10    Baseline  unmet per pt    Time  4    Period  Weeks    Status  Not Met    Target Date  08/12/18        PT Long Term Goals - 08/12/18 2134      PT LONG TERM GOAL #1   Title  patient to be independent with advanced HEP   (ALL LTG due by 09/11/18)    Time  8    Period  Weeks    Status  Revised      PT LONG TERM GOAL #2   Title  patient to improve gait speed to >/= 2.6 ft/sec with LRAD for improved functional mobility    Baseline  2.34 ft/sec with RW    Time  8    Period  Weeks    Status  Revised      PT LONG TERM GOAL #3   Title  patient to improve 5x sit to stand to </= 15 seconds without use of UE demonstrating improved mobility    Time  8    Period  Weeks    Status  Revised      PT LONG TERM GOAL #4   Title  patient to improve Berg to >/= 46/56 demosntrating reduced fall risk    Time  8    Period  Weeks    Status  Revised      PT LONG TERM GOAL #5   Title  patient to demonstrate reciprocal stair navigation up 1 flight with L rail and cane in RUE with supervision    Time  8    Period  Weeks    Status  New      PT LONG TERM GOAL #6   Title  Pt will report being able to stand at her standing desk at work for 8-10 minutes at a time    Time  8    Period  Weeks    Status  Revised            Plan - 08/12/18 1637    Clinical Impression Statement  Pt met STG #4.  STG's 3 and 5 unmet.  STG #2 partially met.  Pt is now using RW vs cane upon initial evaluation due to pt having increased pain.  Pt very fearful of falling.  Continue PT per POC.    Rehab Potential  Good    Clinical Impairments Affecting Rehab Potential  HTN    PT Frequency  2x / week    PT Duration  8 weeks    PT Treatment/Interventions  ADLs/Self Care Home  Management;Cryotherapy;Moist Heat;Therapeutic exercise;Therapeutic activities;Functional mobility training;Stair training;Gait training;DME Instruction;Balance training;Neuromuscular re-education;Patient/family education;Manual techniques;Passive range of motion;Dry needling;Taping    PT Next Visit Plan  Finish checking STG's.  Continue to work on LE/core stretching, strengthening, step  ups/downs to work on Industrial/product designer.  Gait with quad cane again.    PT Home Exercise Plan   Access Code: [email or web address]     Consulted and Agree with Plan of Care  Patient       Patient will benefit from skilled therapeutic intervention in order to improve the following deficits and impairments:  Abnormal gait, Decreased activity tolerance, Decreased balance, Decreased mobility, Difficulty walking, Decreased strength, Impaired flexibility, Pain  Visit Diagnosis: Other abnormalities of gait and mobility  Muscle weakness (generalized)  Unsteadiness on feet  Pain in right hip     Problem List Patient Active Problem List   Diagnosis Date Noted  . Greater trochanteric pain syndrome 06/26/2018  . Nondisplaced fracture of navicular bone of left foot with routine healing 12/21/2016  . Closed displaced fracture of navicular bone of left foot 12/12/2016  . Essential hypertension, benign 05/06/2016  . Left Frontal-Parietal CVA 04/11/2016  . ST elevation - in setting of CVA & HTN Emergency - NOT MI 04/11/2016  . Multiple vessel coronary artery disease 04/11/2016  . Diabetes mellitus type 2, insulin dependent ([place]) 04/11/2016  . Acute respiratory failure with hypoxia ([place]) 04/11/2016  . Hyperlipidemia 04/11/2016  . Acute pulmonary edema (HCC)   . Hypertensive emergency   . Acute appendicitis 02/29/2016    Narda Bonds, PTA [facility] 08/12/18 4:40 PM Phone: [phone] Fax: [phone]  10th Visit Physical Therapy Progress Note  Dates of Reporting Period:  05/15/18 to 08/12/2018  Objective Reports: See above  Objective Measurements: see above  Goal Update: see STG above; LTG revised today  Plan: Continue with POC with continued emphasis on community wellness  Reason Skilled Services are Required: to continue to address pain, impairments in core and LE strength, endurance, balance and gait.  Rico Junker, PT, DPT 08/12/18    9:37 PM     [facility] [address] [place], [place], [address] Phone: [phone]   Fax:  [phone]  Name: LACINDA CURVIN MRN: [id number] Date of Birth: [birth year]

## 2018-08-14 ENCOUNTER — Ambulatory Visit: Payer: 59 | Admitting: Physical Therapy

## 2018-08-14 MED FILL — HUMALOG 100 UNITS/ML KWIKPE: 100 | 30 days supply | Qty: 9 | Fill #0

## 2018-08-14 MED FILL — LANTUS SOLOSTAR 100 UNITS/M: 100 | 38 days supply | Qty: 15 | Fill #3

## 2018-08-14 MED FILL — PRAVASTATIN SODIUM 20 MG TA: 20 | 30 days supply | Qty: 30 | Fill #3

## 2018-08-17 ENCOUNTER — Ambulatory Visit: Payer: 59 | Admitting: Physical Therapy

## 2018-08-17 ENCOUNTER — Encounter: Payer: Self-pay | Admitting: Physical Therapy

## 2018-08-17 DIAGNOSIS — R2681 Unsteadiness on feet: Secondary | ICD-10-CM

## 2018-08-17 DIAGNOSIS — M6281 Muscle weakness (generalized): Secondary | ICD-10-CM

## 2018-08-17 DIAGNOSIS — M5441 Lumbago with sciatica, right side: Secondary | ICD-10-CM

## 2018-08-17 DIAGNOSIS — G8929 Other chronic pain: Secondary | ICD-10-CM

## 2018-08-17 DIAGNOSIS — R2689 Other abnormalities of gait and mobility: Secondary | ICD-10-CM | POA: Diagnosis not present

## 2018-08-17 DIAGNOSIS — M25551 Pain in right hip: Secondary | ICD-10-CM | POA: Diagnosis not present

## 2018-08-17 NOTE — Patient Instructions (Signed)
Access Code: [email or web address]  URL: [email or web address]  Date: 08/17/2018  Prepared by: Sallyanne Kuster   Exercises  Sit to Stand without Arm Support - 10 reps - 3 sets - 2x daily - 7x weekly  Standing Tandem Balance with Counter Support - 2 sets - 10 hold - 1x daily - 7x weekly  Single Leg Stance with Support - 3 sets - 10 hold - 1x daily - 7x weekly  Standing Hip Abduction with Counter Support - 10 reps - 2 sets - 1x daily - 7x weekly  Backward Walking with Counter Support - 5 reps - 1x daily - 7x weekly  Walking March - 4 reps - 1 sets - 1x daily - 5x weekly  Supine Hamstring Stretch with Strap - 3 sets - 30 seconds hold - 2x daily - 7x weekly  Tandem Walking with Counter Support - 4 reps - 1 sets - 1x daily - 5x weekly  Supine Figure 4 Piriformis Stretch - 3 sets - 30 seconds hold - 2x daily - 7x weekly

## 2018-08-17 NOTE — Telephone Encounter (Signed)
Pt has OV 09/03/18, will discuss at that time.

## 2018-08-18 NOTE — Therapy (Signed)
[facility] [address] [place], [place], [address] Phone: [phone]   Fax:  [phone]  Physical Therapy Treatment  Patient Details  Name: Rebecca Wagner MRN: [id number] Date of Birth: July 08, 1960 Referring Provider (PT): Gerda Diss, DO   Encounter Date: 08/17/2018  PT End of Session - 08/17/18 1537    Visit Number  11    Number of Visits  21    Date for PT Re-Evaluation  09/11/18    Authorization Type  [facility] - 10th visit PN    PT Start Time  6314    PT Stop Time  1615    PT Time Calculation (min)  42 min    Equipment Utilized During Treatment  Gait belt    Activity Tolerance  Patient tolerated treatment well    Behavior During Therapy  [facility] for tasks assessed/performed       Past Medical History:  Diagnosis Date  . Arthritis   . Depression   . Diabetes mellitus without complication ([place])   . Hypertension     Past Surgical History:  Procedure Laterality Date  . CARDIAC CATHETERIZATION N/A 04/10/2016   Procedure: Left Heart Cath and Coronary Angiography;  Surgeon: Wellington Hampshire, MD;  Location: [facility];  Service: Cardiovascular;  Laterality: N/A;  . CESAREAN SECTION    . LAPAROSCOPIC APPENDECTOMY N/A 02/29/2016   Procedure: LAPAROSCOPIC APPENDECTOMY;  Surgeon: Ralene Ok, MD;  Location: [facility];  Service: General;  Laterality: N/A;    There were no vitals filed for this visit.  Subjective Assessment - 08/17/18 1535    Subjective  Has not been standing at her standing desk due to difficulty with getting it up. Has been walking at work with walker.     Pertinent History  CVA, HTN, DM, arthritis    Limitations  Walking;Standing    Patient Stated Goals  deal with leg pain    Currently in Pain?  Yes    Pain Score  1     Pain Location  Leg   thighs down    Pain Orientation  Right;Left    Pain Descriptors / Indicators  Aching   "pulling"   Pain Type  Chronic pain    Pain Onset   More than a month ago    Aggravating Factors   leaning forward, cold weather    Pain Relieving Factors  biofreeze          [facility] Adult PT Treatment/Exercise - 08/17/18 1610      Transfers   Transfers  Sit to Stand;Stand to Sit    Sit to Stand  6: Modified independent (Device/Increase time);With upper extremity assist;From bed;From chair/3-in-1    Stand to Sit  6: Modified independent (Device/Increase time);With upper extremity assist;To bed;To chair/3-in-1      Ambulation/Gait   Ambulation/Gait  Yes    Ambulation/Gait Assistance  5: Supervision    Ambulation/Gait Assistance Details  cues on posture and for decreased UE support on walker     Ambulation Distance (Feet)  --   in/out of/around gym with activity   Assistive device  Rolling walker    Gait Pattern  Step-to pattern;Decreased step length - right;Decreased step length - left;Decreased stride length;Shuffle;Trunk flexed;Poor foot clearance - left;Poor foot clearance - right    Ambulation Surface  Level;Indoor      Neuro Re-ed    Neuro Re-ed Details   for balance/coordination/muscle re-ed: reviewed balance compontents of HEP. cues on ex form/technique.  min guard assist  for balance. added fwd marching and fwd walking tandem to HEP today after performance in session.       Exercises   Exercises  Other Exercises    Other Exercises   reviewed HEP. cues on hold times with stretches and ex form/technqiue with ex's.         Access Code: [email or web address]  URL: [email or web address]  Date: 08/17/2018  Prepared by: Willow Ora   Exercises  Sit to Stand without Arm Support - 10 reps - 3 sets - 2x daily - 7x weekly  Standing Tandem Balance with Counter Support - 2 sets - 10 hold - 1x daily - 7x weekly  Single Leg Stance with Support - 3 sets - 10 hold - 1x daily - 7x weekly  Standing Hip Abduction with Counter Support - 10 reps - 2 sets - 1x daily - 7x weekly  Backward Walking with Counter Support - 5 reps - 1x daily - 7x  weekly  Walking March - 4 reps - 1 sets - 1x daily - 5x weekly  Supine Hamstring Stretch with Strap - 3 sets - 30 seconds hold - 2x daily - 7x weekly  Tandem Walking with Counter Support - 4 reps - 1 sets - 1x daily - 5x weekly  Supine Figure 4 Piriformis Stretch - 3 sets - 30 seconds hold - 2x daily - 7x weekly    PT Education - 08/17/18 1608    Education Details  reviewed and updated HEP    Person(s) Educated  Patient    Methods  Explanation;Demonstration;Verbal cues;Handout    Comprehension  Verbalized understanding;Returned demonstration;Verbal cues required;Tactile cues required;Need further instruction       PT Short Term Goals - 08/17/18 2123      PT SHORT TERM GOAL #1   Title  Pt will be independent with initial HEP    Baseline  08/17/18: met today with current program. advanced balance components.     Status  Achieved    Target Date  08/12/18      PT SHORT TERM GOAL #2   Title  Pt will improve gait velocity with LRAD to >/= 1.8 ft/sec    Baseline  1.29 ft/sec with cane >> 2.34 ft/sec with RW    Time  4    Period  Weeks    Status  Partially Met   Met speed but now using RW (vs cane)   Target Date  08/12/18      PT SHORT TERM GOAL #3   Title  Pt will improve five time sit to stand to </= 15 seconds without use of UE and LE pushing back on mat    Baseline  19 seconds >> 16.87 seconds from mat without UE but pushes back with LE's on mat on 2/12    Time  4    Period  Weeks    Status  Partially Met    Target Date  08/12/18      PT SHORT TERM GOAL #4   Title  Pt will improve BERG to >/= 43/56    Baseline  39/56; 45/56 on 2/12    Time  4    Period  Weeks    Status  Achieved    Target Date  08/12/18      PT SHORT TERM GOAL #5   Title  Pt will report being able to stand at her standing desk at work for 10-12 minutes at a time with pain </= 4/10  Baseline  unmet per pt    Time  4    Period  Weeks    Status  Not Met    Target Date  08/12/18        PT Long Term  Goals - 08/12/18 2134      PT LONG TERM GOAL #1   Title  patient to be independent with advanced HEP   (ALL LTG due by 09/11/18)    Time  8    Period  Weeks    Status  Revised      PT LONG TERM GOAL #2   Title  patient to improve gait speed to >/= 2.6 ft/sec with LRAD for improved functional mobility    Baseline  2.34 ft/sec with RW    Time  8    Period  Weeks    Status  Revised      PT LONG TERM GOAL #3   Title  patient to improve 5x sit to stand to </= 15 seconds without use of UE demonstrating improved mobility    Time  8    Period  Weeks    Status  Revised      PT LONG TERM GOAL #4   Title  patient to improve Berg to >/= 46/56 demosntrating reduced fall risk    Time  8    Period  Weeks    Status  Revised      PT LONG TERM GOAL #5   Title  patient to demonstrate reciprocal stair navigation up 1 flight with L rail and cane in RUE with supervision    Time  8    Period  Weeks    Status  New      PT LONG TERM GOAL #6   Title  Pt will report being able to stand at her standing desk at work for 8-10 minutes at a time    Time  8    Period  Weeks    Status  Revised            Plan - 08/17/18 1538    Clinical Impression Statement  Today's skilled session focused on review of current HEP and advancement of the program with remaining STG met today. The pt is making slow, steady progress and should benefit from continued PT to progress toward unmet goals.     Rehab Potential  Good    Clinical Impairments Affecting Rehab Potential  HTN    PT Frequency  2x / week    PT Duration  8 weeks    PT Treatment/Interventions  ADLs/Self Care Home Management;Cryotherapy;Moist Heat;Therapeutic exercise;Therapeutic activities;Functional mobility training;Stair training;Gait training;DME Instruction;Balance training;Neuromuscular re-education;Patient/family education;Manual techniques;Passive range of motion;Dry needling;Taping    PT Next Visit Plan  Continue to work on LE/core stretching,  strengthening, step ups/downs to work on Industrial/product designer.  Gait with quad cane again.    PT Home Exercise Plan   Access Code: [email or web address]     Consulted and Agree with Plan of Care  Patient       Patient will benefit from skilled therapeutic intervention in order to improve the following deficits and impairments:  Abnormal gait, Decreased activity tolerance, Decreased balance, Decreased mobility, Difficulty walking, Decreased strength, Impaired flexibility, Pain  Visit Diagnosis: Other abnormalities of gait and mobility  Muscle weakness (generalized)  Unsteadiness on feet  Chronic bilateral low back pain with right-sided sciatica     Problem List Patient Active Problem List   Diagnosis Date Noted  . Greater trochanteric  pain syndrome 06/26/2018  . Nondisplaced fracture of navicular bone of left foot with routine healing 12/21/2016  . Closed displaced fracture of navicular bone of left foot 12/12/2016  . Essential hypertension, benign 05/06/2016  . Left Frontal-Parietal CVA 04/11/2016  . ST elevation - in setting of CVA & HTN Emergency - NOT MI 04/11/2016  . Multiple vessel coronary artery disease 04/11/2016  . Diabetes mellitus type 2, insulin dependent ([place]) 04/11/2016  . Acute respiratory failure with hypoxia ([place]) 04/11/2016  . Hyperlipidemia 04/11/2016  . Acute pulmonary edema (HCC)   . Hypertensive emergency   . Acute appendicitis 02/29/2016    Willow Ora, PTA, [facility] [address] [place], [place] [address] [phone] 08/18/18, 9:26 PM   Name: Rebecca Wagner MRN: [id number] Date of Birth: 02-Feb-1961

## 2018-08-19 ENCOUNTER — Ambulatory Visit: Payer: 59 | Admitting: Physical Therapy

## 2018-08-19 ENCOUNTER — Encounter: Payer: Self-pay | Admitting: Physical Therapy

## 2018-08-19 DIAGNOSIS — M6281 Muscle weakness (generalized): Secondary | ICD-10-CM

## 2018-08-19 DIAGNOSIS — G8929 Other chronic pain: Secondary | ICD-10-CM | POA: Diagnosis not present

## 2018-08-19 DIAGNOSIS — R2689 Other abnormalities of gait and mobility: Secondary | ICD-10-CM | POA: Diagnosis not present

## 2018-08-19 DIAGNOSIS — M25551 Pain in right hip: Secondary | ICD-10-CM | POA: Diagnosis not present

## 2018-08-19 DIAGNOSIS — R2681 Unsteadiness on feet: Secondary | ICD-10-CM

## 2018-08-19 DIAGNOSIS — M5441 Lumbago with sciatica, right side: Secondary | ICD-10-CM | POA: Diagnosis not present

## 2018-08-19 NOTE — Patient Instructions (Signed)
Access Code: [email or web address]  URL: [email or web address]  Date: 08/19/2018  Prepared by: Modena Morrow   Exercises Sit to Stand without Arm Support - 10 reps - 3 sets - 1x daily - 7x weekly Standing Tandem Balance with Counter Support - 2 sets - 10 hold                            - 1x daily - 7x weekly Single Leg Stance with Support - 3 sets - 10 hold - 1x daily - 7x weekly Standing Hip Abduction with Counter Support - 10 reps - 2 sets - 1x daily - 7x weekly Backward Walking with Counter Support - 5 reps - 1x daily - 7x weekly Walking March - 4 reps - 1 sets - 1x daily - 5x weekly Supine Hamstring Stretch with Strap - 3 sets - 30 seconds hold - 1x daily - 7x weekly Tandem Walking with Counter Support - 4 reps - 1 sets - 1x daily - 5x weekly Supine Figure 4 Piriformis Stretch - 3 sets - 30 seconds hold - 1x daily - 7x weekly Seated Ankle Dorsiflexion with Resistance - 10 reps - 2 sets - 2 seconds hold - 1x daily - 7x weekly

## 2018-08-19 NOTE — Therapy (Addendum)
[facility] [address] [place], [place], [address] Phone: [phone]   Fax:  [phone]  Physical Therapy Treatment  Patient Details  Name: Rebecca Wagner MRN: [id number] Date of Birth: 1960-08-13 Referring Provider (PT): Gerda Diss, DO   Encounter Date: 08/19/2018  PT End of Session - 08/19/18 1624    Visit Number  12    Number of Visits  21    Date for PT Re-Evaluation  09/11/18    Authorization Type  [facility] - 10th visit PN    PT Start Time  1532    PT Stop Time  1620    PT Time Calculation (min)  48 min    Equipment Utilized During Treatment  Gait belt    Activity Tolerance  Patient tolerated treatment well    Behavior During Therapy  [facility] for tasks assessed/performed       Past Medical History:  Diagnosis Date  . Arthritis   . Depression   . Diabetes mellitus without complication ([place])   . Hypertension     Past Surgical History:  Procedure Laterality Date  . CARDIAC CATHETERIZATION N/A 04/10/2016   Procedure: Left Heart Cath and Coronary Angiography;  Surgeon: Wellington Hampshire, MD;  Location: [place] [facility];  Service: Cardiovascular;  Laterality: N/A;  . CESAREAN SECTION    . LAPAROSCOPIC APPENDECTOMY N/A 02/29/2016   Procedure: LAPAROSCOPIC APPENDECTOMY;  Surgeon: Ralene Ok, MD;  Location: [place];  Service: General;  Laterality: N/A;    There were no vitals filed for this visit.  Subjective Assessment - 08/19/18 1620    Subjective  Denies falls or changes.  Still fearful of falling.  In breakroom at work she feels like her toes stick to the ground and makes her feel like shes going to fall.    Pertinent History  CVA, HTN, DM, arthritis    Limitations  Walking;Standing    Patient Stated Goals  deal with leg pain    Currently in Pain?  No/denies    Pain Onset  More than a month ago        [facility] Adult PT Treatment/Exercise - 08/19/18 0001      Transfers   Transfers  Sit to  Stand;Stand to Sit    Sit to Stand  6: Modified independent (Device/Increase time)    Stand to Sit  6: Modified independent (Device/Increase time)    Number of Reps  Other reps (comment)   5 reps     Ambulation/Gait   Ambulation/Gait  Yes    Ambulation/Gait Assistance  5: Supervision    Ambulation/Gait Assistance Details  decreased cadence-no LOB    Ambulation Distance (Feet)  330 Feet   x 1 and 110' x 3   Assistive device  Rolling walker;Straight cane   trialed SPC during session but pt using RW otherwise   Gait Pattern  Step-to pattern;Decreased step length - right;Decreased step length - left;Decreased stride length;Shuffle;Trunk flexed;Poor foot clearance - left;Poor foot clearance - right    Ambulation Surface  Level;Indoor    Stairs  Yes    Stairs Assistance  4: Min guard    Stair Management Technique  Two rails;Step to pattern;Forwards    Number of Stairs  8    Height of Stairs  6      High Level Balance   High Level Balance Activities  Marching forwards;Marching backwards;Other (comment)   with UE support of counter and [facility]   High Level Balance Comments  Taps to 6" step with bil UE support alternating LE's x 15 reps each.  Stepping onto/off of 6" step x 10 reps with RLE leading then LLE leading.  Pt fearful with activity but no knee buckling.      Exercises   Exercises  Ankle      Ankle Exercises: Standing   Heel Raises  Both;10 reps;2 seconds    Toe Raise  Other (comment)   unable to perform in standing     Ankle Exercises: Seated   Toe Raise  10 reps;2 seconds;Other (comment)   with red theraband for resistance;added to HEP       PT Education - 08/19/18 1624    Education Details  addition of Ankle dorsiflexion with resistance to HEP, continuing to use RW until we clear to use [facility]    Person(s) Educated  Patient    Methods  Explanation;Demonstration;Verbal cues;Handout    Comprehension  Verbalized understanding;Returned demonstration       PT Short Term Goals  - 08/17/18 2123      PT SHORT TERM GOAL #1   Title  Pt will be independent with initial HEP    Baseline  08/17/18: met today with current program. advanced balance components.     Status  Achieved    Target Date  08/12/18      PT SHORT TERM GOAL #2   Title  Pt will improve gait velocity with LRAD to >/= 1.8 ft/sec    Baseline  1.29 ft/sec with cane >> 2.34 ft/sec with RW    Time  4    Period  Weeks    Status  Partially Met   Met speed but now using RW (vs cane)   Target Date  08/12/18      PT SHORT TERM GOAL #3   Title  Pt will improve five time sit to stand to </= 15 seconds without use of UE and LE pushing back on mat    Baseline  19 seconds >> 16.87 seconds from mat without UE but pushes back with LE's on mat on 2/12    Time  4    Period  Weeks    Status  Partially Met    Target Date  08/12/18      PT SHORT TERM GOAL #4   Title  Pt will improve BERG to >/= 43/56    Baseline  39/56; 45/56 on 2/12    Time  4    Period  Weeks    Status  Achieved    Target Date  08/12/18      PT SHORT TERM GOAL #5   Title  Pt will report being able to stand at her standing desk at work for 10-12 minutes at a time with pain </= 4/10    Baseline  unmet per pt    Time  4    Period  Weeks    Status  Not Met    Target Date  08/12/18        PT Long Term Goals - 08/12/18 2134      PT LONG TERM GOAL #1   Title  patient to be independent with advanced HEP   (ALL LTG due by 09/11/18)    Time  8    Period  Weeks    Status  Revised      PT LONG TERM GOAL #2   Title  patient to improve gait speed to >/= 2.6 ft/sec with LRAD for improved functional mobility  Baseline  2.34 ft/sec with RW    Time  8    Period  Weeks    Status  Revised      PT LONG TERM GOAL #3   Title  patient to improve 5x sit to stand to </= 15 seconds without use of UE demonstrating improved mobility    Time  8    Period  Weeks    Status  Revised      PT LONG TERM GOAL #4   Title  patient to improve Berg to >/=  46/56 demosntrating reduced fall risk    Time  8    Period  Weeks    Status  Revised      PT LONG TERM GOAL #5   Title  patient to demonstrate reciprocal stair navigation up 1 flight with L rail and cane in RUE with supervision    Time  8    Period  Weeks    Status  New      PT LONG TERM GOAL #6   Title  Pt will report being able to stand at her standing desk at work for 8-10 minutes at a time    Time  8    Period  Weeks    Status  Revised            Plan - 08/19/18 1625    Clinical Impression Statement  Skilled session focused on gait, strengthening and balance.  Pt did well with SPC today with no LOB.  Cadence decreased with SPC as well as with RW.  Continue PT per POC.    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  Gait with SPC again.    PT Home Exercise Plan   Access Code: [email or web address]     Consulted and Agree with Plan of Care  Patient       Patient will benefit from skilled therapeutic intervention in order to improve the following deficits and impairments:  Abnormal gait, Decreased activity tolerance, Decreased balance, Decreased mobility, Difficulty walking, Decreased strength, Impaired flexibility, Pain  Visit Diagnosis: Other abnormalities of gait and mobility  Muscle weakness (generalized)  Unsteadiness on feet     Problem List Patient Active Problem List   Diagnosis Date Noted  . Greater trochanteric pain syndrome 06/26/2018   . Nondisplaced fracture of navicular bone of left foot with routine healing 12/21/2016  . Closed displaced fracture of navicular bone of left foot 12/12/2016  . Essential hypertension, benign 05/06/2016  . Left Frontal-Parietal CVA 04/11/2016  . ST elevation - in setting of CVA & HTN Emergency - NOT MI 04/11/2016  . Multiple vessel coronary artery disease 04/11/2016  . Diabetes mellitus type 2, insulin dependent ([place]) 04/11/2016  . Acute respiratory failure with hypoxia ([place]) 04/11/2016  . Hyperlipidemia 04/11/2016  . Acute pulmonary edema (HCC)   . Hypertensive emergency   . Acute appendicitis 02/29/2016    Narda Bonds, PTA [place] 08/19/18 4:33 PM Phone: [phone] Fax: [facility] [address] [place], [place],  [address] Phone: [phone]   Fax:  [phone]  Name: Rebecca Wagner MRN: [id number] Date of Birth: Mar 14, 1961    PHYSICAL THERAPY DISCHARGE SUMMARY  Visits from Start of Care: 12  Plan: Patient agrees to discharge.  Patient goals were partially met. Patient is being discharged due to not returning since the last visit.  ?????      Pt did not return to PT.   Lyndee Hensen, PT, DPT 2:54 PM  12/14/18

## 2018-08-21 ENCOUNTER — Ambulatory Visit: Payer: 59 | Admitting: Sports Medicine

## 2018-08-23 ENCOUNTER — Inpatient Hospital Stay (HOSPITAL_BASED_OUTPATIENT_CLINIC_OR_DEPARTMENT_OTHER)
Admission: EM | Admit: 2018-08-23 | Discharge: 2018-08-25 | DRG: 305 | Disposition: A | Discharge status: Home / Self Care | Payer: 59 | Attending: Internal Medicine | Admitting: Internal Medicine

## 2018-08-23 ENCOUNTER — Emergency Department (HOSPITAL_BASED_OUTPATIENT_CLINIC_OR_DEPARTMENT_OTHER): Payer: 59

## 2018-08-23 ENCOUNTER — Other Ambulatory Visit: Payer: Self-pay

## 2018-08-23 ENCOUNTER — Encounter (HOSPITAL_BASED_OUTPATIENT_CLINIC_OR_DEPARTMENT_OTHER): Payer: Self-pay | Admitting: Emergency Medicine

## 2018-08-23 DIAGNOSIS — R778 Other specified abnormalities of plasma proteins: Secondary | ICD-10-CM | POA: Diagnosis present

## 2018-08-23 DIAGNOSIS — I160 Hypertensive urgency: Secondary | ICD-10-CM | POA: Diagnosis present

## 2018-08-23 DIAGNOSIS — I5042 Chronic combined systolic (congestive) and diastolic (congestive) heart failure: Secondary | ICD-10-CM | POA: Diagnosis not present

## 2018-08-23 DIAGNOSIS — N179 Acute kidney failure, unspecified: Secondary | ICD-10-CM | POA: Diagnosis not present

## 2018-08-23 DIAGNOSIS — I255 Ischemic cardiomyopathy: Secondary | ICD-10-CM | POA: Diagnosis present

## 2018-08-23 DIAGNOSIS — Z7982 Long term (current) use of aspirin: Secondary | ICD-10-CM

## 2018-08-23 DIAGNOSIS — Z87891 Personal history of nicotine dependence: Secondary | ICD-10-CM | POA: Diagnosis not present

## 2018-08-23 DIAGNOSIS — Z8673 Personal history of transient ischemic attack (TIA), and cerebral infarction without residual deficits: Secondary | ICD-10-CM | POA: Diagnosis not present

## 2018-08-23 DIAGNOSIS — Z7902 Long term (current) use of antithrombotics/antiplatelets: Secondary | ICD-10-CM | POA: Diagnosis not present

## 2018-08-23 DIAGNOSIS — Z833 Family history of diabetes mellitus: Secondary | ICD-10-CM

## 2018-08-23 DIAGNOSIS — Z8249 Family history of ischemic heart disease and other diseases of the circulatory system: Secondary | ICD-10-CM | POA: Diagnosis not present

## 2018-08-23 DIAGNOSIS — I2510 Atherosclerotic heart disease of native coronary artery without angina pectoris: Secondary | ICD-10-CM | POA: Diagnosis present

## 2018-08-23 DIAGNOSIS — E1159 Type 2 diabetes mellitus with other circulatory complications: Secondary | ICD-10-CM | POA: Diagnosis present

## 2018-08-23 DIAGNOSIS — Z794 Long term (current) use of insulin: Secondary | ICD-10-CM | POA: Diagnosis not present

## 2018-08-23 DIAGNOSIS — I110 Hypertensive heart disease with heart failure: Secondary | ICD-10-CM | POA: Diagnosis not present

## 2018-08-23 DIAGNOSIS — I248 Other forms of acute ischemic heart disease: Secondary | ICD-10-CM | POA: Diagnosis not present

## 2018-08-23 DIAGNOSIS — R112 Nausea with vomiting, unspecified: Secondary | ICD-10-CM | POA: Diagnosis not present

## 2018-08-23 DIAGNOSIS — Z79899 Other long term (current) drug therapy: Secondary | ICD-10-CM

## 2018-08-23 DIAGNOSIS — E1151 Type 2 diabetes mellitus with diabetic peripheral angiopathy without gangrene: Secondary | ICD-10-CM | POA: Diagnosis present

## 2018-08-23 DIAGNOSIS — I161 Hypertensive emergency: Secondary | ICD-10-CM | POA: Diagnosis present

## 2018-08-23 DIAGNOSIS — Z9049 Acquired absence of other specified parts of digestive tract: Secondary | ICD-10-CM

## 2018-08-23 DIAGNOSIS — R000 Tachycardia, unspecified: Secondary | ICD-10-CM | POA: Diagnosis not present

## 2018-08-23 DIAGNOSIS — A084 Viral intestinal infection, unspecified: Secondary | ICD-10-CM | POA: Diagnosis not present

## 2018-08-23 DIAGNOSIS — E785 Hyperlipidemia, unspecified: Secondary | ICD-10-CM | POA: Diagnosis not present

## 2018-08-23 DIAGNOSIS — R7989 Other specified abnormal findings of blood chemistry: Secondary | ICD-10-CM | POA: Diagnosis present

## 2018-08-23 LAB — COMPREHENSIVE METABOLIC PANEL
ALT: 17 U/L (ref 0–44)
AST: 24 U/L (ref 15–41)
Albumin: 3.7 g/dL (ref 3.5–5.0)
Alkaline Phosphatase: 71 U/L (ref 38–126)
Anion gap: 10 (ref 5–15)
BUN: 31 mg/dL — ABNORMAL HIGH (ref 6–20)
CO2: 26 mmol/L (ref 22–32)
Calcium: 9.3 mg/dL (ref 8.9–10.3)
Chloride: 93 mmol/L — ABNORMAL LOW (ref 98–111)
Creatinine, Ser: 1.13 mg/dL — ABNORMAL HIGH (ref 0.44–1.00)
GFR calc non Af Amer: 53 mL/min — ABNORMAL LOW (ref >60)
Glucose, Bld: 293 mg/dL — ABNORMAL HIGH (ref 70–99)
Potassium: 3.7 mmol/L (ref 3.5–5.1)
Sodium: 129 mmol/L — ABNORMAL LOW (ref 135–145)
Total Bilirubin: 0.6 mg/dL (ref 0.3–1.2)
Total Protein: 7.5 g/dL (ref 6.5–8.1)

## 2018-08-23 LAB — TROPONIN I
Troponin I: 0.68 ng/mL (ref <0.03)
Troponin I: 0.52 ng/mL (ref <0.03)

## 2018-08-23 LAB — CBC
HEMATOCRIT: 48.8 % — AB (ref 36.0–46.0)
Hemoglobin: 15.8 g/dL — ABNORMAL HIGH (ref 12.0–15.0)
MCH: 28.8 pg (ref 26.0–34.0)
MCHC: 32.4 g/dL (ref 30.0–36.0)
MCV: 88.9 fL (ref 80.0–100.0)
Platelets: 311 10*3/uL (ref 150–400)
RBC: 5.49 MIL/uL — ABNORMAL HIGH (ref 3.87–5.11)
RDW: 12 % (ref 11.5–15.5)
WBC: 12 10*3/uL — AB (ref 4.0–10.5)
nRBC: 0 % (ref 0.0–0.2)

## 2018-08-23 LAB — LIPASE, BLOOD: Lipase: 25 U/L (ref 11–51)

## 2018-08-23 LAB — HEPARIN LEVEL (UNFRACTIONATED): HEPARIN UNFRACTIONATED: 0.24 [IU]/mL — AB (ref 0.30–0.70)

## 2018-08-23 LAB — GLUCOSE, CAPILLARY: Glucose-Capillary: 220 mg/dL — ABNORMAL HIGH (ref 70–99)

## 2018-08-23 MED ORDER — HEPARIN (PORCINE) 25000 UT/250ML-% IV SOLN
1400.0000 [IU]/h | INTRAVENOUS | Status: DC
  Administered 2018-08-23: 950 [IU]/h via INTRAVENOUS
  Filled 2018-08-23: qty 250

## 2018-08-23 MED ORDER — LOSARTAN POTASSIUM 50 MG PO TABS
50.0000 mg | ORAL_TABLET | Freq: Every day | ORAL | Status: DC
  Administered 2018-08-23: 50 mg via ORAL
  Filled 2018-08-23: qty 1

## 2018-08-23 MED ORDER — INSULIN GLARGINE 100 UNIT/ML ~~LOC~~ SOLN
30.0000 [IU] | Freq: Every day | SUBCUTANEOUS | Status: DC
  Administered 2018-08-23 – 2018-08-24 (×2): 30 [IU] via SUBCUTANEOUS
  Filled 2018-08-23 (×2): qty 0.3

## 2018-08-23 MED ORDER — CARVEDILOL 12.5 MG PO TABS
12.5000 mg | ORAL_TABLET | Freq: Two times a day (BID) | ORAL | Status: DC
  Administered 2018-08-23: 12.5 mg via ORAL
  Filled 2018-08-23 (×2): qty 1

## 2018-08-23 MED ORDER — LABETALOL HCL 5 MG/ML IV SOLN
20.0000 mg | Freq: Once | INTRAVENOUS | Status: AC
  Administered 2018-08-23: 20 mg via INTRAVENOUS
  Filled 2018-08-23: qty 4

## 2018-08-23 MED ORDER — DILTIAZEM HCL ER COATED BEADS 180 MG PO CP24
180.0000 mg | ORAL_CAPSULE | Freq: Every day | ORAL | Status: DC
  Administered 2018-08-23: 180 mg via ORAL
  Filled 2018-08-23 (×4): qty 1

## 2018-08-23 MED ORDER — SODIUM CHLORIDE 0.9 % IV BOLUS
1000.0000 mL | Freq: Once | INTRAVENOUS | Status: AC
  Administered 2018-08-23: 1000 mL via INTRAVENOUS

## 2018-08-23 MED ORDER — SODIUM CHLORIDE 0.9 % IV SOLN
INTRAVENOUS | Status: AC
  Administered 2018-08-23 – 2018-08-24 (×2): via INTRAVENOUS

## 2018-08-23 MED ORDER — ONDANSETRON HCL 4 MG/2ML IJ SOLN
4.0000 mg | Freq: Once | INTRAMUSCULAR | Status: AC
  Administered 2018-08-23: 4 mg via INTRAVENOUS
  Filled 2018-08-23: qty 2

## 2018-08-23 MED ORDER — MAGNESIUM OXIDE 400 (241.3 MG) MG PO TABS
200.0000 mg | ORAL_TABLET | Freq: Two times a day (BID) | ORAL | Status: DC
  Administered 2018-08-23 – 2018-08-25 (×4): 200 mg via ORAL
  Filled 2018-08-23 (×4): qty 1

## 2018-08-23 MED ORDER — ASPIRIN 81 MG PO CHEW
81.0000 mg | CHEWABLE_TABLET | Freq: Every day | ORAL | Status: DC
  Administered 2018-08-24 – 2018-08-25 (×2): 81 mg via ORAL
  Filled 2018-08-23 (×2): qty 1

## 2018-08-23 MED ORDER — INSULIN ASPART 100 UNIT/ML ~~LOC~~ SOLN
0.0000 [IU] | SUBCUTANEOUS | Status: DC
  Administered 2018-08-23: 3 [IU] via SUBCUTANEOUS
  Administered 2018-08-24: 2 [IU] via SUBCUTANEOUS
  Administered 2018-08-24: 5 [IU] via SUBCUTANEOUS
  Administered 2018-08-24: 2 [IU] via SUBCUTANEOUS
  Administered 2018-08-24 (×3): 3 [IU] via SUBCUTANEOUS
  Administered 2018-08-25: 2 [IU] via SUBCUTANEOUS
  Administered 2018-08-25: 1 [IU] via SUBCUTANEOUS
  Administered 2018-08-25: 2 [IU] via SUBCUTANEOUS

## 2018-08-23 MED ORDER — ACETAMINOPHEN 325 MG PO TABS: 650.0000 mg | ORAL_TABLET | Freq: Four times a day (QID) | ORAL | Status: DC | PRN

## 2018-08-23 MED ORDER — PRAVASTATIN SODIUM 10 MG PO TABS
20.0000 mg | ORAL_TABLET | Freq: Every day | ORAL | Status: DC
  Administered 2018-08-23 – 2018-08-24 (×2): 20 mg via ORAL
  Filled 2018-08-23 (×2): qty 2

## 2018-08-23 MED ORDER — HEPARIN BOLUS VIA INFUSION
3500.0000 [IU] | Freq: Once | INTRAVENOUS | Status: AC
  Administered 2018-08-23: 3500 [IU] via INTRAVENOUS

## 2018-08-23 MED ORDER — CLOPIDOGREL BISULFATE 75 MG PO TABS
75.0000 mg | ORAL_TABLET | Freq: Every day | ORAL | Status: DC
  Administered 2018-08-24 – 2018-08-25 (×2): 75 mg via ORAL
  Filled 2018-08-23 (×2): qty 1

## 2018-08-23 MED ORDER — CLONIDINE HCL 0.2 MG PO TABS
0.2000 mg | ORAL_TABLET | Freq: Two times a day (BID) | ORAL | Status: DC
  Administered 2018-08-23 – 2018-08-25 (×3): 0.2 mg via ORAL
  Filled 2018-08-23 (×4): qty 1

## 2018-08-23 MED ORDER — ACETAMINOPHEN 650 MG RE SUPP: 650.0000 mg | Freq: Four times a day (QID) | RECTAL | Status: DC | PRN

## 2018-08-23 MED ORDER — ONDANSETRON HCL 4 MG PO TABS
4.0000 mg | ORAL_TABLET | Freq: Four times a day (QID) | ORAL | Status: DC | PRN
  Administered 2018-08-25: 4 mg via ORAL
  Filled 2018-08-23: qty 1

## 2018-08-23 MED ORDER — CYCLOBENZAPRINE HCL 10 MG PO TABS
10.0000 mg | ORAL_TABLET | Freq: Every day | ORAL | Status: DC
  Administered 2018-08-23 – 2018-08-24 (×2): 10 mg via ORAL
  Filled 2018-08-23 (×2): qty 1

## 2018-08-23 MED ORDER — GABAPENTIN 400 MG PO CAPS: 400.0000 mg | ORAL_CAPSULE | Freq: Two times a day (BID) | ORAL | Status: DC | PRN

## 2018-08-23 MED ORDER — FAMOTIDINE 20 MG PO TABS
20.0000 mg | ORAL_TABLET | Freq: Two times a day (BID) | ORAL | Status: DC
  Administered 2018-08-23 – 2018-08-25 (×4): 20 mg via ORAL
  Filled 2018-08-23 (×4): qty 1

## 2018-08-23 MED ORDER — ONDANSETRON HCL 4 MG/2ML IJ SOLN
4.0000 mg | Freq: Four times a day (QID) | INTRAMUSCULAR | Status: DC | PRN
  Administered 2018-08-24: 4 mg via INTRAVENOUS

## 2018-08-23 MED ORDER — CARVEDILOL 25 MG PO TABS
25.0000 mg | ORAL_TABLET | Freq: Two times a day (BID) | ORAL | Status: DC
  Administered 2018-08-24 – 2018-08-25 (×3): 25 mg via ORAL
  Filled 2018-08-23 (×3): qty 1

## 2018-08-23 MED ORDER — HYDRALAZINE HCL 20 MG/ML IJ SOLN: 10.0000 mg | INTRAMUSCULAR | Status: DC | PRN

## 2018-08-23 NOTE — H&P (Signed)
History and Physical    Rebecca Wagner ZOX:[id number] DOB: [birth year] DOA: 08/23/2018  PCP: Leilani Able, MD  Patient coming from: Home.  Chief Complaint: Nausea vomiting.  HPI: Rebecca Wagner is a 58 y.o. female with history of CAD, CVA, diabetes mellitus, hypertension presents to the ER at [facility] with complaint of nausea vomiting.  Patient has been having nausea vomiting for last couple of days and has not taken her antihypertensive for at least 2 days.  Denies any chest pain abdominal pain shortness of breath headache or any visual symptoms.  Denies any sick contacts or recent travel.  ED Course: In the ER LFTs were normal troponin was mildly elevated EKG was showing sinus tachycardia.  Patient blood pressure was markedly elevated with systolic blood pressure 198 diastolic 110.  Was given hydralazine and labetalol following which blood pressure improved and also was given patient's home dose of medications.  Since patient's troponin was mildly elevated heparin was started given the history of CAD.  At the time of my exam patient's nausea is improved and is requesting diet at this time.  Denies any chest pain.  Abdomen appears benign on exam.  Review of Systems: As per HPI, rest all negative.   Past Medical History:  Diagnosis Date  . Arthritis   . Depression   . Diabetes mellitus without complication (HCC)   . Hypertension     Past Surgical History:  Procedure Laterality Date  . CARDIAC CATHETERIZATION N/A 04/10/2016   Procedure: Left Heart Cath and Coronary Angiography;  Surgeon: Iran Ouch, MD;  [facility];  Service: Cardiovascular;  Laterality: N/A;  . CESAREAN SECTION    . LAPAROSCOPIC APPENDECTOMY N/A 02/29/2016   Procedure: LAPAROSCOPIC APPENDECTOMY;  Surgeon: Axel Filler, MD;  [facility];  Service: General;  Laterality: N/A;     reports that she has quit smoking. She has never used smokeless tobacco. She reports that  she does not drink alcohol or use drugs.  Allergies  Allergen Reactions  . Eggs Or Egg-Derived Products Nausea And Vomiting  . Metformin And Related Diarrhea and Other (See Comments)    Extreme gas, also    Family History  Problem Relation Age of Onset  . Diabetes Mother   . Heart disease Father        onset late 76s    Prior to Admission medications   Medication Sig Start Date End Date Taking? Authorizing Provider  acetaminophen (TYLENOL) 325 MG tablet Take 2 tablets (650 mg total) by mouth every 6 (six) hours as needed for fever. Patient taking differently: Take 650 mg by mouth every 6 (six) hours as needed (for pain, headaches, or fever).  04/15/16  Yes Alison Murray, MD  calcium carbonate (OSCAL) 1500 (600 Ca) MG TABS tablet Take 600 mg of elemental calcium by mouth daily.   Yes [provider]  Candesartan Cilexetil-HCTZ 32-25 MG TABS Take 0.5 mg by mouth daily. 04/20/18  Yes [provider]  carvedilol (COREG) 25 MG tablet Take 25 mg by mouth 2 (two) times daily with a meal.   Yes [provider]  cloNIDine (CATAPRES) 0.2 MG tablet Take 0.2 mg by mouth at bedtime.  03/10/18  Yes [provider]  clopidogrel (PLAVIX) 75 MG tablet Take 1 tablet (75 mg total) daily by mouth. NEED OV. Patient taking differently: Take 75 mg by mouth daily.  05/06/17  Yes Azalee Course, PA  cyclobenzaprine (FLEXERIL) 10 MG tablet Take  1 tablet (10 mg total) by mouth at bedtime. 04/17/18  Yes Andrena Mews, DO  Diclofenac Sodium (PENNSAID) 2 % SOLN Place 1 application onto the skin 2 (two) times daily. Patient taking differently: Place 1 application onto the skin 2 (two) times daily. BOTH HIPS 07/23/18  Yes Andrena Mews, DO  diltiazem (CARDIZEM LA) 180 MG 24 hr tablet Take 180 mg by mouth daily.  02/09/18  Yes [provider]  famotidine (PEPCID) 20 MG tablet Take 1 tablet (20 mg total) by mouth 2 (two) times daily. 05/06/16  Yes Azalee Course, PA  gabapentin  (NEURONTIN) 400 MG capsule Take 400 mg by mouth 2 (two) times daily as needed (for hip pain).  05/15/18  Yes [provider]  insulin glargine (LANTUS) 100 UNIT/ML injection Inject 0.1 mLs (10 Units total) into the skin daily. Patient taking differently: Inject 40 Units into the skin at bedtime.  04/16/16  Yes Alison Murray, MD  insulin lispro (HUMALOG) 100 UNIT/ML injection Inject 5-10 Units into the skin 3 (three) times daily before meals.    Yes [provider]  Magnesium 250 MG TABS Take 250 mg by mouth 2 (two) times daily.    Yes [provider]  pravastatin (PRAVACHOL) 20 MG tablet Take 20 mg by mouth at bedtime.  02/24/18  Yes [provider]  traMADol (ULTRAM) 50 MG tablet Take 50 mg by mouth every 8 (eight) hours as needed (for pain).  07/03/18  Yes [provider]  aspirin 81 MG chewable tablet Chew 1 tablet (81 mg total) by mouth daily. Patient not taking: Reported on 08/23/2018 05/06/16   Azalee Course, PA  losartan (COZAAR) 50 MG tablet Take 1 tablet (50 mg total) by mouth daily. 05/06/16   Azalee Course, PA    Physical Exam: Vitals:   08/23/18 1615 08/23/18 1645 08/23/18 1700 08/23/18 1857  BP: (!) 171/93 (!) 182/98 (!) 191/102 (!) 172/94  Pulse: 91 88 91 98  Resp: 14 19 (!) 24 20  Temp:    98.4 F (36.9 C)  TempSrc:    Oral  SpO2: 98% 99% 98% 100%  Weight:    86.2 kg  Height:     (1.626 m)      Constitutional: Moderately built and nourished. Vitals:   08/23/18 1615 08/23/18 1645 08/23/18 1700 08/23/18 1857  BP: (!) 171/93 (!) 182/98 (!) 191/102 (!) 172/94  Pulse: 91 88 91 98  Resp: 14 19 (!) 24 20  Temp:    98.4 F (36.9 C)  TempSrc:    Oral  SpO2: 98% 99% 98% 100%  Weight:    86.2 kg  Height:     (1.626 m)   Eyes: Anicteric no pallor. ENMT: No discharge from the ears eyes nose and mouth. Neck: No JVD appreciated no mass felt. Respiratory: No rhonchi or crepitations. Cardiovascular: S1-S2 heard. Abdomen: Soft  nontender bowel sounds present. Musculoskeletal: No edema.  No joint effusion. Skin: No rash. Neurologic: Alert awake oriented to time place and person.  Moves all extremities. Psychiatric: Appears normal.  Normal affect.   Labs on Admission: I have personally reviewed following labs and imaging studies  CBC: Recent Labs  Lab 08/23/18 1328  WBC 12.0*  HGB 15.8*  HCT 48.8*  MCV 88.9  PLT 311   Basic Metabolic Panel: Recent Labs  Lab 08/23/18 1328  NA 129*  K 3.7  CL 93*  CO2 26  GLUCOSE 293*  BUN 31*  CREATININE 1.13*  CALCIUM 9.3   GFR: Estimated Creatinine Clearance: 57 mL/min (A) (by C-G formula based on SCr of 1.13 mg/dL (H)). Liver Function Tests: Recent Labs  Lab 08/23/18 1328  AST 24  ALT 17  ALKPHOS 71  BILITOT 0.6  PROT 7.5  ALBUMIN 3.7   Recent Labs  Lab 08/23/18 1328  LIPASE 25   No results for input(s): AMMONIA in the last 168 hours. Coagulation Profile: No results for input(s): INR, PROTIME in the last 168 hours. Cardiac Enzymes: Recent Labs  Lab 08/23/18 1328  TROPONINI 0.68*   BNP (last 3 results) No results for input(s): PROBNP in the last 8760 hours. HbA1C: No results for input(s): HGBA1C in the last 72 hours. CBG: No results for input(s): GLUCAP in the last 168 hours. Lipid Profile: No results for input(s): CHOL, HDL, LDLCALC, TRIG, CHOLHDL, LDLDIRECT in the last 72 hours. Thyroid Function Tests: No results for input(s): TSH, T4TOTAL, FREET4, T3FREE, THYROIDAB in the last 72 hours. Anemia Panel: No results for input(s): VITAMINB12, FOLATE, FERRITIN, TIBC, IRON, RETICCTPCT in the last 72 hours. Urine analysis:    Component Value Date/Time   COLORURINE YELLOW 04/10/2016 1824   APPEARANCEUR CLEAR 04/10/2016 1824   LABSPEC 1.020 04/10/2016 1824   PHURINE 5.5 04/10/2016 1824   GLUCOSEU 250 (A) 04/10/2016 1824   HGBUR NEGATIVE 04/10/2016 1824   BILIRUBINUR NEGATIVE 04/10/2016 1824   BILIRUBINUR large (A) 02/28/2016 1820    KETONESUR NEGATIVE 04/10/2016 1824   PROTEINUR NEGATIVE 04/10/2016 1824   UROBILINOGEN 1.0 02/28/2016 1820   NITRITE NEGATIVE 04/10/2016 1824   LEUKOCYTESUR NEGATIVE 04/10/2016 1824   Sepsis Labs: @LABRCNTIP (procalcitonin:4,lacticidven:4) )No results found for this or any previous visit (from the past 240 hour(s)).   Radiological Exams on Admission: Dg Abdomen Acute W/chest  Result Date: 08/23/2018 CLINICAL DATA:  Nausea vomiting. Weakness. EXAM: DG ABDOMEN ACUTE W/ 1V CHEST COMPARISON:  04/11/2016 FINDINGS: There is no evidence of dilated bowel loops or free intraperitoneal air. No radiopaque calculi or other significant radiographic abnormality is seen. Enlarged cardiac silhouette. Calcific atherosclerotic disease and tortuosity of the aorta. Left mid lung base scarring versus atelectasis. Chronic severe sclerotic endplate changes of L3-L4, thought to be degenerative. IMPRESSION: 1. Nonobstructive bowel gas pattern. 2. Enlarged cardiac silhouette. 3. Calcific atherosclerotic disease of the aorta. 4. Left midlung atelectasis versus scarring. Electronically Signed   By: Ted Mcalpine M.D.   On: 08/23/2018 15:28    EKG: Independently reviewed.  Sinus tachycardia.  Assessment/Plan Principal Problem:   Hypertensive emergency Active Problems:   Multiple vessel coronary artery disease   Type 2 diabetes mellitus with vascular disease (HCC)   ARF (acute renal failure) (HCC)   Elevated troponin   Hypertensive urgency    1. Hypertensive urgency likely from patient missing her medications from nausea vomiting.  Improved with IV labetalol hydralazine and also given home medications.  Will continue home medication except for ARB due to acute renal failure.  I have added PRN IV hydralazine.  Closely follow blood pressure trends. 2. Nausea vomiting likely from gastroenteritis.  Abdomen appears benign LFTs were normal.  Requesting diet at this time.  Will try diet. 3. Elevated troponin with  history of CAD denies any chest pain.  We will cycle cardiac markers continue Coreg statins aspirin and Plavix.  Patient has been started on heparin at the ER.  Which will be continued.  Cardiology notified. 4. Acute renal failure likely from vomiting.  Holding ARB gently hydrate.  Note that patient has history of ischemic cardiomyopathy  last EF was 40 to 45% closely follow respiratory status.  Follow metabolic panel. 5. Diabetes mellitus type 2 on Lantus insulin usually takes 40 units at bedtime I have decreased to 30 because of vomiting and renal failure.  On sliding scale coverage.  Follow metabolic panel and CBCs. 6. History of stroke on statins and antiplatelet agents.  No obvious residual defects.   DVT prophylaxis: Heparin. Code Status: Full code. Family Communication: Discussed with patient. Disposition Plan: Home. Consults called: Cardiology. Admission status: Observation.   Eduard Clos MD [facility] Pager [phone].  If 7PM-7AM, please contact night-coverage [email or web address] Password [facility]  08/23/2018, 8:44 PM

## 2018-08-23 NOTE — Progress Notes (Signed)
ANTICOAGULATION CONSULT NOTE  Pharmacy Consult:  Heparin Indication: chest pain/ACS  Allergies  Allergen Reactions  . Eggs Or Egg-Derived Products Nausea And Vomiting  . Metformin And Related Diarrhea and Other (See Comments)    Extreme gas, also    Patient Measurements: Height: 5\' 4"  (162.6 cm) Weight: 190 lb (86.2 kg) IBW/kg (Calculated) : 54.7 Heparin Dosing Weight: 77 kg  Vital Signs: Temp: 98.5 F (36.9 C) (02/23 2053) Temp Source: Oral (02/23 2053) BP: 180/93 (02/23 2053) Pulse Rate: 96 (02/23 2053)  Labs: Recent Labs    08/23/18 1328 08/23/18 2133  HGB 15.8*  --   HCT 48.8*  --   PLT 311  --   HEPARINUNFRC  --  0.24*  CREATININE 1.13*  --   TROPONINI 0.68*  --     Estimated Creatinine Clearance: 57 mL/min (A) (by C-G formula based on SCr of 1.13 mg/dL (H)).   Medical History: Past Medical History:  Diagnosis Date  . Arthritis   . Depression   . Diabetes mellitus without complication (HCC)   . Hypertension     Assessment: Rebecca Wagner presented with emesis since 08/21/18.  Found to have elevated troponin and Pharmacy consulted to start IV heparin for ACS.  Baseline labs and home meds reviewed.  Initial heparin level = 0.24  Goal of Therapy:  Heparin level 0.3-0.7 units/ml Monitor platelets by anticoagulation protocol: Yes   Plan:  Heparin to 1150 units / hr Daily heparin level and CBC  Thank you Okey Regal, PharmD   08/23/2018, 10:21 PM

## 2018-08-23 NOTE — Progress Notes (Signed)
ANTICOAGULATION CONSULT NOTE - Initial Consult  Pharmacy Consult:  Heparin Indication: chest pain/ACS  Allergies  Allergen Reactions  . Metformin And Related Other (See Comments)    Extreme gas and diarrhea    Patient Measurements: Height: 5\' 4"  (162.6 cm) Weight: 215 lb (97.5 kg) IBW/kg (Calculated) : 54.7 Heparin Dosing Weight: 77 kg  Vital Signs: Temp: 98 F (36.7 C) (02/23 1221) Temp Source: Oral (02/23 1221) BP: 198/98 (02/23 1429) Pulse Rate: 103 (02/23 1429)  Labs: Recent Labs    08/23/18 1328  HGB 15.8*  HCT 48.8*  PLT 311  CREATININE 1.13*  TROPONINI 0.68*    Estimated Creatinine Clearance: 60.8 mL/min (A) (by C-G formula based on SCr of 1.13 mg/dL (H)).   Medical History: Past Medical History:  Diagnosis Date  . Arthritis   . Depression   . Diabetes mellitus without complication (HCC)   . Hypertension     Assessment: 39 YOF presented with emesis since 08/21/18.  Found to have elevated troponin and Pharmacy consulted to start IV heparin for ACS.  Baseline labs and home meds reviewed.  Goal of Therapy:  Heparin level 0.3-0.7 units/ml Monitor platelets by anticoagulation protocol: Yes   Plan:  Heparin 3500 units IV bolus, then Heparin gtt at 950 units/hr Check 6 hr heparin level Daily heparin level and CBC   Laveda Demedeiros D. Laney Potash, PharmD, BCPS, BCCCP 08/23/2018, 3:08 PM

## 2018-08-23 NOTE — ED Provider Notes (Addendum)
[facility] EMERGENCY DEPARTMENT Provider Note   CSN: [id number] Arrival date & time: 08/23/18  1215    History   Chief Complaint Chief Complaint  Patient presents with  . Emesis    HPI Rebecca Wagner is a 58 y.o. female.     HPI Patient presents with vomiting for the last 2 days.  Also nausea.  No diarrhea.  No abdominal pain.  Previous appendectomy but no other abdominal surgery.  No fevers or chills.  No cough.  Has a slight dull headache.  No sick contacts.  No one else vomiting.  No chest pain.  States she did not take her blood pressure medicines this morning. Past Medical History:  Diagnosis Date  . Arthritis   . Depression   . Diabetes mellitus without complication (HCC)   . Hypertension     Patient Active Problem List   Diagnosis Date Noted  . Greater trochanteric pain syndrome 06/26/2018  . Nondisplaced fracture of navicular bone of left foot with routine healing 12/21/2016  . Closed displaced fracture of navicular bone of left foot 12/12/2016  . Essential hypertension, benign 05/06/2016  . Left Frontal-Parietal CVA 04/11/2016  . ST elevation - in setting of CVA & HTN Emergency - NOT MI 04/11/2016  . Multiple vessel coronary artery disease 04/11/2016  . Diabetes mellitus type 2, insulin dependent (HCC) 04/11/2016  . Acute respiratory failure with hypoxia (HCC) 04/11/2016  . Hyperlipidemia 04/11/2016  . Acute pulmonary edema (HCC)   . Hypertensive emergency   . Acute appendicitis 02/29/2016    Past Surgical History:  Procedure Laterality Date  . CARDIAC CATHETERIZATION N/A 04/10/2016   Procedure: Left Heart Cath and Coronary Angiography;  Surgeon: Iran Ouch, MD;  Location: [facility] INVASIVE CV LAB;  Service: Cardiovascular;  Laterality: N/A;  . CESAREAN SECTION    . LAPAROSCOPIC APPENDECTOMY N/A 02/29/2016   Procedure: LAPAROSCOPIC APPENDECTOMY;  Surgeon: Axel Filler, MD;  Location: [facility];  Service: General;  Laterality: N/A;     OB  History   No obstetric history on file.      Home Medications    Prior to Admission medications   Medication Sig Start Date End Date Taking? Authorizing Provider  acetaminophen (TYLENOL) 325 MG tablet Take 2 tablets (650 mg total) by mouth every 6 (six) hours as needed for fever. 04/15/16   Alison Murray, MD  aspirin 81 MG chewable tablet Chew 1 tablet (81 mg total) by mouth daily. 05/06/16   Azalee Course, PA  calcium carbonate (OSCAL) 1500 (600 Ca) MG TABS tablet Take 600 mg of elemental calcium by mouth daily.    [provider]  carvedilol (COREG) 12.5 MG tablet Take 1 tablet (12.5 mg total) by mouth 2 (two) times daily with a meal. 05/06/16   Azalee Course, PA  cloNIDine (CATAPRES) 0.2 MG tablet Take 0.2 mg by mouth 2 (two) times daily. 03/10/18   [provider]  clopidogrel (PLAVIX) 75 MG tablet Take 1 tablet (75 mg total) daily by mouth. NEED OV. 05/06/17   Azalee Course, PA  cyclobenzaprine (FLEXERIL) 10 MG tablet Take 1 tablet (10 mg total) by mouth at bedtime. 04/17/18   Andrena Mews, DO  Diclofenac Sodium (PENNSAID) 2 % SOLN Place 1 application onto the skin 2 (two) times daily. 07/23/18   Andrena Mews, DO  diltiazem (CARDIZEM LA) 180 MG 24 hr tablet TAKE 1 TABLET (180 MG) BY ORAL ROUTE ONCE DAILY FOR 30 DAYS 02/09/18   [provider]  famotidine (PEPCID) 20 MG tablet Take 1 tablet (20 mg total) by mouth 2 (two) times daily. 05/06/16   Azalee Course, PA  gabapentin (NEURONTIN) 400 MG capsule  05/15/18   [provider]  insulin glargine (LANTUS) 100 UNIT/ML injection Inject 0.1 mLs (10 Units total) into the skin daily. 04/16/16   Alison Murray, MD  insulin lispro (HUMALOG) 100 UNIT/ML injection Inject 5-10 Units into the skin 3 (three) times daily before meals.     [provider]  Lactobacillus (ACIDOPHILUS PO) Take 1-2 tablets by mouth daily.    [provider]  losartan (COZAAR) 50 MG tablet Take 1 tablet (50 mg total) by mouth daily.  05/06/16   Azalee Course, PA  Magnesium 250 MG TABS Take 250 mg by mouth daily.     [provider]  pravastatin (PRAVACHOL) 20 MG tablet Take 20 mg by mouth daily. 02/24/18   [provider]  traMADol Janean Sark) 50 MG tablet  07/03/18   [provider]    Family History Family History  Problem Relation Age of Onset  . Diabetes Mother   . Heart disease Father        onset late 78s    Social History Social History   Tobacco Use  . Smoking status: Former Games developer  . Smokeless tobacco: Never Used  . Tobacco comment: quit smoking in 2010  Substance Use Topics  . Alcohol use: No  . Drug use: No     Allergies   Metformin and related   Review of Systems Review of Systems  Constitutional: Negative for appetite change, chills and fever.  HENT: Negative for congestion.   Respiratory: Negative for shortness of breath.   Cardiovascular: Negative for chest pain.  Gastrointestinal: Positive for nausea and vomiting. Negative for abdominal pain.  Genitourinary: Negative for flank pain.  Musculoskeletal: Negative for back pain.  Skin: Negative for rash.  Neurological: Negative for weakness.  Psychiatric/Behavioral: Negative for confusion.     Physical Exam Updated Vital Signs BP (!) 198/109   Pulse (!) 107   Temp 98 F (36.7 C) (Oral)   Resp 19   Ht 5\' 4"  (1.626 m)   Wt 97.5 kg   SpO2 99%   BMI 36.90 kg/m   Physical Exam HENT:     Head: Normocephalic.     Mouth/Throat:     Mouth: Mucous membranes are moist.  Eyes:     Pupils: Pupils are equal, round, and reactive to light.  Neck:     Musculoskeletal: Neck supple.  Cardiovascular:     Rate and Rhythm: Regular rhythm.  Pulmonary:     Effort: Pulmonary effort is normal.  Abdominal:     Tenderness: There is no abdominal tenderness.  Musculoskeletal:     Right lower leg: No edema.     Left lower leg: No edema.  Skin:    General: Skin is warm.     Capillary Refill: Capillary refill takes less  than 2 seconds.  Neurological:     General: No focal deficit present.     Mental Status: She is alert.      ED Treatments / Results  Labs (all labs ordered are listed, but only abnormal results are displayed) Labs Reviewed  COMPREHENSIVE METABOLIC PANEL - Abnormal; Notable for the following components:      Result Value   Sodium 129 (*)    Chloride 93 (*)    Glucose, Bld 293 (*)    BUN 31 (*)  Creatinine, Ser 1.13 (*)    GFR calc non Af Amer 53 (*)    All other components within normal limits  CBC - Abnormal; Notable for the following components:   WBC 12.0 (*)    RBC 5.49 (*)    Hemoglobin 15.8 (*)    HCT 48.8 (*)    All other components within normal limits  TROPONIN I - Abnormal; Notable for the following components:   Troponin I 0.68 (*)    All other components within normal limits  LIPASE, BLOOD  HEPARIN LEVEL (UNFRACTIONATED)    EKG EKG Interpretation  Date/Time:  Sunday August 23 2018 14:45:33 EST Ventricular Rate:  101 PR Interval:    QRS Duration: 95 QT Interval:  376 QTC Calculation: 488 R Axis:   -42 Text Interpretation:  Sinus tachycardia Right atrial enlargement Left anterior fascicular block LVH with secondary repolarization abnormality Anterior Q waves, possibly due to LVH No significant change since last tracing Confirmed by Benjiman Core [phone]) on 08/23/2018 2:57:48 PM   Radiology Dg Abdomen Acute W/chest  Result Date: 08/23/2018 CLINICAL DATA:  Nausea vomiting. Weakness. EXAM: DG ABDOMEN ACUTE W/ 1V CHEST COMPARISON:  04/11/2016 FINDINGS: There is no evidence of dilated bowel loops or free intraperitoneal air. No radiopaque calculi or other significant radiographic abnormality is seen. Enlarged cardiac silhouette. Calcific atherosclerotic disease and tortuosity of the aorta. Left mid lung base scarring versus atelectasis. Chronic severe sclerotic endplate changes of L3-L4, thought to be degenerative. IMPRESSION: 1. Nonobstructive bowel gas  pattern. 2. Enlarged cardiac silhouette. 3. Calcific atherosclerotic disease of the aorta. 4. Left midlung atelectasis versus scarring. Electronically Signed   By: Ted Mcalpine M.D.   On: 08/23/2018 15:28    Procedures Procedures (including critical care time)  Medications Ordered in ED Medications  heparin ADULT infusion 100 units/mL (25000 units/242mL sodium chloride 0.45%) (950 Units/hr Intravenous New Bag/Given 08/23/18 1517)  sodium chloride 0.9 % bolus 1,000 mL (0 mLs Intravenous Stopped 08/23/18 1428)  ondansetron (ZOFRAN) injection 4 mg (4 mg Intravenous Given 08/23/18 1327)  sodium chloride 0.9 % bolus 1,000 mL (1,000 mLs Intravenous New Bag/Given 08/23/18 1431)  labetalol (NORMODYNE,TRANDATE) injection 20 mg (20 mg Intravenous Given 08/23/18 1514)  heparin bolus via infusion 3,500 Units (3,500 Units Intravenous Bolus from Bag 08/23/18 1518)     Initial Impression / Assessment and Plan / ED Course  I have reviewed the triage vital signs and the nursing notes.  Pertinent labs & imaging results that were available during my care of the patient were reviewed by me and considered in my medical decision making (see chart for details).       Patient presented with nausea and vomiting.  Improved with that but hypertensive.  Pressure up to 200 systolic.  Troponin elevated at 0.7.  Known multivessel coronary disease with medical management.  Discussed with Dr. Ladona Ridgel.  Thinks patient would benefit from admission to medicine service and cardiology.  Will discuss with internal medicine about transfer.  CRITICAL CARE Performed by: Benjiman Core Total critical care time: 30 minutes Critical care time was exclusive of separately billable procedures and treating other patients. Critical care was necessary to treat or prevent imminent or life-threatening deterioration. Critical care was time spent personally by me on the following activities: development of treatment plan with patient  and/or surrogate as well as nursing, discussions with consultants, evaluation of patient's response to treatment, examination of patient, obtaining history from patient or surrogate, ordering and performing treatments and interventions, ordering and review of  laboratory studies, ordering and review of radiographic studies, pulse oximetry and re-evaluation of patient's condition.    Final Clinical Impressions(s) / ED Diagnoses   Final diagnoses:  Hypertensive emergency    ED Discharge Orders    None       Benjiman Core, MD 08/23/18 1538    Benjiman Core, MD 08/23/18 1539

## 2018-08-23 NOTE — ED Notes (Signed)
Attempted IV start-patient requesting not to have IV in [facility]. Unable to find vein.  [facility] at bedside to transport.

## 2018-08-23 NOTE — ED Notes (Signed)
Unable to administer diltiazem and losartan due to medication not available.

## 2018-08-23 NOTE — ED Notes (Signed)
Troponin 0.68, results given to ED MD and Lupe Carney RN

## 2018-08-23 NOTE — ED Triage Notes (Signed)
Pt reports vomiting since Friday. Denies diarrhea. Denies pain.

## 2018-08-23 NOTE — Progress Notes (Signed)
PATIENT GOT HERE VIA EMS, SHE IS CURRENTLY SITTING IN THE BED, WITH NO COMPLAINS . ADMISSION PAGED TO GET PATIENT ADMITTED

## 2018-08-24 ENCOUNTER — Ambulatory Visit: Payer: 59 | Admitting: Physical Therapy

## 2018-08-24 DIAGNOSIS — E1151 Type 2 diabetes mellitus with diabetic peripheral angiopathy without gangrene: Secondary | ICD-10-CM | POA: Diagnosis present

## 2018-08-24 DIAGNOSIS — Z794 Long term (current) use of insulin: Secondary | ICD-10-CM | POA: Diagnosis not present

## 2018-08-24 DIAGNOSIS — R7989 Other specified abnormal findings of blood chemistry: Secondary | ICD-10-CM | POA: Diagnosis not present

## 2018-08-24 DIAGNOSIS — E785 Hyperlipidemia, unspecified: Secondary | ICD-10-CM | POA: Diagnosis present

## 2018-08-24 DIAGNOSIS — I5042 Chronic combined systolic (congestive) and diastolic (congestive) heart failure: Secondary | ICD-10-CM | POA: Diagnosis present

## 2018-08-24 DIAGNOSIS — Z87891 Personal history of nicotine dependence: Secondary | ICD-10-CM | POA: Diagnosis not present

## 2018-08-24 DIAGNOSIS — I2510 Atherosclerotic heart disease of native coronary artery without angina pectoris: Secondary | ICD-10-CM

## 2018-08-24 DIAGNOSIS — Z833 Family history of diabetes mellitus: Secondary | ICD-10-CM | POA: Diagnosis not present

## 2018-08-24 DIAGNOSIS — I255 Ischemic cardiomyopathy: Secondary | ICD-10-CM | POA: Diagnosis present

## 2018-08-24 DIAGNOSIS — N179 Acute kidney failure, unspecified: Secondary | ICD-10-CM | POA: Diagnosis present

## 2018-08-24 DIAGNOSIS — I248 Other forms of acute ischemic heart disease: Secondary | ICD-10-CM | POA: Diagnosis present

## 2018-08-24 DIAGNOSIS — Z7982 Long term (current) use of aspirin: Secondary | ICD-10-CM | POA: Diagnosis not present

## 2018-08-24 DIAGNOSIS — Z7902 Long term (current) use of antithrombotics/antiplatelets: Secondary | ICD-10-CM | POA: Diagnosis not present

## 2018-08-24 DIAGNOSIS — Z8673 Personal history of transient ischemic attack (TIA), and cerebral infarction without residual deficits: Secondary | ICD-10-CM | POA: Diagnosis not present

## 2018-08-24 DIAGNOSIS — I161 Hypertensive emergency: Principal | ICD-10-CM

## 2018-08-24 DIAGNOSIS — Z8249 Family history of ischemic heart disease and other diseases of the circulatory system: Secondary | ICD-10-CM | POA: Diagnosis not present

## 2018-08-24 DIAGNOSIS — E1159 Type 2 diabetes mellitus with other circulatory complications: Secondary | ICD-10-CM | POA: Diagnosis not present

## 2018-08-24 DIAGNOSIS — I110 Hypertensive heart disease with heart failure: Secondary | ICD-10-CM | POA: Diagnosis present

## 2018-08-24 DIAGNOSIS — Z79899 Other long term (current) drug therapy: Secondary | ICD-10-CM | POA: Diagnosis not present

## 2018-08-24 DIAGNOSIS — Z9049 Acquired absence of other specified parts of digestive tract: Secondary | ICD-10-CM | POA: Diagnosis not present

## 2018-08-24 DIAGNOSIS — A084 Viral intestinal infection, unspecified: Secondary | ICD-10-CM | POA: Diagnosis present

## 2018-08-24 LAB — HEPATIC FUNCTION PANEL
ALT: 17 U/L (ref 0–44)
AST: 21 U/L (ref 15–41)
Albumin: 2.6 g/dL — ABNORMAL LOW (ref 3.5–5.0)
Alkaline Phosphatase: 47 U/L (ref 38–126)
Total Bilirubin: 0.8 mg/dL (ref 0.3–1.2)
Total Protein: 6.2 g/dL — ABNORMAL LOW (ref 6.5–8.1)

## 2018-08-24 LAB — BASIC METABOLIC PANEL
Anion gap: 9 (ref 5–15)
Anion gap: 6 (ref 5–15)
BUN: 27 mg/dL — AB (ref 6–20)
BUN: 24 mg/dL — ABNORMAL HIGH (ref 6–20)
CO2: 22 mmol/L (ref 22–32)
CO2: 25 mmol/L (ref 22–32)
CREATININE: 1.21 mg/dL — AB (ref 0.44–1.00)
Calcium: 8.3 mg/dL — ABNORMAL LOW (ref 8.9–10.3)
Calcium: 8.3 mg/dL — ABNORMAL LOW (ref 8.9–10.3)
Chloride: 103 mmol/L (ref 98–111)
Chloride: 105 mmol/L (ref 98–111)
Creatinine, Ser: 0.98 mg/dL (ref 0.44–1.00)
GFR calc Af Amer: 57 mL/min — ABNORMAL LOW (ref >60)
GFR calc Af Amer: >60 mL/min (ref >60)
GFR calc non Af Amer: 49 mL/min — ABNORMAL LOW (ref >60)
GFR calc non Af Amer: >60 mL/min (ref >60)
Glucose, Bld: 282 mg/dL — ABNORMAL HIGH (ref 70–99)
Glucose, Bld: 193 mg/dL — ABNORMAL HIGH (ref 70–99)
Potassium: 3.4 mmol/L — ABNORMAL LOW (ref 3.5–5.1)
Potassium: 3.3 mmol/L — ABNORMAL LOW (ref 3.5–5.1)
Sodium: 134 mmol/L — ABNORMAL LOW (ref 135–145)
Sodium: 136 mmol/L (ref 135–145)

## 2018-08-24 LAB — GLUCOSE, CAPILLARY
Glucose-Capillary: 267 mg/dL — ABNORMAL HIGH (ref 70–99)
Glucose-Capillary: 211 mg/dL — ABNORMAL HIGH (ref 70–99)
Glucose-Capillary: 188 mg/dL — ABNORMAL HIGH (ref 70–99)
Glucose-Capillary: 202 mg/dL — ABNORMAL HIGH (ref 70–99)
Glucose-Capillary: 174 mg/dL — ABNORMAL HIGH (ref 70–99)
Glucose-Capillary: 210 mg/dL — ABNORMAL HIGH (ref 70–99)
Glucose-Capillary: 193 mg/dL — ABNORMAL HIGH (ref 70–99)

## 2018-08-24 LAB — TROPONIN I
Troponin I: 0.45 ng/mL (ref <0.03)
Troponin I: 0.48 ng/mL (ref <0.03)

## 2018-08-24 LAB — HIV ANTIBODY (ROUTINE TESTING W REFLEX): HIV Screen 4th Generation wRfx: NONREACTIVE

## 2018-08-24 LAB — HEPARIN LEVEL (UNFRACTIONATED): Heparin Unfractionated: 0.15 IU/mL — ABNORMAL LOW (ref 0.30–0.70)

## 2018-08-24 MED ORDER — HEPARIN BOLUS VIA INFUSION
1000.0000 [IU] | Freq: Once | INTRAVENOUS | Status: AC
  Administered 2018-08-24: 1000 [IU] via INTRAVENOUS
  Filled 2018-08-24: qty 1000

## 2018-08-24 MED ORDER — POTASSIUM CHLORIDE CRYS ER 20 MEQ PO TBCR
40.0000 meq | EXTENDED_RELEASE_TABLET | Freq: Once | ORAL | Status: AC
  Administered 2018-08-24: 40 meq via ORAL
  Filled 2018-08-24: qty 2

## 2018-08-24 MED ORDER — DOCUSATE SODIUM 100 MG PO CAPS
200.0000 mg | ORAL_CAPSULE | Freq: Every day | ORAL | Status: DC
  Administered 2018-08-24 – 2018-08-25 (×2): 200 mg via ORAL
  Filled 2018-08-24 (×2): qty 2

## 2018-08-24 MED ORDER — DILTIAZEM HCL ER COATED BEADS 180 MG PO CP24
180.0000 mg | ORAL_CAPSULE | Freq: Every day | ORAL | Status: DC
  Administered 2018-08-24: 180 mg via ORAL
  Filled 2018-08-24: qty 1

## 2018-08-24 MED ORDER — HYDRALAZINE HCL 25 MG PO TABS
25.0000 mg | ORAL_TABLET | Freq: Three times a day (TID) | ORAL | Status: DC
  Administered 2018-08-24 – 2018-08-25 (×4): 25 mg via ORAL
  Filled 2018-08-24 (×4): qty 1

## 2018-08-24 NOTE — Progress Notes (Signed)
ANTICOAGULATION CONSULT NOTE - Follow Up Consult  Pharmacy Consult for heparin Indication: r/o ACS  Labs: Recent Labs    08/23/18 1328 08/23/18 2133 08/24/18 0215  HGB 15.8*  --   --   HCT 48.8*  --   --   PLT 311  --   --   HEPARINUNFRC  --  0.24* 0.15*  CREATININE 1.13*  --   --   TROPONINI 0.68* 0.52*  --     Assessment: 57yo female subtherapeutic on heparin with lower heparin level despite rate increase; no gtt issues or signs of bleeding per RN.  Goal of Therapy:  Heparin level 0.3-0.7 units/ml   Plan:  Will give small heparin bolus of 1000 units and increase heparin gtt by 3 units/kg/hr to 1400 units/hr and check level with next scheduled lab draw.    Vernard Gambles, PharmD, BCPS  08/24/2018,3:23 AM

## 2018-08-24 NOTE — Consult Note (Addendum)
Cardiology Consultation:   Patient ID: Rebecca Wagner MRN: [id number]; DOB: [birth year]  Admit date: 08/23/2018 Date of Consult: 08/24/2018  Primary Care Provider: Leilani Able, MD Primary Cardiologist: No primary care provider on file.  Primary Electrophysiologist:  None    Patient Profile:   Rebecca Wagner is a 58 y.o. female with a hx of CAD, systolic HF, DM, HTN, CVA who is being seen today for the evaluation of elevated troponins, n/v at the request of Dr. Clearnce Sorrel.  History of Present Illness:   Rebecca Wagner is a 58 yo female with PMH of CAD (no targets for revascularization '17), systolic HF, DM, HTN, CVA. She was seen back in 2017 after an acute CVA. EKG with STEMI and underwent cardiac cath showing severe 3v disease with no targets for PCI and culprit unclear. Also felt not to be a candidate for CABG with poor targets. Echo that admission with EF of 40-45% with mild LVH, and akinesis of the apical inferior and apical myocardium, severe hypokinesis of the basal inferior, apical septal, and apical left myocardium, moderate hypokinesis of mid inferoseptal, mid inferior and mid anterolateral myocardium, moderate MR. Placed on medical therapy with coreg, losartan, statin, ASA and plavix. Seen back in the office once with Azalee Course and reported no recurrent symptoms. Notes indicate her blood pressure was not controlled. Coreg was increased to 12.5mg  BID and losartan to  daily. Plans to further titrate as follow up but she did not.   Presented to [facility] with n/v for 2 days. Had not been taking her blood pressure medications as a result. There her blood pressures were elevated in the 190s. Troponin checked 0.68>>0.52>>0.45. EKG showed ST with LVH. She denies having any chest pain prior to admission.   Past Medical History:  Diagnosis Date  . Arthritis   . Depression   . Diabetes mellitus without complication (HCC)   . Hypertension     Past Surgical History:  Procedure  Laterality Date  . CARDIAC CATHETERIZATION N/A 04/10/2016   Procedure: Left Heart Cath and Coronary Angiography;  Surgeon: Iran Ouch, MD;  Location: [facility] INVASIVE CV LAB;  Service: Cardiovascular;  Laterality: N/A;  . CESAREAN SECTION    . LAPAROSCOPIC APPENDECTOMY N/A 02/29/2016   Procedure: LAPAROSCOPIC APPENDECTOMY;  Surgeon: Axel Filler, MD;  Location: [facility] OR;  Service: General;  Laterality: N/A;     Home Medications:  Prior to Admission medications   Medication Sig Start Date End Date Taking? Authorizing Provider  acetaminophen (TYLENOL) 325 MG tablet Take 2 tablets (650 mg total) by mouth every 6 (six) hours as needed for fever. Patient taking differently: Take 650 mg by mouth every 6 (six) hours as needed (for pain, headaches, or fever).  04/15/16  Yes Alison Murray, MD  calcium carbonate (OSCAL) 1500 (600 Ca) MG TABS tablet Take 600 mg of elemental calcium by mouth daily.   Yes [provider]  Candesartan Cilexetil-HCTZ 32-25 MG TABS Take 0.5 mg by mouth daily. 04/20/18  Yes [provider]  carvedilol (COREG) 25 MG tablet Take 25 mg by mouth 2 (two) times daily with a meal.   Yes [provider]  cloNIDine (CATAPRES) 0.2 MG tablet Take 0.2 mg by mouth at bedtime.  03/10/18  Yes [provider]  clopidogrel (PLAVIX) 75 MG tablet Take 1 tablet (75 mg total) daily by mouth. NEED OV. Patient taking differently: Take 75 mg by mouth daily.  05/06/17  Yes Azalee Course, PA  cyclobenzaprine (FLEXERIL) 10 MG tablet  Take 1 tablet (10 mg total) by mouth at bedtime. 04/17/18  Yes Andrena Mews, DO  Diclofenac Sodium (PENNSAID) 2 % SOLN Place 1 application onto the skin 2 (two) times daily. Patient taking differently: Place 1 application onto the skin 2 (two) times daily. BOTH HIPS 07/23/18  Yes Andrena Mews, DO  diltiazem (CARDIZEM LA) 180 MG 24 hr tablet Take 180 mg by mouth daily.  02/09/18  Yes [provider]  famotidine (PEPCID) 20 MG  tablet Take 1 tablet (20 mg total) by mouth 2 (two) times daily. 05/06/16  Yes Azalee Course, PA  gabapentin (NEURONTIN) 400 MG capsule Take 400 mg by mouth 2 (two) times daily as needed (for hip pain).  05/15/18  Yes [provider]  insulin glargine (LANTUS) 100 UNIT/ML injection Inject 0.1 mLs (10 Units total) into the skin daily. Patient taking differently: Inject 40 Units into the skin at bedtime.  04/16/16  Yes Alison Murray, MD  insulin lispro (HUMALOG) 100 UNIT/ML injection Inject 5-10 Units into the skin 3 (three) times daily before meals.    Yes [provider]  Magnesium 250 MG TABS Take 250 mg by mouth 2 (two) times daily.    Yes [provider]  pravastatin (PRAVACHOL) 20 MG tablet Take 20 mg by mouth at bedtime.  02/24/18  Yes [provider]  traMADol (ULTRAM) 50 MG tablet Take 50 mg by mouth every 8 (eight) hours as needed (for pain).  07/03/18  Yes [provider]  aspirin 81 MG chewable tablet Chew 1 tablet (81 mg total) by mouth daily. Patient not taking: Reported on 08/23/2018 05/06/16   Azalee Course, PA  losartan (COZAAR) 50 MG tablet Take 1 tablet (50 mg total) by mouth daily. 05/06/16   Azalee Course, PA    Inpatient Medications: Scheduled Meds: . aspirin  81 mg Oral Daily  . carvedilol  25 mg Oral BID WC  . cloNIDine  0.2 mg Oral BID  . clopidogrel  75 mg Oral Daily  . cyclobenzaprine  10 mg Oral QHS  . diltiazem  180 mg Oral Daily  . famotidine  20 mg Oral BID  . hydrALAZINE  25 mg Oral Q8H  . insulin aspart  0-9 Units Subcutaneous Q4H  . insulin glargine  30 Units Subcutaneous QHS  . magnesium oxide  200 mg Oral BID  . potassium chloride  40 mEq Oral Once  . pravastatin  20 mg Oral QHS   Continuous Infusions: . sodium chloride 75 mL/hr at 08/23/18 2135  . heparin 1,400 Units/hr (08/24/18 0335)   PRN Meds: acetaminophen **OR** acetaminophen, gabapentin, hydrALAZINE, ondansetron **OR** ondansetron (ZOFRAN) IV  Allergies:      Allergies  Allergen Reactions  . Eggs Or Egg-Derived Products Nausea And Vomiting  . Metformin And Related Diarrhea and Other (See Comments)    Extreme gas, also    Social History:   Social History   Socioeconomic History  . Marital status: Divorced    Spouse name: Not on file  . Number of children: Not on file  . Years of education: Not on file  . Highest education level: Not on file  Occupational History  . Not on file  Social Needs  . Financial resource strain: Not on file  . Food insecurity:    Worry: Not on file    Inability: Not on file  . Transportation needs:    Medical: Not on file    Non-medical: Not on file  Tobacco Use  .  Smoking status: Former Games developer  . Smokeless tobacco: Never Used  . Tobacco comment: quit smoking in 2010  Substance and Sexual Activity  . Alcohol use: No  . Drug use: No  . Sexual activity: Yes    Birth control/protection: Post-menopausal  Lifestyle  . Physical activity:    Days per week: Not on file    Minutes per session: Not on file  . Stress: Not on file  Relationships  . Social connections:    Talks on phone: Not on file    Gets together: Not on file    Attends religious service: Not on file    Active member of club or organization: Not on file    Attends meetings of clubs or organizations: Not on file    Relationship status: Not on file  . Intimate partner violence:    Fear of current or ex partner: Not on file    Emotionally abused: Not on file    Physically abused: Not on file    Forced sexual activity: Not on file  Other Topics Concern  . Not on file  Social History Narrative  . Not on file    Family History:    Family History  Problem Relation Age of Onset  . Diabetes Mother   . Heart disease Father        onset late 48s     ROS:  Please see the history of present illness.   All other ROS reviewed and negative.     Physical Exam/Data:   Vitals:   08/23/18 2053 08/24/18 0031 08/24/18 0450 08/24/18 0509   BP: (!) 180/93 (!) 153/74 (!) 156/67   Pulse: 96 81 84   Resp: 18 16 16    Temp: 98.5 F (36.9 C) 99.1 F (37.3 C) 98.7 F (37.1 C)   TempSrc: Oral Oral Oral   SpO2: 99% 95% 100%   Weight:    92.9 kg  Height:        Intake/Output Summary (Last 24 hours) at 08/24/2018 0923 Last data filed at 08/24/2018 0630 Gross per 24 hour  Intake 2060 ml  Output 800 ml  Net 1260 ml   Last 3 Weights 08/24/2018 08/23/2018 08/23/2018  Weight (lbs) 204 lb 12.8 oz 190 lb 215 lb  Weight (kg) 92.897 kg 86.183 kg 97.523 kg     Body mass index is 35.15 kg/m.  General:  Well nourished, well developed, in no acute distress HEENT: normal Lymph: no adenopathy Neck: no JVD Endocrine:  No thryomegaly Cardiac:  normal S1, S2; RRR; no murmur  Lungs:  clear to auscultation bilaterally, no wheezing, rhonchi or rales  Abd: soft, nontender, no hepatomegaly  Ext: no edema Musculoskeletal:  No deformities, BUE and BLE strength normal and equal Skin: warm and dry  Neuro:  CNs 2-12 intact, no focal abnormalities noted Psych:  Normal affect   EKG:  The EKG was personally reviewed and demonstrates:  ST with LVH  Relevant CV Studies:  TTE: 10/17  Study Conclusions  - Left ventricle: The cavity size was normal. There was mild   concentric hypertrophy. Systolic function was mildly to   moderately reduced. The estimated ejection fraction was in the   range of 40% to 45%. Diastolic dysfunction, grade indeterminate.   Elevated filling pressures. - Regional wall motion abnormality: Akinesis of the apical inferior   and apical myocardium; severe hypokinesis of the basal inferior,   apical septal, and apical lateral myocardium; moderate   hypokinesis of the mid inferoseptal, mid  inferior, and mid   anterolateral myocardium; mild hypokinesis of the basal   anterolateral myocardium. - Mitral valve: Mildly calcified annulus. There was moderate   regurgitation. - Left atrium: The atrium was moderately  dilated.  Cath: 10/17   Prox RCA to Dist RCA lesion, 95 %stenosed.  Dist Cx lesion, 90 %stenosed.  Prox Cx lesion, 70 %stenosed.  Mid LAD to Dist LAD lesion, 99 %stenosed.  Dist LAD lesion, 99 %stenosed.  Ost 2nd Diag to 2nd Diag lesion, 90 %stenosed.  1st Diag lesion, 90 %stenosed.  Ost 3rd Mrg lesion, 99 %stenosed.  Ost 4th Mrg lesion, 70 %stenosed.  There is mild to moderate left ventricular systolic dysfunction.  LV end diastolic pressure is mildly elevated.  The left ventricular ejection fraction is 35-45% by visual estimate.   1. Severe, diffuse, heavily calcified 3 vessel disease in a typical diabetic pattern affecting branches and distal vessels. No clear culprit for unstable angina or myocardial infarction. 2. Mild to moderately decreased LV systolic function with an ejection fraction of 40% with akinesis of the apical segments. 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 However, LAD ischemia cannot be excluded given that the LAD wraps around the apex. 3. Mildly elevated left ventricular end-diastolic pressure.  Recommendations: No clear culprit for unstable angina is identified. It's possible that the patient developed flash pulmonary edema in the setting of uncontrolled hypertension with underlying three-vessel coronary artery disease. Recommend blood pressure control with nitroglycerin drip. The LVEDP was actually close to normal when her blood pressure was controlled after she was given one dose of labetalol. I'm still concerned about a possible neurologic event given her labile blood pressure. The patient has no good options for revascularization of her coronary artery disease given poor targets and diffuse disease.  Laboratory Data:  Chemistry Recent Labs  Lab 08/23/18 1328 08/24/18 0215  NA 129* 134*  K 3.7 3.4*  CL 93* 103  CO2 26 22  GLUCOSE 293* 282*  BUN 31* 27*  CREATININE 1.13* 1.21*  CALCIUM 9.3 8.3*  GFRNONAA 53* 49*   GFRAA >60 57*  ANIONGAP 10 9    Recent Labs  Lab 08/23/18 1328  PROT 7.5  ALBUMIN 3.7  AST 24  ALT 17  ALKPHOS 71  BILITOT 0.6   Hematology Recent Labs  Lab 08/23/18 1328  WBC 12.0*  RBC 5.49*  HGB 15.8*  HCT 48.8*  MCV 88.9  MCH 28.8  MCHC 32.4  RDW 12.0  PLT 311   Cardiac Enzymes Recent Labs  Lab 08/23/18 1328 08/23/18 2133 08/24/18 0215  TROPONINI 0.68* 0.52* 0.45*   No results for input(s): TROPIPOC in the last 168 hours.  BNPNo results for input(s): BNP, PROBNP in the last 168 hours.  DDimer No results for input(s): DDIMER in the last 168 hours.  Radiology/Studies:  Dg Abdomen Acute W/chest  Result Date: 08/23/2018 CLINICAL DATA:  Nausea vomiting. Weakness. EXAM: DG ABDOMEN ACUTE W/ 1V CHEST COMPARISON:  04/11/2016 FINDINGS: There is no evidence of dilated bowel loops or free intraperitoneal air. No radiopaque calculi or other significant radiographic abnormality is seen. Enlarged cardiac silhouette. Calcific atherosclerotic disease and tortuosity of the aorta. Left mid lung base scarring versus atelectasis. Chronic severe sclerotic endplate changes of L3-L4, thought to be degenerative. IMPRESSION: 1. Nonobstructive bowel gas pattern. 2. Enlarged cardiac silhouette. 3. Calcific atherosclerotic disease of the aorta. 4. Left midlung atelectasis versus scarring. Electronically Signed   By: Ted Mcalpineobrinka  Dimitrova M.D.   On: 08/23/2018 15:28    Assessment  and Plan:   Rebecca Wagner is a 58 y.o. female with a hx of CAD, systolic HF, DM, HTN, CVA who is being seen today for the evaluation of elevated troponins, n/v at the request of Dr. Clearnce Sorrel.  1. Elevated troponin: in the setting of known CAD with no targets, and not CABG candidate in 2017. She had no chest pain prior to admission. Demand ischemia in the setting of hypertensive emergency. EKG without ischemic changes. Would not recommend further ischemic work up at this time. Dc heparin  2. N/v: this has improved  since admission. Work up per primary  3. HTN: Improved with restarting home medications. Further titrated this morning. Follow trend. Consider spiro as an outpatient is cr stable given low EF.    4. IDDM: consider adding Jardiance given her known CAD.    For questions or updates, please contact [facility] Please consult [email or web address] for contact info under   Signed, Laverda Page, NP  08/24/2018 9:23 AM   I have examined the patient and reviewed assessment and plan and discussed with patient.  Agree with above as stated.  She had a GI illness with about 24 hours of vomiting.  She was not taking her antihypertensives.  I think her elevated troponin was more demand ischemia.  I do not think this represents plaque rupture MI.  I would continue to reintroduce her home blood pressure medicines.  Would like to see her blood pressure at least down to 150 systolic.  Normally, we do not use diltiazem and low ejection fraction patients.  She may have some antianginal effect from this medication so we will continue at this point.  No need for heparin.  I do not think this is acute coronary syndrome.  Lance Muss

## 2018-08-24 NOTE — Progress Notes (Addendum)
PROGRESS NOTE    Rebecca Wagner  YVO:[id number] DOB: 02/25/1961 DOA: 08/23/2018 PCP: Leilani Able, MD   Brief Narrative:  58 year old with past medical history relevant for coronary artery disease (disease with no good targets by catheterization on 04/10/2016), systolic and diastolic heart failure (EF of 40 to 45% with wall motion abnormalities by echo on 04/13/2016), type 2 diabetes, hypertension, history of CVA admitted with recalcitrant nausea, vomiting found to have hypertensive emergency with elevated troponins.   Assessment & Plan:   Principal Problem:   Hypertensive emergency Active Problems:   Multiple vessel coronary artery disease   Type 2 diabetes mellitus with vascular disease (HCC)   ARF (acute renal failure) (HCC)   Elevated troponin   Hypertensive urgency   #) Nausea/vomiting: Unclear if related to diabetic gastroparesis versus hypertensive emergency.  Clinically patient is improving.  She is requesting a diet. -Continue antiemetics - Discontinue IV fluids - Clear liquid diet  #) Hypertensive emergency with elevated troponins: Patient did not have any chest pain.  It is unclear if this nausea and vomiting was an anginal equivalent.  Troponins are now downtrending. -We will defer to cardiology about heparin drip - New clonidine 0.2 mg twice daily -Continue carvedilol 25 mg twice daily -Start hydralazine 25 mg 3 times daily -Continue diltiazem 180 mg daily -Hold candesartan-HCTZ -Hold losartan  #) AKI: Mild -Hold nephrotoxins  #) Hyperlipidemia/coronary artery disease: Per catheterization 2017 patient was not considered to have any good targets. -Heparin drip per above -Cardiology consult -Continue beta-blocker -Holding ARB -Continue clopidogrel 75 mg daily -Continue aspirin 81 mg daily -Continue pravastatin 20 mg nightly  #) Chronic systolic and diastolic heart failure: Currently patient appears to be euvolemic. -Continue beta-blocker, holding  ARB  #) Type 2 diabetes: -Hold mealtime insulin -Sliding scale insulin, AC at bedtime - Continue glargine 30 units nightly, on 40 units at home  #) History of CVA: -Continue aspirin and clopidogrel -Continue statin  #) Pain/psych: -Continue gabapentin 400 mg twice daily as needed  Fluids: Tolerating p.o. Electrolytes: Monitor and supplement Nutrition: Clear liquid diet, advance as tolerated  Prophylaxis: Heparin drip  Disposition: Pending cardiology eval and tolerating p.o.  Full code   Consultants:   Cardiology  Procedures:   None  Antimicrobials:   None   Subjective: This morning the patient reports that she is feeling somewhat better.  She denies any nausea, vomiting, diarrhea, cough, congestion, rhinorrhea.  Objective: Vitals:   08/23/18 2053 08/24/18 0031 08/24/18 0450 08/24/18 0509  BP: (!) 180/93 (!) 153/74 (!) 156/67   Pulse: 96 81 84   Resp: 18 16 16    Temp: 98.5 F (36.9 C) 99.1 F (37.3 C) 98.7 F (37.1 C)   TempSrc: Oral Oral Oral   SpO2: 99% 95% 100%   Weight:    92.9 kg  Height:        Intake/Output Summary (Last 24 hours) at 08/24/2018 0828 Last data filed at 08/24/2018 0630 Gross per 24 hour  Intake 2060 ml  Output 800 ml  Net 1260 ml   Filed Weights   08/23/18 1220 08/23/18 1857 08/24/18 0509  Weight: 97.5 kg 86.2 kg 92.9 kg    Examination:  General exam: Appears calm and comfortable  Respiratory system: Clear to auscultation. Respiratory effort normal. Cardiovascular system: Regular rate and rhythm, no murmurs Gastrointestinal system: Soft, nondistended, no rebound or guarding, plus bowel sounds Central nervous system: Alert and oriented.  Grossly intact, moving all extremities Extremities: No lower extremity edema Skin: No rashes  over visible skin Psychiatry: Judgement and insight appear normal. Mood & affect appropriate.     Data Reviewed: I have personally reviewed following labs and imaging studies  CBC: Recent  Labs  Lab 08/23/18 1328  WBC 12.0*  HGB 15.8*  HCT 48.8*  MCV 88.9  PLT 311   Basic Metabolic Panel: Recent Labs  Lab 08/23/18 1328 08/24/18 0215  NA 129* 134*  K 3.7 3.4*  CL 93* 103  CO2 26 22  GLUCOSE 293* 282*  BUN 31* 27*  CREATININE 1.13* 1.21*  CALCIUM 9.3 8.3*   GFR: Estimated Creatinine Clearance: 55.3 mL/min (A) (by C-G formula based on SCr of 1.21 mg/dL (H)). Liver Function Tests: Recent Labs  Lab 08/23/18 1328  AST 24  ALT 17  ALKPHOS 71  BILITOT 0.6  PROT 7.5  ALBUMIN 3.7   Recent Labs  Lab 08/23/18 1328  LIPASE 25   No results for input(s): AMMONIA in the last 168 hours. Coagulation Profile: No results for input(s): INR, PROTIME in the last 168 hours. Cardiac Enzymes: Recent Labs  Lab 08/23/18 1328 08/23/18 2133 08/24/18 0215  TROPONINI 0.68* 0.52* 0.45*   BNP (last 3 results) No results for input(s): PROBNP in the last 8760 hours. HbA1C: No results for input(s): HGBA1C in the last 72 hours. CBG: Recent Labs  Lab 08/23/18 2051 08/24/18 0217 08/24/18 0448 08/24/18 0750  GLUCAP 220* 267* 211* 188*   Lipid Profile: No results for input(s): CHOL, HDL, LDLCALC, TRIG, CHOLHDL, LDLDIRECT in the last 72 hours. Thyroid Function Tests: No results for input(s): TSH, T4TOTAL, FREET4, T3FREE, THYROIDAB in the last 72 hours. Anemia Panel: No results for input(s): VITAMINB12, FOLATE, FERRITIN, TIBC, IRON, RETICCTPCT in the last 72 hours. Sepsis Labs: No results for input(s): PROCALCITON, LATICACIDVEN in the last 168 hours.  No results found for this or any previous visit (from the past 240 hour(s)).       Radiology Studies: Dg Abdomen Acute W/chest  Result Date: 08/23/2018 CLINICAL DATA:  Nausea vomiting. Weakness. EXAM: DG ABDOMEN ACUTE W/ 1V CHEST COMPARISON:  04/11/2016 FINDINGS: There is no evidence of dilated bowel loops or free intraperitoneal air. No radiopaque calculi or other significant radiographic abnormality is seen.  Enlarged cardiac silhouette. Calcific atherosclerotic disease and tortuosity of the aorta. Left mid lung base scarring versus atelectasis. Chronic severe sclerotic endplate changes of L3-L4, thought to be degenerative. IMPRESSION: 1. Nonobstructive bowel gas pattern. 2. Enlarged cardiac silhouette. 3. Calcific atherosclerotic disease of the aorta. 4. Left midlung atelectasis versus scarring. Electronically Signed   By: Ted Mcalpineobrinka  Dimitrova M.D.   On: 08/23/2018 15:28        Scheduled Meds: . aspirin  81 mg Oral Daily  . carvedilol  25 mg Oral BID WC  . cloNIDine  0.2 mg Oral BID  . clopidogrel  75 mg Oral Daily  . cyclobenzaprine  10 mg Oral QHS  . diltiazem  180 mg Oral Daily  . famotidine  20 mg Oral BID  . insulin aspart  0-9 Units Subcutaneous Q4H  . insulin glargine  30 Units Subcutaneous QHS  . magnesium oxide  200 mg Oral BID  . potassium chloride  40 mEq Oral Once  . pravastatin  20 mg Oral QHS   Continuous Infusions: . sodium chloride 75 mL/hr at 08/23/18 2135  . heparin 1,400 Units/hr (08/24/18 0335)     LOS: 0 days    Time spent: 35    Delaine LameShrey C Azjah Pardo, MD [facility]  If 7PM-7AM, please contact  night-coverage [email or web address] Password [facility] 08/24/2018, 8:28 AM

## 2018-08-25 LAB — CBC
HCT: 37.5 % (ref 36.0–46.0)
Hemoglobin: 12.4 g/dL (ref 12.0–15.0)
MCH: 29.2 pg (ref 26.0–34.0)
MCHC: 33.1 g/dL (ref 30.0–36.0)
MCV: 88.4 fL (ref 80.0–100.0)
Platelets: 256 10*3/uL (ref 150–400)
RBC: 4.24 MIL/uL (ref 3.87–5.11)
RDW: 12.2 % (ref 11.5–15.5)
WBC: 7.6 10*3/uL (ref 4.0–10.5)
nRBC: 0 % (ref 0.0–0.2)

## 2018-08-25 LAB — BASIC METABOLIC PANEL
BUN: 16 mg/dL (ref 6–20)
Calcium: 8.5 mg/dL — ABNORMAL LOW (ref 8.9–10.3)
Chloride: 107 mmol/L (ref 98–111)
Creatinine, Ser: 0.89 mg/dL (ref 0.44–1.00)
GFR calc Af Amer: >60 mL/min (ref >60)
GFR calc non Af Amer: >60 mL/min (ref >60)
Glucose, Bld: 131 mg/dL — ABNORMAL HIGH (ref 70–99)
Potassium: 3.4 mmol/L — ABNORMAL LOW (ref 3.5–5.1)
Sodium: 137 mmol/L (ref 135–145)

## 2018-08-25 LAB — BASIC METABOLIC PANEL WITH GFR
Anion gap: 9 (ref 5–15)
CO2: 21 mmol/L — ABNORMAL LOW (ref 22–32)

## 2018-08-25 LAB — MAGNESIUM: Magnesium: 2 mg/dL (ref 1.7–2.4)

## 2018-08-25 LAB — GLUCOSE, CAPILLARY
Glucose-Capillary: 127 mg/dL — ABNORMAL HIGH (ref 70–99)
Glucose-Capillary: 167 mg/dL — ABNORMAL HIGH (ref 70–99)

## 2018-08-25 MED ORDER — ONDANSETRON HCL 4 MG PO TABS: 4.0000 mg | ORAL_TABLET | Freq: Four times a day (QID) | ORAL | 0 refills | Status: AC | PRN

## 2018-08-25 MED ORDER — POTASSIUM CHLORIDE CRYS ER 20 MEQ PO TBCR
40.0000 meq | EXTENDED_RELEASE_TABLET | Freq: Once | ORAL | Status: AC
  Administered 2018-08-25: 40 meq via ORAL
  Filled 2018-08-25: qty 2

## 2018-08-25 MED ORDER — HYDRALAZINE HCL 25 MG PO TABS: 25.0000 mg | ORAL_TABLET | Freq: Three times a day (TID) | ORAL | 0 refills | Status: AC

## 2018-08-25 MED ORDER — CLONIDINE HCL 0.2 MG PO TABS: 0.2000 mg | ORAL_TABLET | Freq: Two times a day (BID) | ORAL | 0 refills | Status: AC

## 2018-08-25 MED FILL — hydrALAZINE HCL 25 MG TABS: 25 | 30 days supply | Qty: 90 | Fill #0

## 2018-08-25 MED FILL — ONDANSETRON HCL 4 MG TABLET: 4 | 5 days supply | Qty: 20 | Fill #0

## 2018-08-25 NOTE — Discharge Summary (Signed)
Physician Discharge Summary  Rebecca Wagner OZH:[id number] DOB: 11/25/1960 DOA: 08/23/2018  PCP: Leilani Able, MD  Admit date: 08/23/2018 Discharge date: 08/25/2018  Admitted From: Home Disposition:  Home  Recommendations for Outpatient Follow-up:  1. Follow up with PCP in 1-2 weeks 2. Please obtain BMP/CBC in one week   Home Health: No Equipment/Devices:none  Discharge Condition: stable CODE STATUS: FULL Diet recommendation: Heart Healthy / Carb Modified    Brief/Interim Summary:  #) Nausea and vomiting: Patient was admitted with nausea and vomiting.  She was given a clear liquid diet and antiemetics.  This improved dramatically.  Patient was sent home with oral Zofran.  This was felt to be likely secondary to viral gastroenteritis.  #) Hypertensive emergency: Patient was admitted with extreme hypertension elevated troponins.  Initially she required IV pushes of medications and then was transitioned to her home oral doses.  Her home antihypertensives were adjusted including starting hydralazine 25 mg 3 times daily as well as increasing her clonidine to twice daily.  She was continued on her home diltiazem, carvedilol.  Initially her candesartan-HCTZ were held due to AKI but may be restarted on discharge.  #) AKI: This resolved with control of blood pressure.  #) Hypertension/hyperlipidemia/coronary artery disease/elevated troponins: Patient was admitted with elevated troponins and is a history of coronary artery disease with cardiac catheterization in 2017 that showed no good targets for intervention and no good options for CABG.  Patient initially was on heparin drip.  Cardiology was consulted and felt that this patient did not have ischemic EKG that the cause of patient's elevated troponin was likely poorly controlled hypertension.  Patient was continued on clopidogrel, aspirin, carvedilol, pravastatin.  #) Type 2 diabetes: Patient was continued on lower dose glargine and sliding  scale insulin.  She may restart her home regimen on discharge.  #) Pain/psych: Patient was continued on home cyclobenzaprine, topical diclofenac, gabapentin.  Discharge Diagnoses:  Principal Problem:   Hypertensive emergency Active Problems:   Multiple vessel coronary artery disease   Type 2 diabetes mellitus with vascular disease (HCC)   ARF (acute renal failure) (HCC)   Elevated troponin   Hypertensive urgency    Discharge Instructions  Discharge Instructions    Call MD for:  difficulty breathing, headache or visual disturbances   Complete by:  As directed    Call MD for:  extreme fatigue   Complete by:  As directed    Call MD for:  hives   Complete by:  As directed    Call MD for:  persistant dizziness or light-headedness   Complete by:  As directed    Call MD for:  persistant nausea and vomiting   Complete by:  As directed    Call MD for:  redness, tenderness, or signs of infection (pain, swelling, redness, odor or green/yellow discharge around incision site)   Complete by:  As directed    Call MD for:  severe uncontrolled pain   Complete by:  As directed    Call MD for:  temperature >100.4   Complete by:  As directed    Diet - low sodium heart healthy   Complete by:  As directed    Discharge instructions   Complete by:  As directed    Please follow-up with your primary care doctor in 1 to 2 weeks.   Increase activity slowly   Complete by:  As directed      Allergies as of 08/25/2018      Reactions  Eggs Or Egg-derived Products Nausea And Vomiting   Metformin And Related Diarrhea, Other (See Comments)   Extreme gas, also      Medication List    STOP taking these medications   losartan 50 MG tablet Commonly known as:  COZAAR     TAKE these medications   acetaminophen 325 MG tablet Commonly known as:  TYLENOL Take 2 tablets (650 mg total) by mouth every 6 (six) hours as needed for fever. What changed:  reasons to take this   aspirin 81 MG chewable  tablet Chew 1 tablet (81 mg total) by mouth daily.   calcium carbonate 1500 (600 Ca) MG Tabs tablet Commonly known as:  OSCAL Take 600 mg of elemental calcium by mouth daily.   Candesartan Cilexetil-HCTZ 32-25 MG Tabs Take 0.5 mg by mouth daily.   carvedilol 25 MG tablet Commonly known as:  COREG Take 25 mg by mouth 2 (two) times daily with a meal.   cloNIDine 0.2 MG tablet Commonly known as:  CATAPRES Take 1 tablet (0.2 mg total) by mouth 2 (two) times daily for 30 days. What changed:  when to take this   clopidogrel 75 MG tablet Commonly known as:  PLAVIX Take 1 tablet (75 mg total) daily by mouth. NEED OV. What changed:  additional instructions   cyclobenzaprine 10 MG tablet Commonly known as:  FLEXERIL Take 1 tablet (10 mg total) by mouth at bedtime.   Diclofenac Sodium 2 % Soln Commonly known as:  PENNSAID Place 1 application onto the skin 2 (two) times daily. What changed:  additional instructions   diltiazem 180 MG 24 hr tablet Commonly known as:  CARDIZEM LA Take 180 mg by mouth daily.   famotidine 20 MG tablet Commonly known as:  PEPCID Take 1 tablet (20 mg total) by mouth 2 (two) times daily.   gabapentin 400 MG capsule Commonly known as:  NEURONTIN Take 400 mg by mouth 2 (two) times daily as needed (for hip pain).   hydrALAZINE 25 MG tablet Commonly known as:  APRESOLINE Take 1 tablet (25 mg total) by mouth every 8 (eight) hours for 30 days.   insulin glargine 100 UNIT/ML injection Commonly known as:  LANTUS Inject 0.1 mLs (10 Units total) into the skin daily. What changed:    how much to take  when to take this   insulin lispro 100 UNIT/ML injection Commonly known as:  HUMALOG Inject 5-10 Units into the skin 3 (three) times daily before meals.   Magnesium 250 MG Tabs Take 250 mg by mouth 2 (two) times daily.   ondansetron 4 MG tablet Commonly known as:  ZOFRAN Take 1 tablet (4 mg total) by mouth every 6 (six) hours as needed for nausea.    pravastatin 20 MG tablet Commonly known as:  PRAVACHOL Take 20 mg by mouth at bedtime.   traMADol 50 MG tablet Commonly known as:  ULTRAM Take 50 mg by mouth every 8 (eight) hours as needed (for pain).      Follow-up Information    Leilani Able, MD. Go on 08/27/2018.   Specialty:  Family Medicine Why:  @3 :30pm Contact information: [address] [place] [address] [phone]          Allergies  Allergen Reactions  . Eggs Or Egg-Derived Products Nausea And Vomiting  . Metformin And Related Diarrhea and Other (See Comments)    Extreme gas, also    Consultations:  Cardiology   Procedures/Studies: Dg Abdomen Acute W/chest  Result Date:  08/23/2018 CLINICAL DATA:  Nausea vomiting. Weakness. EXAM: DG ABDOMEN ACUTE W/ 1V CHEST COMPARISON:  04/11/2016 FINDINGS: There is no evidence of dilated bowel loops or free intraperitoneal air. No radiopaque calculi or other significant radiographic abnormality is seen. Enlarged cardiac silhouette. Calcific atherosclerotic disease and tortuosity of the aorta. Left mid lung base scarring versus atelectasis. Chronic severe sclerotic endplate changes of L3-L4, thought to be degenerative. IMPRESSION: 1. Nonobstructive bowel gas pattern. 2. Enlarged cardiac silhouette. 3. Calcific atherosclerotic disease of the aorta. 4. Left midlung atelectasis versus scarring. Electronically Signed   By: Ted Mcalpine M.D.   On: 08/23/2018 15:28      Subjective:   Discharge Exam: Vitals:   08/25/18 0501 08/25/18 0801  BP: (!) 165/75 (!) 153/79  Pulse: 74 73  Resp: 16   Temp: 98.9 F (37.2 C)   SpO2: 98% 99%   Vitals:   08/25/18 0458 08/25/18 0500 08/25/18 0501 08/25/18 0801  BP: (!) 165/75 (!) 165/75 (!) 165/75 (!) 153/79  Pulse: 74  74 73  Resp:   16   Temp: 98.9 F (37.2 C)  98.9 F (37.2 C)   TempSrc: Oral  Oral   SpO2: 98%  98% 99%  Weight:   93.4 kg   Height:         General exam: Appears calm and comfortable   Respiratory system: Clear to auscultation. Respiratory effort normal. Cardiovascular system: Regular rate and rhythm, no murmurs Gastrointestinal system: Soft, nondistended, no rebound or guarding, plus bowel sounds Central nervous system: Alert and oriented.  Grossly intact, moving all extremities Extremities: No lower extremity edema Skin: No rashes over visible skin Psychiatry: Judgement and insight appear normal. Mood & affect appropriate    The results of significant diagnostics from this hospitalization (including imaging, microbiology, ancillary and laboratory) are listed below for reference.     Microbiology: No results found for this or any previous visit (from the past 240 hour(s)).   Labs: BNP (last 3 results) No results for input(s): BNP in the last 8760 hours. Basic Metabolic Panel: Recent Labs  Lab 08/23/18 1328 08/24/18 0215 08/25/18 0504  NA 129* 134* 137  K 3.7 3.4* 3.4*  CL 93* 103 107  CO2 26 22 21*  GLUCOSE 293* 282* 131*  BUN 31* 27* 16  CREATININE 1.13* 1.21* 0.89  CALCIUM 9.3 8.3* 8.5*  MG  --   --  2.0   Liver Function Tests: Recent Labs  Lab 08/23/18 1328 08/24/18 0913  AST 24 21  ALT 17 17  ALKPHOS 71 47  BILITOT 0.6 0.8  PROT 7.5 6.2*  ALBUMIN 3.7 2.6*   Recent Labs  Lab 08/23/18 1328  LIPASE 25   No results for input(s): AMMONIA in the last 168 hours. CBC: Recent Labs  Lab 08/23/18 1328 08/25/18 0504  WBC 12.0* 7.6  HGB 15.8* 12.4  HCT 48.8* 37.5  MCV 88.9 88.4  PLT 311 256   Cardiac Enzymes: Recent Labs  Lab 08/23/18 1328 08/23/18 2133 08/24/18 0215 08/24/18 0913  TROPONINI 0.68* 0.52* 0.45* 0.48*   BNP: Invalid input(s): POCBNP CBG: Recent Labs  Lab 08/24/18 1159 08/24/18 1652 08/24/18 2144 08/24/18 2328 08/25/18 0453  GLUCAP 202* 174* 210* 193* 127*   D-Dimer No results for input(s): DDIMER in the last 72 hours. Hgb A1c No results for input(s): HGBA1C in the last 72 hours. Lipid Profile No  results for input(s): CHOL, HDL, LDLCALC, TRIG, CHOLHDL, LDLDIRECT in the last 72 hours. Thyroid function studies No results for  input(s): TSH, T4TOTAL, T3FREE, THYROIDAB in the last 72 hours.  Invalid input(s): FREET3 Anemia work up No results for input(s): VITAMINB12, FOLATE, FERRITIN, TIBC, IRON, RETICCTPCT in the last 72 hours. Urinalysis    Component Value Date/Time   COLORURINE YELLOW 04/10/2016 1824   APPEARANCEUR CLEAR 04/10/2016 1824   LABSPEC 1.020 04/10/2016 1824   PHURINE 5.5 04/10/2016 1824   GLUCOSEU 250 (A) 04/10/2016 1824   HGBUR NEGATIVE 04/10/2016 1824   BILIRUBINUR NEGATIVE 04/10/2016 1824   BILIRUBINUR large (A) 02/28/2016 1820   KETONESUR NEGATIVE 04/10/2016 1824   PROTEINUR NEGATIVE 04/10/2016 1824   UROBILINOGEN 1.0 02/28/2016 1820   NITRITE NEGATIVE 04/10/2016 1824   LEUKOCYTESUR NEGATIVE 04/10/2016 1824   Sepsis Labs Invalid input(s): PROCALCITONIN,  WBC,  LACTICIDVEN Microbiology No results found for this or any previous visit (from the past 240 hour(s)).   Time coordinating discharge: 35  SIGNED:   Delaine Lame, MD  [facility] 08/25/2018, 9:17 AM   If 7PM-7AM, please contact night-coverage [email or web address] Password [email or web address]

## 2018-08-25 NOTE — Progress Notes (Signed)
Discharge and medication education was given with teach back. Prescriptions given and belongings with patient. Peripheral IV removed clean dry and intact, pressure and dressing applied. Patients questions and concerns were answered.  Family member awaiting for patient. Nursing student took pt in wheelchair with belongings and Discharge packet in hand. Pt denies chest pain or shortness of breath.CCMD called.

## 2018-08-25 NOTE — Discharge Instructions (Signed)
Preventing Hypertension  Hypertension, commonly called high blood pressure, is when the force of blood pumping through the arteries is too strong. Arteries are blood vessels that carry blood from the heart throughout the body. Over time, hypertension can damage the arteries and decrease blood flow to important parts of the body, including the brain, heart, and kidneys. Often, hypertension does not cause symptoms until blood pressure is very high. For this reason, it is important to have your blood pressure checked on a regular basis.  Hypertension can often be prevented with diet and lifestyle changes. If you already have hypertension, you can control it with diet and lifestyle changes, as well as medicine.  What nutrition changes can be made?  Maintain a healthy diet. This includes:   Eating less salt (sodium). Ask your health care provider how much sodium is safe for you to have. The general recommendation is to consume less than 1 tsp (2,300 mg) of sodium a day.  ? Do not add salt to your food.  ? Choose low-sodium options when grocery shopping and eating out.   Limiting fats in your diet. You can do this by eating low-fat or fat-free dairy products and by eating less red meat.   Eating more fruits, vegetables, and whole grains. Make a goal to eat:  ? 1-2 cups of fresh fruits and vegetables each day.  ? 3-4 servings of whole grains each day.   Avoiding foods and beverages that have added sugars.   Eating fish that contain healthy fats (omega-3 fatty acids), such as mackerel or salmon.  If you need help putting together a healthy eating plan, try the DASH diet. This diet is high in fruits, vegetables, and whole grains. It is low in sodium, red meat, and added sugars. DASH stands for Dietary Approaches to Stop Hypertension.  What lifestyle changes can be made?     Lose weight if you are overweight. Losing just 3?5% of your body weight can help prevent or control hypertension.  ? For example, if your present  weight is 200 lb (91 kg), a loss of 3-5% of your weight means losing 6-10 lb (2.7-4.5 kg).  ? Ask your health care provider to help you with a diet and exercise plan to safely lose weight.   Get enough exercise. Do at least 150 minutes of moderate-intensity exercise each week.  ? You could do this in short exercise sessions several times a day, or you could do longer exercise sessions a few times a week. For example, you could take a brisk 10-minute walk or bike ride, 3 times a day, for 5 days a week.   Find ways to reduce stress, such as exercising, meditating, listening to music, or taking a yoga class. If you need help reducing stress, ask your health care provider.   Do not smoke. This includes e-cigarettes. Chemicals in tobacco and nicotine products raise your blood pressure each time you smoke. If you need help quitting, ask your health care provider.   Avoid alcohol. If you drink alcohol, limit alcohol intake to no more than 1 drink a day for nonpregnant women and 2 drinks a day for men. One drink equals 12 oz of beer, 5 oz of wine, or 1 oz of hard liquor.  Why are these changes important?  Diet and lifestyle changes can help you prevent hypertension, and they may make you feel better overall and improve your quality of life. If you have hypertension, making these changes will help you control   it and help prevent major complications, such as:   Hardening and narrowing of arteries that supply blood to:  ? Your heart. This can cause a heart attack.  ? Your brain. This can cause a stroke.  ? Your kidneys. This can cause kidney failure.   Stress on your heart muscle, which can cause heart failure.  What can I do to lower my risk?   Work with your health care provider to make a hypertension prevention plan that works for you. Follow your plan and keep all follow-up visits as told by your health care provider.   Learn how to check your blood pressure at home. Make sure that you know your personal target  blood pressure, as told by your health care provider.  How is this treated?  In addition to diet and lifestyle changes, your health care provider may recommend medicines to help lower your blood pressure. You may need to try a few different medicines to find what works best for you. You also may need to take more than one medicine. Take over-the-counter and prescription medicines only as told by your health care provider.  Where to find support  Your health care provider can help you prevent hypertension and help you keep your blood pressure at a healthy level. Your local hospital or your community may also provide support services and prevention programs.  The American Heart Association offers an online support network at: http://supportnetwork.heart.org/high-blood-pressure  Where to find more information  Learn more about hypertension from:   National Heart, Lung, and Blood Institute: www.nhlbi.nih.gov/health/health-topics/topics/hbp   Centers for Disease Control and Prevention: www.cdc.gov/bloodpressure   American Academy of Family Physicians: http://familydoctor.org/familydoctor/en/diseases-conditions/high-blood-pressure.printerview.all.html  Learn more about the DASH diet from:   National Heart, Lung, and Blood Institute: www.nhlbi.nih.gov/health/health-topics/topics/dash  Contact a health care provider if:   You think you are having a reaction to medicines you have taken.   You have recurrent headaches or feel dizzy.   You have swelling in your ankles.   You have trouble with your vision.  Summary   Hypertension often does not cause any symptoms until blood pressure is very high. It is important to get your blood pressure checked regularly.   Diet and lifestyle changes are the most important steps in preventing hypertension.   By keeping your blood pressure in a healthy range, you can prevent complications like heart attack, heart failure, stroke, and kidney failure.   Work with your health care  provider to make a hypertension prevention plan that works for you.  This information is not intended to replace advice given to you by your health care provider. Make sure you discuss any questions you have with your health care provider.  Document Released: 07/02/2015 Document Revised: 02/26/2016 Document Reviewed: 02/26/2016  Elsevier Interactive Patient Education  2019 Elsevier Inc.

## 2018-08-26 ENCOUNTER — Other Ambulatory Visit: Payer: Self-pay | Admitting: *Deleted

## 2018-08-26 ENCOUNTER — Ambulatory Visit: Payer: 59 | Admitting: Physical Therapy

## 2018-08-26 NOTE — Patient Outreach (Signed)
[facility]) Care Management  08/26/2018  [name] Dec 09, 1960 [id number]  Transition of care telephone call  Referral received: 08/26/18 Initial outreach: 08/26/18 Insurance: [facility] Health Choice Plan   Subjective: Initial unsuccessful telephone call to patient's contact number. Unable to leave message because voice mail box was full.   Objective: Per the electronic medical record, Rebecca Wagner  was hospitalized at [facility] from 2/23-2/25/20  for nausea and vomiting and hypertensive crisis causing acute kidney injury.  Comorbidities include: HTN, history of stroke, multiple vessel CAD, Type 2 DM- on  insulin, gait disorder, hyperlipidemia. She was discharged to home on 08/25/18 without the need for home health services or durable medical equipment per the discharge summary.   Plan: This RNCM will route unsuccessful outreach letter with [facility] Care Management pamphlet and 24 hour Nurse Advice Line Magnet to [employer] Care Management clinical pool to be mailed to patient's home address. This RNCM will attempt another outreach within 4 business days.  Bary Richard RN,CCM,CDE [facility] Care Management Coordinator Office Phone [phone] Office Fax [phone]

## 2018-08-27 ENCOUNTER — Other Ambulatory Visit: Payer: Self-pay | Admitting: *Deleted

## 2018-08-27 DIAGNOSIS — E1121 Type 2 diabetes mellitus with diabetic nephropathy: Secondary | ICD-10-CM | POA: Diagnosis not present

## 2018-08-27 DIAGNOSIS — Z8673 Personal history of transient ischemic attack (TIA), and cerebral infarction without residual deficits: Secondary | ICD-10-CM | POA: Diagnosis not present

## 2018-08-27 DIAGNOSIS — R279 Unspecified lack of coordination: Secondary | ICD-10-CM | POA: Diagnosis not present

## 2018-08-27 DIAGNOSIS — Z87891 Personal history of nicotine dependence: Secondary | ICD-10-CM | POA: Diagnosis not present

## 2018-08-27 DIAGNOSIS — I10 Essential (primary) hypertension: Secondary | ICD-10-CM | POA: Diagnosis not present

## 2018-08-27 MED FILL — AMOXICILLIN 500 MG CAPSULE: 500 | 5 days supply | Qty: 15 | Fill #0

## 2018-08-27 MED FILL — ONDANSETRON HCL 8 MG TABLET: 8 | 10 days supply | Qty: 30 | Fill #0

## 2018-08-27 NOTE — Patient Outreach (Signed)
[facility]) Care Management  08/27/2018  Rebecca Wagner April 26, 1961 [id number]   Transition of care telephone call  Referral received: 08/26/18 Initial outreach: 08/26/18 Insurance: [facility] Choice Plan    Second unsuccessful telephone call to patient's contact number. Unlike first attempt, was able to leave HIPAA compliant message requesting return call.   Objective: Per the electronic medical record, Rebecca Wagner  was hospitalized at [facility] from 2/23-2/25/20  for nausea and vomiting and hypertensive crisis causing acute kidney injury.  Comorbidities include: HTN, history of stroke, multiple vessel CAD, Type 2 DM- on  insulin, gait disorder, hyperlipidemia. She was discharged to home on 08/25/18 without the need for home health services or durable medical equipment per the discharge summary.   Plan: If no return call from patient, this RNCM will attempt a third and final outreach within 4 business days, if no return call from patient, will close case to [facility] Care Management services in 10 business days after initial outreach.   Bary Richard RN,CCM,CDE [facility] Care Management Coordinator Office Phone [phone] Office Fax [phone]

## 2018-08-31 ENCOUNTER — Ambulatory Visit: Payer: Self-pay | Admitting: *Deleted

## 2018-08-31 ENCOUNTER — Other Ambulatory Visit: Payer: Self-pay | Admitting: *Deleted

## 2018-08-31 ENCOUNTER — Ambulatory Visit: Payer: 59 | Admitting: Physical Therapy

## 2018-08-31 NOTE — Patient Outreach (Signed)
[facility] Care Management  08/31/2018  Rebecca Wagner 1960-08-17 [id number]   Transition of care call  Referral received: 08/26/18 Initial outreach attempt: 08/26/18 Insurance: [facility] Choice Plan  Third unsuccessful telephone call to patient's preferred (home) number in order to complete transition of care assessment; no answer, left HIPAA compliant message requesting return call.   Objective: Per the electronic medical record,Re[facility]dwellwas [facility] from[facility]/23-2/25/65fornausea and vomiting and hypertensive crisis causing acute kidney injury. Comorbidities include:HTN, history of stroke, multiple vessel CAD, Type 2 DM- on insulin, gait disorder, hyperlipidemia. She was discharged to home on 08/25/18 without the need for home health services or durable medical equipment per the discharge summary.   Plan: If no return call from patient, will close case to [facility] Care Management services in 10 business days after initial post hospital discharge outreach. ( 09/08/18)

## 2018-09-02 ENCOUNTER — Ambulatory Visit: Payer: 59 | Attending: Nurse Practitioner | Admitting: Physical Therapy

## 2018-09-03 ENCOUNTER — Other Ambulatory Visit: Payer: Self-pay | Admitting: *Deleted

## 2018-09-03 ENCOUNTER — Ambulatory Visit: Payer: 59 | Admitting: Sports Medicine

## 2018-09-03 NOTE — Patient Outreach (Signed)
[facility]) Care Management  09/03/2018  Rebecca Wagner 26-Feb-1961 [id number]  Transition of care telephone call  Referral received: 08/26/18 Initial outreach: 08/26/18 Insurance: [place] Choice Plan   Third unsuccessful telephone call to patient's preferred (home) number in order to complete transition of care assessment; no answer, left HIPAA compliant message requesting return call.   Objective:  Per the electronic medical record, Rebecca Wagner was hospitalized at [facility] from 2/23-2/25/20  for nausea and vomiting and hypertensive crisis causing acute kidney injury.  Comorbidities include: HTN, history of stroke, multiple vessel CAD, Type 2 DM- on  insulin, gait disorder, hyperlipidemia. She was discharged to home on 08/25/18 without the need for home health services or durable medical equipment per the discharge summary.   Plan: If no return call from patient, will close case to [facility] Care Management services in 10 business days after initial post hospital discharge outreach (09/08/18).   Bary Richard RN,CCM,CDE [facility] Care Management Coordinator Office Phone [phone] Office Fax [phone]

## 2018-09-07 ENCOUNTER — Ambulatory Visit: Payer: 59 | Admitting: Physical Therapy

## 2018-09-08 ENCOUNTER — Other Ambulatory Visit: Payer: Self-pay | Admitting: *Deleted

## 2018-09-08 NOTE — Patient Outreach (Signed)
[facility]) Care Management  09/08/2018  GIANNE ABSHIER [birth year] [id number]   Transition of Care Case Closure- Unsuccessful Outreach Referral received:08/26/18 Initial outreach:08/26/18 [facility]  Objective: Per the electronic medical record,Rebecca Caldwellwas hospitalized [facility] from2/23-2/25/97fornausea and vomiting and hypertensive crisis causing acute kidney injury. Comorbidities include:HTN, history of stroke, multiple vessel CAD, Type 2 DM- on insulin, gait disorder, hyperlipidemia. She was discharged to home on 08/25/18 without the need for home health services or durable medical equipment per the discharge summary.   Assessment: Unable to complete post hospital discharge transition of care assessment; no return call from patient after 3 attempts and no response to request to contact this RNCM in unsuccessful outreach letter.   Plan: Case closed to [facility] Care Management services as it has been 10 business days since initial post hospital discharge outreach attempt.  Bary Richard RN,CCM,CDE [facility] Care Management Coordinator Office Phone [phone] Office Fax [phone]

## 2018-09-09 ENCOUNTER — Ambulatory Visit: Payer: 59 | Admitting: Physical Therapy

## 2018-09-09 MED FILL — CYCLOBENZAPRINE 10 MG TAB: 10 | 90 days supply | Qty: 90 | Fill #1

## 2018-09-09 MED FILL — CARVEDILOL 25 MG TABLET: 25 | 90 days supply | Qty: 180 | Fill #1

## 2018-09-11 MED FILL — FREESTYLE LIBRE 14 DAY SENS: 28 days supply | Qty: 2 | Fill #1

## 2018-09-21 ENCOUNTER — Ambulatory Visit: Payer: 59 | Admitting: Sports Medicine

## 2018-09-22 ENCOUNTER — Ambulatory Visit: Payer: 59 | Admitting: Sports Medicine

## 2018-09-22 ENCOUNTER — Other Ambulatory Visit: Payer: Self-pay

## 2018-09-22 ENCOUNTER — Encounter: Payer: Self-pay | Admitting: Sports Medicine

## 2018-09-22 VITALS — BP 148/86 | HR 72 | Ht 64.0 in | Wt 211.6 lb

## 2018-09-22 DIAGNOSIS — R262 Difficulty in walking, not elsewhere classified: Secondary | ICD-10-CM | POA: Diagnosis not present

## 2018-09-22 DIAGNOSIS — R29898 Other symptoms and signs involving the musculoskeletal system: Secondary | ICD-10-CM

## 2018-09-22 DIAGNOSIS — M25559 Pain in unspecified hip: Secondary | ICD-10-CM | POA: Diagnosis not present

## 2018-09-22 DIAGNOSIS — R269 Unspecified abnormalities of gait and mobility: Secondary | ICD-10-CM

## 2018-09-22 NOTE — Progress Notes (Signed)
Rebecca Wagner. [name] [phone]  LEILI KAYLOR - 58 y.o. female MRN [id number]  Date of birth: 08-26-1960  Visit Date: September 22, 2018  PCP: Leilani Able, MD   Referred by: Leilani Able, MD  SUBJECTIVE:  Chief Complaint  Patient presents with  . Left Hip - Follow-up  . Right Hip - Follow-up    XR L-spine and R hip 05/06/2018. R hip injection 07/20/2018. Ambulating with cane. Has tried Tylenol, Pennsaid, Gabapentin 400 mg BID, Tramadol, and Cyclobenzaprine. Has been provided with HEP. Has completed PT.     HPI: Patient is here for follow-up of the above symptoms.  She is continued to have some twinges in the bilateral hips and gluteal regions but overall is reporting good improvement in her symptoms.  Unfortunately she was hospitalized due to hypertensive emergency and gastrointestinal complaints and had some deconditioning over this timeframe.  She reports the pain is maybe a 2 to a 3 out of 10 compared to in the past.  Left leg is worse than the right.  She is continue to take gabapentin using Voltaren and using a walker intermittently with a cane on a regular basis.  Occasionally she is taking tramadol as well as cyclobenzaprine.  The gabapentin did cause sedation and she has cut back on this some.  REVIEW OF SYSTEMS: No significant nighttime awakenings due to this issue. Denies fevers, chills, recent weight gain or weight loss.  No night sweats.  Pt denies any change in bowel or bladder habits, numbness or falls associated with this pain. She does report weakness in the left greater than right leg.  HISTORY:  Prior history reviewed and updated per electronic medical record.  Patient Active Problem List   Diagnosis Date Noted  . Type 2 diabetes mellitus with vascular disease (HCC) 08/23/2018  . ARF (acute renal failure) (HCC) 08/23/2018  . Elevated troponin 08/23/2018  . Hypertensive urgency 08/23/2018  .  Greater trochanteric pain syndrome 06/26/2018    05/06/18 XR L-spine and R hip  IMPRESSION: Chronic severe L3-L4 disc and endplate degeneration. No acute osseous abnormality identified.  IMPRESSION: 1. Normal for age radiographic appearance of the right hip. 2. Calcified atherosclerosis.   . Nondisplaced fracture of navicular bone of left foot with routine healing 12/21/2016  . Closed displaced fracture of navicular bone of left foot 12/12/2016  . Essential hypertension, benign 05/06/2016  . Left Frontal-Parietal CVA 04/11/2016  . ST elevation - in setting of CVA & HTN Emergency - NOT MI 04/11/2016  . Multiple vessel coronary artery disease 04/11/2016      Prox RCA to Dist RCA lesion, 95 %stenosed.  Dist Cx lesion, 90 %stenosed.  Prox Cx lesion, 70 %stenosed.  Mid LAD to Dist LAD lesion, 99 %stenosed.  Dist LAD lesion, 99 %stenosed.  Ost 2nd Diag to 2nd Diag lesion, 90 %stenosed.  1st Diag lesion, 90 %stenosed.  Ost 3rd Mrg lesion, 99 %stenosed.  Ost 4th Mrg lesion, 70 %stenosed.  There is mild to moderate left ventricular systolic dysfunction.  LV end diastolic pressure is mildly elevated.  The left ventricular ejection fraction is 35-45% by visual estimate.   1. Severe, diffuse, heavily calcified 3 vessel disease in a typical diabetic pattern affecting branches and distal vessels. No clear culprit for unstable angina or myocardial infarction. 2. Mild to moderately decreased LV systolic function with an ejection fraction of 40% with akinesis of the apical segments. The pattern is suggestive  of stress-induced cardiomyopathy. However, LAD ischemia cannot be excluded given that the LAD wraps around the apex. 3. Mildly elevated left ventricular end-diastolic pressure.  Recommendations: No clear culprit for unstable angina is identified. It's possible that the patient developed flash pulmonary edema in the setting of uncontrolled hypertension with underlying three-vessel  coronary artery disease. Recommend blood pressure control with nitroglycerin drip. The LVEDP was actually close to normal when her blood pressure was controlled after she was given one dose of labetalol. I'm still concerned about a possible neurologic event given her labile blood pressure. The patient has no good options for revascularization of her coronary artery disease given poor targets and diffuse disease.   . Diabetes mellitus type 2, insulin dependent (HCC) 04/11/2016  . Acute respiratory failure with hypoxia (HCC) 04/11/2016  . Hyperlipidemia 04/11/2016  . Acute pulmonary edema (HCC)   . Hypertensive emergency   . Acute appendicitis 02/29/2016   Social History   Occupational History  . Not on file  Tobacco Use  . Smoking status: Former Games developer  . Smokeless tobacco: Never Used  . Tobacco comment: quit smoking in 2010  Substance and Sexual Activity  . Alcohol use: No  . Drug use: No  . Sexual activity: Yes    Birth control/protection: Post-menopausal   Social History   Social History Narrative  . Not on file    OBJECTIVE:  VS:  HT:5\' 4"  (162.6 cm)   WT:211 lb 9.6 oz (96 kg)  BMI:36.3    BP:(!) 148/86  HR:72bpm  TEMP: ( )  RESP:97 %   PHYSICAL EXAM: Adult female. No acute distress.  Alert and appropriate. She walks with a cane.  She has moderate difficulty with bed mobility but is able to perform straight leg raise against gravity bilaterally.  She has weakness with hip abduction worse on the left than the right.  Focal tenderness over the greater trochanters bilaterally.   ASSESSMENT:  1. Difficulty walking   2. Weakness of both hips   3. Gait disturbance   4. Greater trochanteric pain syndrome     PROCEDURES:  None  PLAN:  Pertinent additional documentation may be included in corresponding procedure notes, imaging studies, problem based documentation and patient instructions.  No problem-specific Assessment & Plan notes found for this encounter.    She has continued to show some improvements.  Unfortunately with COVID-19 crisis physical therapy is not an option at this time and we will plan to have her continue working on home therapeutic exercises we discussed these with her including hip abduction and leg lifts and straight leg raises.  No indication for repeat injection at this time given overall moderate improvement.  Home Therapeutic Exercises: Continue previously prescribed home exercise program  Activity modifications and the importance of avoiding exacerbating activities (limiting pain to no more than a 4 / 10 during or following activity) recommended and discussed.   Discussed red flag symptoms that warrant earlier emergent evaluation and patient voices understanding.    No orders of the defined types were placed in this encounter.  Lab Orders  No laboratory test(s) ordered today   Imaging Orders  No imaging studies ordered today   Referral Orders  No referral(s) requested today     Return in about 8 weeks (around 11/17/2018) for repeat clinical exam.          Andrena Mews, DO    [facility]

## 2018-10-08 MED FILL — FREESTYLE LIBRE 14 DAY SENS: 28 days supply | Qty: 2 | Fill #0

## 2018-10-12 DIAGNOSIS — I10 Essential (primary) hypertension: Secondary | ICD-10-CM | POA: Diagnosis not present

## 2018-10-12 DIAGNOSIS — E1121 Type 2 diabetes mellitus with diabetic nephropathy: Secondary | ICD-10-CM | POA: Diagnosis not present

## 2018-10-12 DIAGNOSIS — M545 Low back pain: Secondary | ICD-10-CM | POA: Diagnosis not present

## 2018-10-12 MED FILL — traMADol HCL 50 MG TABS: 50 | 30 days supply | Qty: 120 | Fill #0

## 2018-10-29 MED FILL — CARTIA XT 180 MG CAPSULE SA: 180 | 90 days supply | Qty: 90 | Fill #0

## 2018-10-29 MED FILL — cloNIDine HCL 0.2 MG TABS: 0.2 | 30 days supply | Qty: 60 | Fill #0

## 2018-10-29 MED FILL — FREESTYLE LIBRE 14 DAY SENS: 28 days supply | Qty: 2 | Fill #1

## 2018-11-17 MED FILL — traMADol HCL 50 MG TABS: 50 | 30 days supply | Qty: 120 | Fill #1

## 2018-11-20 ENCOUNTER — Ambulatory Visit: Payer: 59 | Admitting: Sports Medicine

## 2018-11-21 MED FILL — LANTUS SOLOSTAR 100 UNITS/M: 100 | 37 days supply | Qty: 15 | Fill #0

## 2018-11-26 ENCOUNTER — Ambulatory Visit: Payer: 59 | Admitting: Family Medicine

## 2018-12-17 ENCOUNTER — Encounter: Payer: Self-pay | Admitting: Family Medicine

## 2018-12-17 ENCOUNTER — Other Ambulatory Visit (INDEPENDENT_AMBULATORY_CARE_PROVIDER_SITE_OTHER): Payer: 59

## 2018-12-17 ENCOUNTER — Ambulatory Visit: Payer: 59 | Admitting: Family Medicine

## 2018-12-17 ENCOUNTER — Other Ambulatory Visit: Payer: Self-pay

## 2018-12-17 VITALS — BP 130/90 | HR 81 | Ht 64.0 in | Wt 211.0 lb

## 2018-12-17 DIAGNOSIS — R2689 Other abnormalities of gait and mobility: Secondary | ICD-10-CM

## 2018-12-17 DIAGNOSIS — R262 Difficulty in walking, not elsewhere classified: Secondary | ICD-10-CM | POA: Insufficient documentation

## 2018-12-17 LAB — COMPREHENSIVE METABOLIC PANEL
ALT: 11 U/L (ref 0–35)
AST: 10 U/L (ref 0–37)
Albumin: 3.8 g/dL (ref 3.5–5.2)
Alkaline Phosphatase: 76 U/L (ref 39–117)
BUN: 16 mg/dL (ref 6–23)
CO2: 27 mEq/L (ref 19–32)
Calcium: 9.2 mg/dL (ref 8.4–10.5)
Chloride: 101 mEq/L (ref 96–112)
Creatinine, Ser: 0.97 mg/dL (ref 0.40–1.20)
GFR: 70.96 mL/min (ref >60.00)
Glucose, Bld: 296 mg/dL — ABNORMAL HIGH (ref 70–99)
Potassium: 3.9 mEq/L (ref 3.5–5.1)
Sodium: 135 mEq/L (ref 135–145)
Total Bilirubin: 0.4 mg/dL (ref 0.2–1.2)
Total Protein: 7.2 g/dL (ref 6.0–8.3)

## 2018-12-17 LAB — CBC WITH DIFFERENTIAL/PLATELET
Basophils Absolute: 0.1 10*3/uL (ref 0.0–0.1)
Basophils Relative: 1 % (ref 0.0–3.0)
Eosinophils Absolute: 0.2 10*3/uL (ref 0.0–0.7)
Eosinophils Relative: 2.8 % (ref 0.0–5.0)
HCT: 40.7 % (ref 36.0–46.0)
Hemoglobin: 13.7 g/dL (ref 12.0–15.0)
Lymphocytes Relative: 26.4 % (ref 12.0–46.0)
Lymphs Abs: 1.5 10*3/uL (ref 0.7–4.0)
MCHC: 33.6 g/dL (ref 30.0–36.0)
MCV: 89 fl (ref 78.0–100.0)
Monocytes Absolute: 0.5 10*3/uL (ref 0.1–1.0)
Monocytes Relative: 8 % (ref 3.0–12.0)
Neutro Abs: 3.5 10*3/uL (ref 1.4–7.7)
Neutrophils Relative %: 61.8 % (ref 43.0–77.0)
Platelets: 253 10*3/uL (ref 150.0–400.0)
RBC: 4.57 Mil/uL (ref 3.87–5.11)
RDW: 13 % (ref 11.5–15.5)
WBC: 5.7 10*3/uL (ref 4.0–10.5)

## 2018-12-17 LAB — IBC PANEL
Iron: 63 ug/dL (ref 42–145)
Saturation Ratios: 23.7 % (ref 20.0–50.0)
Transferrin: 190 mg/dL — ABNORMAL LOW (ref 212.0–360.0)

## 2018-12-17 LAB — TSH: TSH: 1.4 u[IU]/mL (ref 0.35–4.50)

## 2018-12-17 LAB — C-REACTIVE PROTEIN: CRP: <1 mg/dL (ref 0.5–20.0)

## 2018-12-17 LAB — FERRITIN: Ferritin: 150.4 ng/mL (ref 10.0–291.0)

## 2018-12-17 LAB — VITAMIN B12: Vitamin B-12: 964 pg/mL — ABNORMAL HIGH (ref 211–911)

## 2018-12-17 LAB — SEDIMENTATION RATE: Sed Rate: 67 mm/hr — ABNORMAL HIGH (ref 0–30)

## 2018-12-17 LAB — URIC ACID: Uric Acid, Serum: 4.4 mg/dL (ref 2.4–7.0)

## 2018-12-17 NOTE — Assessment & Plan Note (Signed)
Patient does have difficulty walking.  I think it is multifactorial.  Patient has had a CVA previously.  Patient also has significant diabetes that is not well controlled.  Patient Reviewing labs show some hypocalcemia and would like to recheck laboratory work-up to make sure there is nothing else that is patient knee pain we will need to make with medications that discontinued.  Patient was making some progress with formal physical therapy and we will refer her again.  I like to hold on medications and pain with gait more laboratory work-up.  Patient will follow-up with me again 4 to 6 weeks.

## 2018-12-17 NOTE — Progress Notes (Signed)
[name] D.O. [facility] [address] [place], [place] [address] Phone: [phone] Subjective:   I [name] am serving as a Neurosurgeonscribe for Dr. Antoine PrimasZachary Hawkins Seaman.  CC: Bilateral hand pain weakness of the legs  BJY:[id number]:Subjective  Rebecca MeuseValerie S Wagner is a 58 y.o. female coming in with complaint of bilateral hip pain. States that she is afraid to walk with her cane due to balance. States that she only has pain in the mornings. Takes oral meds.  Past medical history significant  of CVA.  Patient has been seen another provider and has been going to formal physical therapy Significantly.  Patient has had unsteadiness of her feet.  States that she no longer has any significant pain.  Patient is feels like she is not making significant progress.  Patient would like to be able to walk with her walker but is unable to do so.  Is using a cane on a more regular basis.     Past Medical History:  Diagnosis Date  . Arthritis   . Depression   . Diabetes mellitus without complication (HCC)   . Hypertension    Past Surgical History:  Procedure Laterality Date  . CARDIAC CATHETERIZATION N/A 04/10/2016   Procedure: Left Heart Cath and Coronary Angiography;  Surgeon: Iran OuchMuhammad A Arida, MD;  Location: [facility] INVASIVE CV LAB;  Service: Cardiovascular;  Laterality: N/A;  . CESAREAN SECTION    . LAPAROSCOPIC APPENDECTOMY N/A 02/29/2016   Procedure: LAPAROSCOPIC APPENDECTOMY;  Surgeon: Axel FillerArmando Ramirez, MD;  Location: [facility] OR;  Service: General;  Laterality: N/A;   Social History   Socioeconomic History  . Marital status: Divorced    Spouse name: Not on file  . Number of children: Not on file  . Years of education: Not on file  . Highest education level: Not on file  Occupational History  . Not on file  Social Needs  . Financial resource strain: Not on file  . Food insecurity    Worry: Not on file    Inability: Not on file  . Transportation needs    Medical: Not on file    Non-medical: Not on  file  Tobacco Use  . Smoking status: Former Games developermoker  . Smokeless tobacco: Never Used  . Tobacco comment: quit smoking in 2010  Substance and Sexual Activity  . Alcohol use: No  . Drug use: No  . Sexual activity: Yes    Birth control/protection: Post-menopausal  Lifestyle  . Physical activity    Days per week: Not on file    Minutes per session: Not on file  . Stress: Not on file  Relationships  . Social Musicianconnections    Talks on phone: Not on file    Gets together: Not on file    Attends religious service: Not on file    Active member of club or organization: Not on file    Attends meetings of clubs or organizations: Not on file    Relationship status: Not on file  Other Topics Concern  . Not on file  Social History Narrative  . Not on file   Allergies  Allergen Reactions  . Eggs Or Egg-Derived Products Nausea And Vomiting  . Metformin And Related Diarrhea and Other (See Comments)    Extreme gas, also   Family History  Problem Relation Age of Onset  . Diabetes Mother   . Heart disease Father        onset late 4850s    Current Outpatient Medications (Endocrine &  Metabolic):  .  insulin glargine (LANTUS) 100 UNIT/ML injection, Inject 0.1 mLs (10 Units total) into the skin daily. (Patient taking differently: Inject 40 Units into the skin at bedtime. ) .  insulin lispro (HUMALOG) 100 UNIT/ML injection, Inject 5-10 Units into the skin 3 (three) times daily before meals.   Current Outpatient Medications (Cardiovascular):  .  Candesartan Cilexetil-HCTZ 32-25 MG TABS, Take 0.5 mg by mouth daily. .  carvedilol (COREG) 25 MG tablet, Take 25 mg by mouth 2 (two) times daily with a meal. .  diltiazem (CARDIZEM LA) 180 MG 24 hr tablet, Take 180 mg by mouth daily.  .  pravastatin (PRAVACHOL) 20 MG tablet, Take 20 mg by mouth at bedtime.  .  cloNIDine (CATAPRES) 0.2 MG tablet, Take 1 tablet (0.2 mg total) by mouth 2 (two) times daily for 30 days. .  hydrALAZINE (APRESOLINE) 25 MG  tablet, Take 1 tablet (25 mg total) by mouth every 8 (eight) hours for 30 days.   Current Outpatient Medications (Analgesics):  .  acetaminophen (TYLENOL) 325 MG tablet, Take 2 tablets (650 mg total) by mouth every 6 (six) hours as needed for fever. (Patient taking differently: Take 650 mg by mouth every 6 (six) hours as needed (for pain, headaches, or fever). ) .  aspirin 81 MG chewable tablet, Chew 1 tablet (81 mg total) by mouth daily. .  traMADol (ULTRAM) 50 MG tablet, Take 50 mg by mouth every 8 (eight) hours as needed (for pain).   Current Outpatient Medications (Hematological):  .  clopidogrel (PLAVIX) 75 MG tablet, Take 1 tablet (75 mg total) daily by mouth. NEED OV. (Patient taking differently: Take 75 mg by mouth daily. )  Current Outpatient Medications (Other):  .  calcium carbonate (OSCAL) 1500 (600 Ca) MG TABS tablet, Take 600 mg of elemental calcium by mouth daily. .  cyclobenzaprine (FLEXERIL) 10 MG tablet, Take 1 tablet (10 mg total) by mouth at bedtime. .  Diclofenac Sodium (PENNSAID) 2 % SOLN, Place 1 application onto the skin 2 (two) times daily. (Patient taking differently: Place 1 application onto the skin 2 (two) times daily. BOTH HIPS) .  famotidine (PEPCID) 20 MG tablet, Take 1 tablet (20 mg total) by mouth 2 (two) times daily. Marland Kitchen  gabapentin (NEURONTIN) 400 MG capsule, Take 400 mg by mouth 2 (two) times daily as needed (for hip pain).  .  Magnesium 250 MG TABS, Take 250 mg by mouth 2 (two) times daily.  .  ondansetron (ZOFRAN) 4 MG tablet, Take 1 tablet (4 mg total) by mouth every 6 (six) hours as needed for nausea.    Past medical history, social, surgical and family history all reviewed in electronic medical record.  No pertanent information unless stated regarding to the chief complaint.   Review of Systems:  No headache, visual changes, nausea, vomiting, diarrhea, constipation, dizziness, abdominal pain, skin rash, fevers, chills, night sweats, weight loss,  swollen lymph nodes, body aches, joint swelling,chest pain, shortness of breath, mood changes.  Positive muscle aches  Objective  Blood pressure 130/90, pulse 81, height 5\' 4"  (1.626 m), weight 211 lb (95.7 kg), SpO2 97 %.    General: No apparent distress alert and oriented x3 mood and affect normal, dressed appropriately.  Overweight HEENT: Pupils equal, extraocular movements intact  Respiratory: Patient's speak in full sentences and does not appear short of breath patient does speak fairly slow Cardiovascular: 1+ lower extremity edema, non tender, no erythema  Skin: Warm dry intact with no signs of infection  or rash on extremities or on axial skeleton.  Abdomen: Soft nontender  Neuro: Cranial nerves II through XII are intact, neurovascularly intact in all extremities with 2+ DTRs and 2+ pulses.  Lymph: No lymphadenopathy of posterior or anterior cervical chain or axillae bilaterally.  Gait severely antalgic using the walker MSK:  tender with limited range of motion and stability and symmetric strength and tone of shoulders, elbows, wrist, hip, knee and ankles bilaterally.  Patient does have some mild weakness in the little bit of her tract on the right lower extremity.  Mild weakness noted of the right lower extremity seems to be at baseline.  Patient does appear to be neurovascularly intact.  Deep tendon reflexes do appear to be intact as well.    Impression and Recommendations:     This case required medical decision making of moderate complexity. The above documentation has been reviewed and is accurate and complete Judi SaaZachary M Brailon Don, DO       Note: This dictation was prepared with Dragon dictation along with smaller phrase technology. Any transcriptional errors that result from this process are unintentional.

## 2018-12-17 NOTE — Assessment & Plan Note (Signed)
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  I am concerned that this is more secondary to patient's CVA.  Patient has many different comorbidities that could be contributing laboratory work-up may be necessary.  With the weakness in her legs I think she would have difficulty continuing formal physical therapy encourage patient to start with aquatic physical therapy.  Patient did not want to do any other significant work-up at this time and did not want any other current medications.  Patient will start with a conservative approach and follow-up again in 4 weeks

## 2018-12-17 NOTE — Patient Instructions (Addendum)
Good to see you Labs downstairs PT [address]

## 2018-12-21 LAB — PTH, INTACT AND CALCIUM
Calcium: 9.4 mg/dL (ref 8.6–10.4)
PTH: 38 pg/mL (ref 14–64)

## 2018-12-21 LAB — ANTI-NUCLEAR AB-TITER (ANA TITER)
ANA TITER: 1:80 {titer} — ABNORMAL HIGH
ANA Titer 1: 1:80 {titer} — ABNORMAL HIGH

## 2018-12-21 LAB — VITAMIN D 1,25 DIHYDROXY
Vitamin D 1, 25 (OH)2 Total: 35 pg/mL (ref 18–72)
Vitamin D2 1, 25 (OH)2: <8 pg/mL
Vitamin D3 1, 25 (OH)2: 35 pg/mL

## 2018-12-21 LAB — CALCIUM, IONIZED: Calcium, Ion: 5.14 mg/dL (ref 4.8–5.6)

## 2018-12-21 LAB — CYCLIC CITRUL PEPTIDE ANTIBODY, IGG: Cyclic Citrullin Peptide Ab: 78 UNITS — ABNORMAL HIGH

## 2018-12-21 LAB — ANA: Anti Nuclear Antibody (ANA): POSITIVE — AB

## 2018-12-21 LAB — ANGIOTENSIN CONVERTING ENZYME: Angiotensin-Converting Enzyme: 48 U/L (ref 9–67)

## 2018-12-21 LAB — RHEUMATOID FACTOR: Rheumatoid fact SerPl-aCnc: <14 IU/mL (ref <14)

## 2018-12-21 NOTE — Progress Notes (Signed)
Called patient. Left a message.

## 2019-01-06 MED FILL — CARVEDILOL 25 MG TABLET: 25 | 90 days supply | Qty: 180 | Fill #0

## 2019-01-06 MED FILL — FREESTYLE LIBRE 14 DAY SENS: 28 days supply | Qty: 2 | Fill #2

## 2019-01-11 MED FILL — LANTUS SOLOSTAR 100 UNITS/M: 100 | 37 days supply | Qty: 15 | Fill #0

## 2019-01-22 MED FILL — CYCLOBENZAPRINE HCL 10 MG T: 10 | 90 days supply | Qty: 90 | Fill #0

## 2019-01-22 MED FILL — cloNIDine HCL 0.2 MG TABS: 0.2 | 30 days supply | Qty: 60 | Fill #0

## 2019-01-22 MED FILL — LANTUS SOLOSTAR 100 UNITS/M: 100 | 37 days supply | Qty: 15 | Fill #0

## 2019-01-25 MED FILL — hydrALAZINE HCL 25 MG TABS: 25 | 30 days supply | Qty: 90 | Fill #0

## 2019-01-29 ENCOUNTER — Ambulatory Visit: Payer: 59 | Admitting: Family Medicine

## 2019-03-02 NOTE — Progress Notes (Signed)
[facility] [address] [place], [place] [address] Phone: [phone] Subjective:   I, Rebecca Wagner, am serving as a scribe for Dr. Hulan Saas.  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  I am concerned that this is more secondary to patient's CVA.  Patient has many different comorbidities that could be contributing laboratory work-up may be necessary.  With the weakness in her legs I think she would have difficulty continuing formal physical therapy encourage patient to start with aquatic physical therapy.  Patient did not want to do any other significant work-up at this time and did not want any other current medications.  Patient will start with a conservative approach and follow-up again in 4 weeks  Patient does have difficulty walking.  I think it is multifactorial.  Patient has had a CVA previously.  Patient also has significant diabetes that is not well controlled.  Patient Reviewing labs show some hypocalcemia and would like to recheck laboratory work-up to make sure there is nothing else that is patient knee pain we will need to make with medications that discontinued.  Patient was making some progress with formal physical therapy and we will refer her again.  I like to hold on medications and pain with gait more laboratory work-up.  Patient will follow-up with me again 4 to 6 weeks.  Update 03/03/2019 Rebecca Wagner is a 58 y.o. female coming in with complaint of balance and walking disorder. Patient states that she only has pain in the morning. Using tylenol and topical ointment for pain. States she fluctuate between her cane and walker. [place] [address] 3 times in march. Aqua therapy never called.  Patient states that she continues to have the same difficulty.  Has been doing some old mild home exercises with some of vitamins.  Not doing them know  religiously.    Past Medical History:  Diagnosis Date  . Arthritis   . Depression   . Diabetes mellitus without complication ([facility])   . Hypertension    Past Surgical History:  Procedure Laterality Date  . CARDIAC CATHETERIZATION N/A 04/10/2016   Procedure: Left Heart Cath and Coronary Angiography;  Surgeon: Wellington Hampshire, MD;  Location: [facility];  Service: Cardiovascular;  Laterality: N/A;  . CESAREAN SECTION    . LAPAROSCOPIC APPENDECTOMY N/A 02/29/2016   Procedure: LAPAROSCOPIC APPENDECTOMY;  Surgeon: Ralene Ok, MD;  Location: [facility];  Service: General;  Laterality: N/A;   Social History   Socioeconomic History  . Marital status: Divorced    Spouse name: Not on file  . Number of children: Not on file  . Years of education: Not on file  . Highest education level: Not on file  Occupational History  . Not on file  Social Needs  . Financial resource strain: Not on file  . Food insecurity    Worry: Not on file    Inability: Not on file  . Transportation needs    Medical: Not on file    Non-medical: Not on file  Tobacco Use  . Smoking status: Former Research scientist (life sciences)  . Smokeless tobacco: Never Used  . Tobacco comment: quit smoking in 2010  Substance and Sexual Activity  . Alcohol use: No  . Drug use: No  . Sexual activity: Yes    Birth control/protection: Post-menopausal  Lifestyle  . Physical activity    Days  per week: Not on file    Minutes per session: Not on file  . Stress: Not on file  Relationships  . Social Musician on phone: Not on file    Gets together: Not on file    Attends religious service: Not on file    Active member of club or organization: Not on file    Attends meetings of clubs or organizations: Not on file    Relationship status: Not on file  Other Topics Concern  . Not on file  Social History Narrative  . Not on file   Allergies  Allergen Reactions  . Eggs Or Egg-Derived Products Nausea And Vomiting  . Metformin And  Related Diarrhea and Other (See Comments)    Extreme gas, also   Family History  Problem Relation Age of Onset  . Diabetes Mother   . Heart disease Father        onset late 42s    Current Outpatient Medications (Endocrine & Metabolic):  .  insulin glargine (LANTUS) 100 UNIT/ML injection, Inject 0.1 mLs (10 Units total) into the skin daily. (Patient taking differently: Inject 40 Units into the skin at bedtime. ) .  insulin lispro (HUMALOG) 100 UNIT/ML injection, Inject 5-10 Units into the skin 3 (three) times daily before meals.   Current Outpatient Medications (Cardiovascular):  .  Candesartan Cilexetil-HCTZ 32-25 MG TABS, Take 0.5 mg by mouth daily. .  carvedilol (COREG) 25 MG tablet, Take 25 mg by mouth 2 (two) times daily with a meal. .  diltiazem (CARDIZEM LA) 180 MG 24 hr tablet, Take 180 mg by mouth daily.  .  pravastatin (PRAVACHOL) 20 MG tablet, Take 20 mg by mouth at bedtime.  .  cloNIDine (CATAPRES) 0.2 MG tablet, Take 1 tablet (0.2 mg total) by mouth 2 (two) times daily for 30 days. .  hydrALAZINE (APRESOLINE) 25 MG tablet, Take 1 tablet (25 mg total) by mouth every 8 (eight) hours for 30 days.   Current Outpatient Medications (Analgesics):  .  acetaminophen (TYLENOL) 325 MG tablet, Take 2 tablets (650 mg total) by mouth every 6 (six) hours as needed for fever. (Patient taking differently: Take 650 mg by mouth every 6 (six) hours as needed (for pain, headaches, or fever). ) .  aspirin 81 MG chewable tablet, Chew 1 tablet (81 mg total) by mouth daily. .  traMADol (ULTRAM) 50 MG tablet, Take 50 mg by mouth every 8 (eight) hours as needed (for pain).   Current Outpatient Medications (Hematological):  .  clopidogrel (PLAVIX) 75 MG tablet, Take 1 tablet (75 mg total) daily by mouth. NEED OV. (Patient taking differently: Take 75 mg by mouth daily. )  Current Outpatient Medications (Other):  .  calcium carbonate (OSCAL) 1500 (600 Ca) MG TABS tablet, Take 600 mg of elemental  calcium by mouth daily. .  cyclobenzaprine (FLEXERIL) 10 MG tablet, Take 1 tablet (10 mg total) by mouth at bedtime. .  Diclofenac Sodium (PENNSAID) 2 % SOLN, Place 1 application onto the skin 2 (two) times daily. (Patient taking differently: Place 1 application onto the skin 2 (two) times daily. BOTH HIPS) .  famotidine (PEPCID) 20 MG tablet, Take 1 tablet (20 mg total) by mouth 2 (two) times daily. Marland Kitchen  gabapentin (NEURONTIN) 400 MG capsule, Take 400 mg by mouth 2 (two) times daily as needed (for hip pain).  .  Magnesium 250 MG TABS, Take 250 mg by mouth 2 (two) times daily.  .  ondansetron (ZOFRAN) 4 MG  tablet, Take 1 tablet (4 mg total) by mouth every 6 (six) hours as needed for nausea.    Past medical history, social, surgical and family history all reviewed in electronic medical record.  No pertanent information unless stated regarding to the chief complaint.   Review of Systems:  No headache, visual changes, nausea, vomiting, diarrhea, constipation, dizziness, abdominal pain, skin rash, fevers, chills, night sweats, weight loss, swollen lymph nodes, body aches, joint swelling, muscle aches, chest pain, shortness of breath, mood changes.   Objective  Blood pressure (!) 140/100, pulse 84, height 5\' 4"  (1.626 m), weight 211 lb (95.7 kg), SpO2 98 %.    General: No apparent distress alert and oriented x3 mood and affect normal, dressed appropriately.  HEENT: Pupils equal, extraocular movements intact  Respiratory: Patient's speak in full sentences and does not appear short of breath  Cardiovascular: No lower extremity edema, non tender, no erythema  Skin: Warm dry intact with no signs of infection or rash on extremities or on axial skeleton.  Abdomen: Soft nontender  Neuro: Cranial nerves II through XII are intact, neurovascularly intact in all extremities  Lymph: No lymphadenopathy of posterior or anterior cervical chain or axillae bilaterally.  Gait severely antalgic MSK:  tender with  mild arthritic changes.  Patient does have weakness noted right lower extremity.  Patient does have decreasing deep tendon reflex on the right side of the lower extremity.  Mild peripheral neuropathy noted but seems to be bilateral.     Impression and Recommendations:     This case required medical decision making of moderate complexity. The above documentation has been reviewed and is accurate and complete Judi SaaZachary M , DO       Note: This dictation was prepared with Dragon dictation along with smaller phrase technology. Any transcriptional errors that result from this process are unintentional.

## 2019-03-03 ENCOUNTER — Encounter: Payer: Self-pay | Admitting: Family Medicine

## 2019-03-03 ENCOUNTER — Ambulatory Visit: Payer: 59 | Admitting: Family Medicine

## 2019-03-03 ENCOUNTER — Other Ambulatory Visit: Payer: Self-pay

## 2019-03-03 VITALS — BP 140/100 | HR 84 | Ht 64.0 in | Wt 211.0 lb

## 2019-03-03 DIAGNOSIS — R262 Difficulty in walking, not elsewhere classified: Secondary | ICD-10-CM

## 2019-03-03 DIAGNOSIS — R2689 Other abnormalities of gait and mobility: Secondary | ICD-10-CM | POA: Diagnosis not present

## 2019-03-03 NOTE — Assessment & Plan Note (Addendum)
Difficulty walking.  Discussed posture ergonomics, patient continues to use a cane for balance.  We discussed with patient patient did not do any formal physical therapy.  Encourage patient to try the aquatic therapy which he never did we also discussed with a positive on positive CCP on the increase segmentation rate and a family history of rheumatoid arthritis patient needs further evaluation by rheumatology follow-up with me again in 6 weeks after therapy

## 2019-03-03 NOTE — Patient Instructions (Addendum)
Good to see you PT should be calling you soon as well as Rheumatology  See me again in 7-8 weeks

## 2019-03-05 ENCOUNTER — Other Ambulatory Visit: Payer: Self-pay | Admitting: *Deleted

## 2019-03-05 MED ORDER — DICLOFENAC SODIUM 1 % TD GEL: TRANSDERMAL | 1 refills | Status: AC

## 2019-03-05 MED FILL — CARTIA XT 180 MG CAPSULE SA: 180 | 90 days supply | Qty: 90 | Fill #1

## 2019-03-05 MED FILL — hydrALAZINE HCL 25 MG TABS: 25 | 30 days supply | Qty: 90 | Fill #0

## 2019-03-12 MED FILL — DICLOFENAC SODIUM 1 % GEL: 1 | 14 days supply | Qty: 100 | Fill #0

## 2019-04-21 ENCOUNTER — Ambulatory Visit: Payer: 59 | Admitting: Family Medicine

## 2019-04-28 ENCOUNTER — Ambulatory Visit: Payer: 59 | Admitting: Family Medicine

## 2019-04-28 ENCOUNTER — Encounter: Payer: Self-pay | Admitting: Family Medicine

## 2019-04-28 ENCOUNTER — Other Ambulatory Visit: Payer: Self-pay

## 2019-04-28 VITALS — BP 140/70 | HR 72 | Ht 64.0 in | Wt 211.0 lb

## 2019-04-28 DIAGNOSIS — R2689 Other abnormalities of gait and mobility: Secondary | ICD-10-CM | POA: Diagnosis not present

## 2019-04-28 NOTE — Progress Notes (Signed)
Rebecca Wagner, Rebecca Wagner Subjective:   I Rebecca NighKana Wagner am serving as a Neurosurgeonscribe for Dr. Antoine PrimasZachary Lyzette Reinhardt.  I'm seeing this patient by the request  of:    CC: Balance disorder.  OZH:[id number]:Subjective   03/03/2019 Difficulty walking.  Discussed posture ergonomics, patient continues to use a cane for balance.  We discussed with patient patient did not do any formal physical therapy.  Encourage patient to try the aquatic therapy which he never did we also discussed with a positive on positive CCP on the increase segmentation rate and a family history of rheumatoid arthritis patient needs further evaluation by rheumatology follow-up with me again in 6 weeks after therapy  Uupdate 04/28/2019 Stevphen MeuseValerie S Skalicky is a 58 y.o. female coming in with complaint of balance problems. Patient states she is still having issues with balance.  History of a CVA, history of back arthritis, feels a little still unsteady on her legs.  Patient denies of any pain in the legs or the back, denies any true numbness or weakness just feels like she may fall again.      Past Medical History:  Diagnosis Date  . Arthritis   . Depression   . Diabetes mellitus without complication ([facility])   . Hypertension    Past Surgical History:  Procedure Laterality Date  . CARDIAC CATHETERIZATION N/A 04/10/2016   Procedure: Left Heart Cath and Coronary Angiography;  Surgeon: Iran OuchMuhammad A Arida, MD;  Location: [facility];  Service: Cardiovascular;  Laterality: N/A;  . CESAREAN SECTION    . LAPAROSCOPIC APPENDECTOMY N/A 02/29/2016   Procedure: LAPAROSCOPIC APPENDECTOMY;  Surgeon: Axel FillerArmando Ramirez, MD;  Location: [facility];  Service: General;  Laterality: N/A;   Social History   Socioeconomic History  . Marital status: Divorced    Spouse name: Not on file  . Number of children: Not on file  . Years of education: Not on file  . Highest education level: Not on file   Occupational History  . Not on file  Social Needs  . Financial resource strain: Not on file  . Food insecurity    Worry: Not on file    Inability: Not on file  . Transportation needs    Medical: Not on file    Non-medical: Not on file  Tobacco Use  . Smoking status: Former Games developermoker  . Smokeless tobacco: Never Used  . Tobacco comment: quit smoking in 2010  Substance and Sexual Activity  . Alcohol use: No  . Drug use: No  . Sexual activity: Yes    Birth control/protection: Post-menopausal  Lifestyle  . Physical activity    Days per week: Not on file    Minutes per session: Not on file  . Stress: Not on file  Relationships  . Social Musicianconnections    Talks on phone: Not on file    Gets together: Not on file    Attends religious service: Not on file    Active member of club or organization: Not on file    Attends meetings of clubs or organizations: Not on file    Relationship status: Not on file  Other Topics Concern  . Not on file  Social History Narrative  . Not on file   Allergies  Allergen Reactions  . Eggs Or Egg-Derived Products Nausea And Vomiting  . Metformin And Related Diarrhea and Other (See Comments)    Extreme gas, also   Family History  Problem  Relation Age of Onset  . Diabetes Mother   . Heart disease Father        onset late 52s    Current Outpatient Medications (Endocrine & Metabolic):  .  insulin glargine (LANTUS) 100 UNIT/ML injection, Inject 0.1 mLs (10 Units total) into the skin daily. (Patient taking differently: Inject 40 Units into the skin at bedtime. ) .  insulin lispro (HUMALOG) 100 UNIT/ML injection, Inject 5-10 Units into the skin 3 (three) times daily before meals.   Current Outpatient Medications (Cardiovascular):  .  Candesartan Cilexetil-HCTZ 32-25 MG TABS, Take 0.5 mg by mouth daily. .  carvedilol (COREG) 25 MG tablet, Take 25 mg by mouth 2 (two) times daily with a meal. .  diltiazem (CARDIZEM LA) 180 MG 24 hr tablet, Take 180 mg by  mouth daily.  .  pravastatin (PRAVACHOL) 20 MG tablet, Take 20 mg by mouth at bedtime.  .  cloNIDine (CATAPRES) 0.2 MG tablet, Take 1 tablet (0.2 mg total) by mouth 2 (two) times daily for 30 days. .  hydrALAZINE (APRESOLINE) 25 MG tablet, Take 1 tablet (25 mg total) by mouth every 8 (eight) hours for 30 days.   Current Outpatient Medications (Analgesics):  .  acetaminophen (TYLENOL) 325 MG tablet, Take 2 tablets (650 mg total) by mouth every 6 (six) hours as needed for fever. (Patient taking differently: Take 650 mg by mouth every 6 (six) hours as needed (for pain, headaches, or fever). ) .  aspirin 81 MG chewable tablet, Chew 1 tablet (81 mg total) by mouth daily. .  traMADol (ULTRAM) 50 MG tablet, Take 50 mg by mouth every 8 (eight) hours as needed (for pain).   Current Outpatient Medications (Hematological):  .  clopidogrel (PLAVIX) 75 MG tablet, Take 1 tablet (75 mg total) daily by mouth. NEED OV. (Patient taking differently: Take 75 mg by mouth daily. )  Current Outpatient Medications (Other):  .  calcium carbonate (OSCAL) 1500 (600 Ca) MG TABS tablet, Take 600 mg of elemental calcium by mouth daily. .  cyclobenzaprine (FLEXERIL) 10 MG tablet, Take 1 tablet (10 mg total) by mouth at bedtime. .  Diclofenac Sodium (PENNSAID) 2 % SOLN, Place 1 application onto the skin 2 (two) times daily. (Patient taking differently: Place 1 application onto the skin 2 (two) times daily. BOTH HIPS) .  diclofenac sodium (VOLTAREN) 1 % GEL, Apply topically to affected area TID .  famotidine (PEPCID) 20 MG tablet, Take 1 tablet (20 mg total) by mouth 2 (two) times daily. Marland Kitchen  gabapentin (NEURONTIN) 400 MG capsule, Take 400 mg by mouth 2 (two) times daily as needed (for hip pain).  .  Magnesium 250 MG TABS, Take 250 mg by mouth 2 (two) times daily.  .  ondansetron (ZOFRAN) 4 MG tablet, Take 1 tablet (4 mg total) by mouth every 6 (six) hours as needed for nausea.    Past medical history, social, surgical and  family history all reviewed in electronic medical record.  No pertanent information unless stated regarding to the chief complaint.   Review of Systems:  No headache, visual changes, nausea, vomiting, diarrhea, constipation, dizziness, abdominal pain, skin rash, fevers, chills, night sweats, weight loss, swollen lymph nodes, body aches, joint swelling,  chest pain, shortness of breath, mood changes.  Positive muscle aches  Objective  Blood pressure 140/70, pulse 72, height 5\' 4"  (1.626 m), weight 211 lb (95.7 kg), SpO2 97 %.    General: No apparent distress alert and oriented x3 mood and affect normal,  dressed appropriately.  HEENT: Pupils equal, extraocular movements intact  Respiratory: Patient's speak in full sentences and does not appear short of breath  Cardiovascular: No lower extremity edema, non tender, no erythema  Skin: Warm dry intact with no signs of infection or rash on extremities or on axial skeleton.  Abdomen: Soft nontender  Neuro: Cranial nerves II through XII are intact, neurovascularly intact in all extremities with and 2+ pulses.  Lymph: No lymphadenopathy of posterior or anterior cervical chain or axillae bilaterally.  Gait severely antalgic walking with the aid of a cane MSK:  tender with full range of motion and good stability and symmetric and tone of shoulders, elbows, wrist, hip, knee and ankles bilaterally.  Patient is severely antalgic.  Seems to have weakness of the pelvic girdle.  Weakness of the right lower extremity compared to the left still noted but closer to symmetric from previous exam.  1+ DTRs of the right lower extremity noted.  Mild peripheral neuropathy bilaterally.    Impression and Recommendations:     This case required medical decision making of moderate complexity. The above documentation has been reviewed and is accurate and complete Judi Saa, DO       Note: This dictation was prepared with Dragon dictation along with smaller  phrase technology. Any transcriptional errors that result from this process are unintentional.

## 2019-04-28 NOTE — Patient Instructions (Addendum)
Sent in another referral to PT See me again in 6-8 weeks Continue vitamins at this time  [facility] [phone] [address] [place], [place] [address]

## 2019-04-28 NOTE — Assessment & Plan Note (Addendum)
Patient continues to have the balance disorder, never did go to formal physical therapy.  Patient has multiple other comorbidities.  Patient did have a CVA that could be contributing and also has severe arthritic changes that would be the most concerning part there and could have more of a lumbar spinal stenosis.  Do not want to make any significant medication changes.  Follow-up again in 4 to 8 weeks spent  25 minutes with patient face-to-face and had greater than 50% of counseling including as described above in assessment and plan.  If no improvement I would consider an MRI of the lumbar spine as well as a nerve conduction study for peripheral neuropathy.  Patient on exam today does seem to be doing relatively well

## 2019-06-04 DIAGNOSIS — R2681 Unsteadiness on feet: Secondary | ICD-10-CM | POA: Diagnosis not present

## 2019-06-04 DIAGNOSIS — M545 Low back pain: Secondary | ICD-10-CM | POA: Diagnosis not present

## 2019-06-04 DIAGNOSIS — R2689 Other abnormalities of gait and mobility: Secondary | ICD-10-CM | POA: Diagnosis not present

## 2019-06-11 ENCOUNTER — Other Ambulatory Visit: Payer: Self-pay

## 2019-06-11 ENCOUNTER — Ambulatory Visit: Payer: 59 | Admitting: Family Medicine

## 2019-06-11 ENCOUNTER — Encounter: Payer: Self-pay | Admitting: Family Medicine

## 2019-06-11 VITALS — BP 132/90 | HR 82 | Ht 64.0 in | Wt 212.8 lb

## 2019-06-11 DIAGNOSIS — R2689 Other abnormalities of gait and mobility: Secondary | ICD-10-CM

## 2019-06-11 DIAGNOSIS — R262 Difficulty in walking, not elsewhere classified: Secondary | ICD-10-CM | POA: Diagnosis not present

## 2019-06-11 NOTE — Progress Notes (Signed)
[facility] [place], [place] [address] Phone: [phone] Subjective:    CC: Balance difficulty  RKY:[id number]    I, Wendy Poet, LAT, ATC, am serving as scribe for Dr. Hulan Saas.  04/28/19: Patient continues to have the balance disorder, never did go to formal physical therapy.  Patient has multiple other comorbidities.  Patient did have a CVA that could be contributing and also has severe arthritic changes that would be the most concerning part there and could have more of a lumbar spinal stenosis.  Do not want to make any significant medication changes.  Follow-up again in 4 to 8 weeks spent  25 minutes with patient face-to-face and had greater than 50% of counseling including as described above in assessment and plan.  If no improvement I would consider an MRI of the lumbar spine as well as a nerve conduction study for peripheral neuropathy.  Patient on exam today does seem to be doing relatively well  06/11/19: Rebecca Wagner is a 58 y.o. female coming in for f/u of balance problems.  She was referred to outpatient PT at her last visit.  Since her last visit, pt notes that she's still having a hard time w/ her balance and walking.  She has only been to PT x one visit.  She states that her manager would not let her to continue to attend PT until she completes her FMLA paperwork.  Pt does not have any paperwork for [place] to complete today.  Patient continues to walk with the aid of a cane.  He is a walker at work sometimes.  Patient has not fallen since we have seen her.  Has not seen her other doctor since she seen [place].     Past Medical History:  Diagnosis Date  . Arthritis   . Depression   . Diabetes mellitus without complication ([place])   . Hypertension    Past Surgical History:  Procedure Laterality Date  . CARDIAC CATHETERIZATION N/A 04/10/2016   Procedure: Left Heart Cath and Coronary Angiography;  Surgeon: Wellington Hampshire, MD;   Location: [facility];  Service: Cardiovascular;  Laterality: N/A;  . CESAREAN SECTION    . LAPAROSCOPIC APPENDECTOMY N/A 02/29/2016   Procedure: LAPAROSCOPIC APPENDECTOMY;  Surgeon: Ralene Ok, MD;  Location: [facility];  Service: General;  Laterality: N/A;   Social History   Socioeconomic History  . Marital status: Divorced    Spouse name: Not on file  . Number of children: Not on file  . Years of education: Not on file  . Highest education level: Not on file  Occupational History  . Not on file  Tobacco Use  . Smoking status: Former Research scientist (life sciences)  . Smokeless tobacco: Never Used  . Tobacco comment: quit smoking in 2010  Substance and Sexual Activity  . Alcohol use: No  . Drug use: No  . Sexual activity: Yes    Birth control/protection: Post-menopausal  Other Topics Concern  . Not on file  Social History Narrative  . Not on file   Social Determinants of Health   Financial Resource Strain:   . Difficulty of Paying Living Expenses: Not on file  Food Insecurity:   . Worried About Charity fundraiser in the Last Year: Not on file  . Ran Out of Food in the Last Year: Not on file  Transportation Needs:   . Lack of Transportation (Medical): Not on file  . Lack of Transportation (Non-Medical): Not  on file  Physical Activity:   . Days of Exercise per Week: Not on file  . Minutes of Exercise per Session: Not on file  Stress:   . Feeling of Stress : Not on file  Social Connections:   . Frequency of Communication with Friends and Family: Not on file  . Frequency of Social Gatherings with Friends and Family: Not on file  . Attends Religious Services: Not on file  . Active Member of Clubs or Organizations: Not on file  . Attends Banker Meetings: Not on file  . Marital Status: Not on file   Allergies  Allergen Reactions  . Eggs Or Egg-Derived Products Nausea And Vomiting  . Metformin And Related Diarrhea and Other (See Comments)    Extreme gas, also   Family  History  Problem Relation Age of Onset  . Diabetes Mother   . Heart disease Father        onset late 50s    Current Outpatient Medications (Endocrine & Metabolic):  .  insulin glargine (LANTUS) 100 UNIT/ML injection, Inject 0.1 mLs (10 Units total) into the skin daily. (Patient taking differently: Inject 40 Units into the skin at bedtime. ) .  insulin lispro (HUMALOG) 100 UNIT/ML injection, Inject 5-10 Units into the skin 3 (three) times daily before meals.   Current Outpatient Medications (Cardiovascular):  .  Candesartan Cilexetil-HCTZ 32-25 MG TABS, Take 0.5 mg by mouth daily. .  carvedilol (COREG) 25 MG tablet, Take 25 mg by mouth 2 (two) times daily with a meal. .  diltiazem (CARDIZEM LA) 180 MG 24 hr tablet, Take 180 mg by mouth daily.  .  pravastatin (PRAVACHOL) 20 MG tablet, Take 20 mg by mouth at bedtime.  .  cloNIDine (CATAPRES) 0.2 MG tablet, Take 1 tablet (0.2 mg total) by mouth 2 (two) times daily for 30 days. .  hydrALAZINE (APRESOLINE) 25 MG tablet, Take 1 tablet (25 mg total) by mouth every 8 (eight) hours for 30 days.   Current Outpatient Medications (Analgesics):  .  acetaminophen (TYLENOL) 325 MG tablet, Take 2 tablets (650 mg total) by mouth every 6 (six) hours as needed for fever. (Patient taking differently: Take 650 mg by mouth every 6 (six) hours as needed (for pain, headaches, or fever). ) .  aspirin 81 MG chewable tablet, Chew 1 tablet (81 mg total) by mouth daily. .  traMADol (ULTRAM) 50 MG tablet, Take 50 mg by mouth every 8 (eight) hours as needed (for pain).   Current Outpatient Medications (Hematological):  .  clopidogrel (PLAVIX) 75 MG tablet, Take 1 tablet (75 mg total) daily by mouth. NEED OV. (Patient taking differently: Take 75 mg by mouth daily. )  Current Outpatient Medications (Other):  .  calcium carbonate (OSCAL) 1500 (600 Ca) MG TABS tablet, Take 600 mg of elemental calcium by mouth daily. .  cyclobenzaprine (FLEXERIL) 10 MG tablet, Take 1  tablet (10 mg total) by mouth at bedtime. .  Diclofenac Sodium (PENNSAID) 2 % SOLN, Place 1 application onto the skin 2 (two) times daily. (Patient taking differently: Place 1 application onto the skin 2 (two) times daily. BOTH HIPS) .  diclofenac sodium (VOLTAREN) 1 % GEL, Apply topically to affected area TID .  famotidine (PEPCID) 20 MG tablet, Take 1 tablet (20 mg total) by mouth 2 (two) times daily. Marland Kitchen  gabapentin (NEURONTIN) 400 MG capsule, Take 400 mg by mouth 2 (two) times daily as needed (for hip pain).  .  Magnesium 250 MG TABS, Take  250 mg by mouth 2 (two) times daily.  .  ondansetron (ZOFRAN) 4 MG tablet, Take 1 tablet (4 mg total) by mouth every 6 (six) hours as needed for nausea.    Past medical history, social, surgical and family history all reviewed in electronic medical record.  No pertanent information unless stated regarding to the chief complaint.   Review of Systems:  No headache, visual changes, nausea, vomiting, diarrhea, constipation, dizziness, abdominal pain, skin rash, fevers, chills, night sweats, weight loss, swollen lymph nodes, body aches, joint swelling, , chest pain, shortness of breath, mood changes.  Positive muscle aches  Objective  Blood pressure 132/90, pulse 82, height 5\' 4"  (1.626 m), weight 212 lb 12.8 oz (96.5 kg), SpO2 98 %.    General: No apparent distress alert and oriented x3 mood and affect normal, dressed appropriately.  Mild swelling mentation HEENT: Pupils equal, extraocular movements intact  Respiratory: Patient's speak in full sentences and does not appear short of breath  Cardiovascular: 1+ lower extremity edema, non tender, no erythema  Skin: Warm dry intact with no signs of infection or rash on extremities or on axial skeleton.  Abdomen: Soft nontender obese Neuro: Cranial nerves II through XII are intact, neurovascularly intact in all extremities with 2+ DTRs and 2+ pulses.  Lymph: No lymphadenopathy of posterior or anterior cervical  chain or axillae bilaterally.  Gait severely antalgic with a wide-based gait MSK:  Patient continues have weakness of the right and left lower extremity but worse on the right.  Deep tendon reflexes 1+ on the right compared to the contralateral side.  Mild peripheral neuropathy bilaterally.   Impression and Recommendations:     This case required medical decision making of moderate complexity. The above documentation has been reviewed and is accurate and complete Judi SaaZachary M Eulalio Reamy, DO       Note: This dictation was prepared with Dragon dictation along with smaller phrase technology. Any transcriptional errors that result from this process are unintentional.

## 2019-06-11 NOTE — Assessment & Plan Note (Signed)
Patient continues to have significant weakness that seems to be still awaiting post stroke with balance disorder.  Has not been able to go to formal physical therapy.  Discussed with patient that I feel that she has made some progress but it is going to take more time.  Significantly encouraged to try physical therapy if she feels safe during the coronavirus outbreak.  Patient will see if she can do that but may need some FMLA paperwork filled out so she can only during work sometimes.  Follow-up again 6 to 8 weeks

## 2019-06-11 NOTE — Patient Instructions (Signed)
Please attend PT like we discussed.  Once weekly vitamin D for 12 weeks.  Turmeric 500mg  daily  Tart cherry extract 1200mg  at night Vitamin D 2000 IU daily   See me again in 8 weeks.  Happy Holidays.

## 2019-06-15 DIAGNOSIS — Z0000 Encounter for general adult medical examination without abnormal findings: Secondary | ICD-10-CM | POA: Diagnosis not present

## 2019-06-15 DIAGNOSIS — Z13220 Encounter for screening for lipoid disorders: Secondary | ICD-10-CM | POA: Diagnosis not present

## 2019-06-15 MED FILL — FREESTYLE LIBRE 14 DAY SENS: 28 days supply | Qty: 2 | Fill #0

## 2019-06-24 MED FILL — traMADol HCL 50 MG TABS: 50 | 30 days supply | Qty: 120 | Fill #0

## 2019-08-13 ENCOUNTER — Telehealth: Payer: Self-pay | Admitting: Family Medicine

## 2019-08-13 ENCOUNTER — Ambulatory Visit: Payer: 59 | Admitting: Family Medicine

## 2019-08-13 NOTE — Telephone Encounter (Signed)
Patient will be having [employer] fax over forms for [place] to complete.  Just a heads up.

## 2019-09-28 MED FILL — DILTIAZEM HCL ER COATED BEA: 180 | 90 days supply | Qty: 90 | Fill #2

## 2019-09-28 MED FILL — CARVEDILOL 25 MG TABLET: 25 | 90 days supply | Qty: 180 | Fill #1

## 2019-10-20 MED FILL — traMADol HCL 50 MG TABS: 50 | 30 days supply | Qty: 120 | Fill #0

## 2019-10-20 MED FILL — BASAGLAR 100 UNIT/ML KWIKPE: 100 | 25 days supply | Qty: 15 | Fill #0

## 2019-10-21 ENCOUNTER — Emergency Department (HOSPITAL_COMMUNITY)
Admission: EM | Admit: 2019-10-21 | Discharge: 2019-10-30 | Disposition: E | Payer: 59 | Attending: Emergency Medicine | Admitting: Emergency Medicine

## 2019-10-21 DIAGNOSIS — I129 Hypertensive chronic kidney disease with stage 1 through stage 4 chronic kidney disease, or unspecified chronic kidney disease: Secondary | ICD-10-CM | POA: Insufficient documentation

## 2019-10-21 DIAGNOSIS — N189 Chronic kidney disease, unspecified: Secondary | ICD-10-CM | POA: Diagnosis not present

## 2019-10-21 DIAGNOSIS — R4020 Unspecified coma: Secondary | ICD-10-CM | POA: Diagnosis not present

## 2019-10-21 DIAGNOSIS — I469 Cardiac arrest, cause unspecified: Secondary | ICD-10-CM | POA: Insufficient documentation

## 2019-10-21 DIAGNOSIS — Z794 Long term (current) use of insulin: Secondary | ICD-10-CM | POA: Diagnosis not present

## 2019-10-21 DIAGNOSIS — E1122 Type 2 diabetes mellitus with diabetic chronic kidney disease: Secondary | ICD-10-CM | POA: Diagnosis not present

## 2019-10-21 DIAGNOSIS — I499 Cardiac arrhythmia, unspecified: Secondary | ICD-10-CM | POA: Diagnosis not present

## 2019-10-21 DIAGNOSIS — R9431 Abnormal electrocardiogram [ECG] [EKG]: Secondary | ICD-10-CM | POA: Diagnosis not present

## 2019-10-21 DIAGNOSIS — R1111 Vomiting without nausea: Secondary | ICD-10-CM | POA: Diagnosis not present

## 2019-10-21 DIAGNOSIS — I462 Cardiac arrest due to underlying cardiac condition: Secondary | ICD-10-CM | POA: Diagnosis not present

## 2019-10-21 DIAGNOSIS — R404 Transient alteration of awareness: Secondary | ICD-10-CM | POA: Diagnosis not present

## 2019-10-21 DIAGNOSIS — R000 Tachycardia, unspecified: Secondary | ICD-10-CM | POA: Diagnosis not present

## 2019-10-30 DIAGNOSIS — 419620001 Death: Secondary | SNOMED CT | POA: Diagnosis not present

## 2019-10-30 NOTE — ED Notes (Signed)
Patient's belonging bag given to son, Rebecca Wagner.
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
## 2019-10-30 DEATH — deceased
